# Patient Record
Sex: Female | Born: 1960
Health system: Southern US, Community
[De-identification: ages and names within clinical notes are randomized; demographics above are authoritative.]

## PROBLEM LIST (undated history)

## (undated) DIAGNOSIS — F419 Anxiety disorder, unspecified: Secondary | ICD-10-CM

## (undated) DIAGNOSIS — F502 Bulimia nervosa: Secondary | ICD-10-CM

## (undated) DIAGNOSIS — E785 Hyperlipidemia, unspecified: Secondary | ICD-10-CM

## (undated) DIAGNOSIS — G43909 Migraine, unspecified, not intractable, without status migrainosus: Secondary | ICD-10-CM

## (undated) DIAGNOSIS — K589 Irritable bowel syndrome without diarrhea: Secondary | ICD-10-CM

## (undated) DIAGNOSIS — K259 Gastric ulcer, unspecified as acute or chronic, without hemorrhage or perforation: Secondary | ICD-10-CM

## (undated) DIAGNOSIS — F329 Major depressive disorder, single episode, unspecified: Secondary | ICD-10-CM

## (undated) DIAGNOSIS — Z9889 Other specified postprocedural states: Secondary | ICD-10-CM

## (undated) DIAGNOSIS — B9681 Helicobacter pylori [H. pylori] as the cause of diseases classified elsewhere: Secondary | ICD-10-CM

## (undated) HISTORY — DX: Gastric ulcer, unspecified as acute or chronic, without hemorrhage or perforation: K25.9

## (undated) HISTORY — DX: Migraine, unspecified, not intractable, without status migrainosus: G43.909

## (undated) HISTORY — PX: ESOPHAGOGASTRODUODENOSCOPY: SHX1529

## (undated) HISTORY — PX: COLONOSCOPY: SHX174

## (undated) HISTORY — DX: Major depressive disorder, single episode, unspecified: F32.9

## (undated) HISTORY — DX: Other specified postprocedural states: Z98.890

## (undated) HISTORY — PX: ABDOMINAL HYSTERECTOMY: SHX81

## (undated) HISTORY — DX: Hyperlipidemia, unspecified: E78.5

## (undated) HISTORY — DX: Helicobacter pylori (H. pylori) as the cause of diseases classified elsewhere: B96.81

## (undated) HISTORY — DX: Irritable bowel syndrome without diarrhea: K58.9

## (undated) HISTORY — DX: Bulimia nervosa: F50.2

## (undated) HISTORY — DX: Anxiety disorder, unspecified: F41.9

## (undated) HISTORY — DX: Irritable bowel syndrome, unspecified: K58.9

---

## 1995-11-30 DIAGNOSIS — F419 Anxiety disorder, unspecified: Secondary | ICD-10-CM

## 1995-11-30 DIAGNOSIS — F32A Depression, unspecified: Secondary | ICD-10-CM

## 1995-11-30 DIAGNOSIS — F502 Bulimia nervosa, unspecified: Secondary | ICD-10-CM

## 1995-11-30 HISTORY — DX: Depression, unspecified: F32.A

## 1995-11-30 HISTORY — DX: Bulimia nervosa: F50.2

## 1995-11-30 HISTORY — DX: Anxiety disorder, unspecified: F41.9

## 1995-11-30 HISTORY — DX: Bulimia nervosa, unspecified: F50.20

## 2004-01-11 ENCOUNTER — Emergency Department (HOSPITAL_COMMUNITY): Admission: EM | Admit: 2004-01-11 | Discharge: 2004-01-11 | Payer: Self-pay | Admitting: Emergency Medicine

## 2006-01-13 ENCOUNTER — Ambulatory Visit: Payer: Self-pay | Admitting: Obstetrics and Gynecology

## 2006-02-03 ENCOUNTER — Ambulatory Visit: Payer: Self-pay | Admitting: Obstetrics and Gynecology

## 2007-05-29 ENCOUNTER — Other Ambulatory Visit: Payer: Self-pay

## 2007-05-29 ENCOUNTER — Emergency Department: Payer: Self-pay | Admitting: Emergency Medicine

## 2007-07-21 LAB — HM PAP SMEAR: HM Pap smear: NORMAL

## 2007-09-24 ENCOUNTER — Emergency Department: Payer: Self-pay | Admitting: Emergency Medicine

## 2008-01-06 ENCOUNTER — Emergency Department: Payer: Self-pay | Admitting: Emergency Medicine

## 2008-07-21 ENCOUNTER — Emergency Department: Payer: Self-pay | Admitting: Emergency Medicine

## 2008-07-21 ENCOUNTER — Other Ambulatory Visit: Payer: Self-pay

## 2009-07-04 LAB — HM COLONOSCOPY: HM Colonoscopy: NORMAL

## 2010-11-29 DIAGNOSIS — Z9889 Other specified postprocedural states: Secondary | ICD-10-CM

## 2010-11-29 DIAGNOSIS — B9681 Helicobacter pylori [H. pylori] as the cause of diseases classified elsewhere: Secondary | ICD-10-CM

## 2010-11-29 HISTORY — DX: Gastric ulcer, unspecified as acute or chronic, without hemorrhage or perforation: B96.81

## 2010-11-29 HISTORY — DX: Other specified postprocedural states: Z98.890

## 2011-02-03 ENCOUNTER — Inpatient Hospital Stay: Payer: Self-pay | Admitting: Internal Medicine

## 2011-07-21 LAB — HM MAMMOGRAPHY: HM Mammogram: NORMAL

## 2011-08-24 ENCOUNTER — Ambulatory Visit: Payer: Self-pay | Admitting: Internal Medicine

## 2011-09-29 ENCOUNTER — Encounter: Payer: Self-pay | Admitting: Internal Medicine

## 2011-09-29 ENCOUNTER — Ambulatory Visit (INDEPENDENT_AMBULATORY_CARE_PROVIDER_SITE_OTHER): Payer: PRIVATE HEALTH INSURANCE | Admitting: Internal Medicine

## 2011-09-29 DIAGNOSIS — K589 Irritable bowel syndrome without diarrhea: Secondary | ICD-10-CM

## 2011-09-29 DIAGNOSIS — Z9889 Other specified postprocedural states: Secondary | ICD-10-CM

## 2011-09-29 DIAGNOSIS — Z8711 Personal history of peptic ulcer disease: Secondary | ICD-10-CM | POA: Insufficient documentation

## 2011-09-29 DIAGNOSIS — E669 Obesity, unspecified: Secondary | ICD-10-CM

## 2011-09-29 DIAGNOSIS — R5381 Other malaise: Secondary | ICD-10-CM

## 2011-09-29 DIAGNOSIS — B9681 Helicobacter pylori [H. pylori] as the cause of diseases classified elsewhere: Secondary | ICD-10-CM | POA: Insufficient documentation

## 2011-09-29 DIAGNOSIS — G43909 Migraine, unspecified, not intractable, without status migrainosus: Secondary | ICD-10-CM | POA: Insufficient documentation

## 2011-09-29 DIAGNOSIS — R5383 Other fatigue: Secondary | ICD-10-CM

## 2011-09-29 DIAGNOSIS — Z13220 Encounter for screening for lipoid disorders: Secondary | ICD-10-CM

## 2011-09-29 DIAGNOSIS — R232 Flushing: Secondary | ICD-10-CM

## 2011-09-29 DIAGNOSIS — Z8719 Personal history of other diseases of the digestive system: Secondary | ICD-10-CM | POA: Insufficient documentation

## 2011-09-29 DIAGNOSIS — N951 Menopausal and female climacteric states: Secondary | ICD-10-CM

## 2011-09-29 MED ORDER — ESTRADIOL 0.5 MG PO TABS: 0.5000 mg | ORAL_TABLET | Freq: Every day | ORAL | Status: DC

## 2011-09-29 NOTE — Progress Notes (Signed)
  Subjective:    Patient ID: Nichole Riddle, female    DOB: 11-13-1961, 50 y.o.   MRN: [id number]  HPI  50 yo white female with a history of hot flashes, ep[isodic, first occurring in her early 23s presents with 6 month history of hot flashes and night sweatswhich are accompaned by nausea and sleep disruption ,  Hot flashes are preceded by intense feeling of cold down to the bone. History of menstrual irregularity, had a hysterectomy to manage the symptoms and the resulting anemia.  Mother had hot flashes in her 30s as well. She has had several episodes over the last 30 yrs lasting moths at a time and then resolving spontaneously.  She reports frequent swings andrecurrence of migraine headaches and  was previously treated with celexa but not currently.   Past Medical History  Diagnosis Date  . Gastric ulcer 2012    NSAID induced   . History of cardiac catheterization 2012    normal   . Gastric ulcer due to Helicobacter pylori 2012    and NSAID use    No current outpatient prescriptions on file prior to visit.     Review of Systems  Constitutional: Negative for fever, chills and unexpected weight change.  HENT: Negative for hearing loss, ear pain, nosebleeds, congestion, sore throat, facial swelling, rhinorrhea, sneezing, mouth sores, trouble swallowing, neck pain, neck stiffness, voice change, postnasal drip, sinus pressure, tinnitus and ear discharge.   Eyes: Negative for pain, discharge, redness and visual disturbance.  Respiratory: Negative for cough, chest tightness, shortness of breath, wheezing and stridor.   Cardiovascular: Negative for chest pain, palpitations and leg swelling.  Musculoskeletal: Negative for myalgias and arthralgias.  Skin: Negative for color change and rash.  Neurological: Negative for dizziness, weakness, light-headedness and headaches.  Hematological: Negative for adenopathy.  Psychiatric/Behavioral: Positive for sleep disturbance, dysphoric mood and decreased  concentration.       Objective:   Physical Exam  Constitutional: She is oriented to person, place, and time. She appears well-developed and well-nourished.  HENT:  Mouth/Throat: Oropharynx is clear and moist.  Eyes: EOM are normal. Pupils are equal, round, and reactive to light. No scleral icterus.  Neck: Normal range of motion. Neck supple. No JVD present. No thyromegaly present.  Cardiovascular: Normal rate, regular rhythm, normal heart sounds and intact distal pulses.   Pulmonary/Chest: Effort normal and breath sounds normal.  Abdominal: Soft. Bowel sounds are normal. She exhibits no mass. There is no tenderness.  Musculoskeletal: Normal range of motion. She exhibits no edema.  Lymphadenopathy:    She has no cervical adenopathy.  Neurological: She is alert and oriented to person, place, and time.  Skin: Skin is warm and dry.  Psychiatric: She has a normal mood and affect.          Assessment & Plan:

## 2011-09-30 ENCOUNTER — Other Ambulatory Visit (INDEPENDENT_AMBULATORY_CARE_PROVIDER_SITE_OTHER): Payer: PRIVATE HEALTH INSURANCE | Admitting: *Deleted

## 2011-09-30 DIAGNOSIS — N951 Menopausal and female climacteric states: Secondary | ICD-10-CM

## 2011-09-30 DIAGNOSIS — Z13220 Encounter for screening for lipoid disorders: Secondary | ICD-10-CM

## 2011-09-30 DIAGNOSIS — R5381 Other malaise: Secondary | ICD-10-CM

## 2011-09-30 DIAGNOSIS — R5383 Other fatigue: Secondary | ICD-10-CM

## 2011-09-30 LAB — LUTEINIZING HORMONE: LH: 54.63 m[IU]/mL

## 2011-09-30 LAB — LIPID PANEL
Total CHOL/HDL Ratio: 5
Triglycerides: 96 mg/dL (ref 0.0–149.0)

## 2011-09-30 LAB — COMPREHENSIVE METABOLIC PANEL
ALT: 20 U/L (ref 0–35)
AST: 27 U/L (ref 0–37)
Albumin: 4.4 g/dL (ref 3.5–5.2)
Alkaline Phosphatase: 72 U/L (ref 39–117)
BUN: 9 mg/dL (ref 6–23)
Potassium: 4.2 mEq/L (ref 3.5–5.1)
Sodium: 140 mEq/L (ref 135–145)
Total Protein: 7.9 g/dL (ref 6.0–8.3)

## 2011-09-30 LAB — TSH: TSH: 1.64 u[IU]/mL (ref 0.35–5.50)

## 2011-09-30 LAB — FOLLICLE STIMULATING HORMONE: FSH: 123.8 m[IU]/mL

## 2011-10-02 ENCOUNTER — Encounter: Payer: Self-pay | Admitting: Internal Medicine

## 2011-10-02 DIAGNOSIS — E669 Obesity, unspecified: Secondary | ICD-10-CM | POA: Insufficient documentation

## 2011-10-02 DIAGNOSIS — Z780 Asymptomatic menopausal state: Secondary | ICD-10-CM | POA: Insufficient documentation

## 2011-10-02 NOTE — Assessment & Plan Note (Signed)
Etiology is most likely ovarian failure, since she has not had weight loss, respiratory symptoms,  Post prandial flushing, and lympadenopathy not noted on exam.  Will check thyroid and hormone levels.  Trial of estradiol since she is absolutely miserable.  She is s/p hysterectomy.

## 2011-10-02 NOTE — Assessment & Plan Note (Signed)
Will screen for diabetes and hyperlipidemia.

## 2011-10-04 MED ORDER — ROSUVASTATIN CALCIUM 10 MG PO TABS: 10.0000 mg | ORAL_TABLET | Freq: Every day | ORAL | Status: DC

## 2011-10-04 NOTE — Progress Notes (Signed)
Addended by: Darletta Moll A on: 10/04/2011 09:21 AM   Modules accepted: Orders

## 2011-10-12 ENCOUNTER — Telehealth: Payer: Self-pay | Admitting: Internal Medicine

## 2011-10-12 DIAGNOSIS — G4700 Insomnia, unspecified: Secondary | ICD-10-CM

## 2011-10-12 DIAGNOSIS — E785 Hyperlipidemia, unspecified: Secondary | ICD-10-CM

## 2011-10-12 MED ORDER — ATORVASTATIN CALCIUM 40 MG PO TABS: 40.0000 mg | ORAL_TABLET | Freq: Every day | ORAL | Status: DC

## 2011-10-12 MED ORDER — ESZOPICLONE 3 MG PO TABS: 3.0000 mg | ORAL_TABLET | Freq: Every evening | ORAL | Status: DC | PRN

## 2011-10-12 NOTE — Telephone Encounter (Signed)
Patient called and stated the cholesterol medication you called in for her is too expensive.  She wanted to know if something else could be called in.  She also wanted to know if you could prescribe Lunesta to help her sleep.  She stated she has tried Ambien but it does not work for her.  Please advise.

## 2011-10-12 NOTE — Telephone Encounter (Signed)
We can try lipitor, which is going generic soon but with an LDL of 300 there are no available cheap generic statins that will lower her LDL to an acceptable range of 160 or less. .  The Nichole Riddle has been written,  It is expensive too.

## 2011-10-13 MED ORDER — SIMVASTATIN 40 MG PO TABS: 40.0000 mg | ORAL_TABLET | Freq: Every evening | ORAL | Status: DC

## 2011-10-13 NOTE — Telephone Encounter (Signed)
Simvastatin 40 mg one tablet daily  #90 2 refills.

## 2011-10-13 NOTE — Telephone Encounter (Signed)
Spoke w/patient - she does not want lunesta due to cost. She has taken lipitor in the past and had severe flu-like symptoms. Only other med she has tried was simvastatin which caused slight stomach upset but she is willing to try again. Please advise.

## 2011-10-13 NOTE — Telephone Encounter (Signed)
Left detailed VM on pt's cell, RX sent in, med list updated

## 2011-11-24 ENCOUNTER — Telehealth: Payer: Self-pay | Admitting: Internal Medicine

## 2011-11-24 NOTE — Telephone Encounter (Signed)
patient is having trouble sleeping wanted a prescription for zanax called in, she uses [facility] in [place]   By Lysbeth Galas     See above - Do you need to see pt for OV?

## 2011-11-24 NOTE — Telephone Encounter (Signed)
I do not prescribe controlled substances for the first time over the phone .  Seh will need to make an appt

## 2011-11-25 ENCOUNTER — Telehealth: Payer: Self-pay | Admitting: Internal Medicine

## 2011-11-25 NOTE — Telephone Encounter (Signed)
Message left notifying patient that she would need an appt for the testing and in order to get controlled substance. Advised to call and schedule.

## 2011-11-25 NOTE — Telephone Encounter (Signed)
Disregard the xanax. I just saw your previous note. However, will she need appt or just a lab visit for the other?

## 2011-11-25 NOTE — Telephone Encounter (Signed)
Left detailed VM on cell that she needs OV

## 2011-11-25 NOTE — Telephone Encounter (Signed)
She will need an appt.

## 2011-11-25 NOTE — Telephone Encounter (Signed)
OK to refill xanax? I did not see it on her med list. Ok to schedule lab appt only or do you want to see patient prior?

## 2011-12-08 ENCOUNTER — Ambulatory Visit: Payer: PRIVATE HEALTH INSURANCE | Admitting: Internal Medicine

## 2012-01-24 ENCOUNTER — Encounter: Payer: PRIVATE HEALTH INSURANCE | Admitting: Internal Medicine

## 2012-02-16 ENCOUNTER — Telehealth: Payer: Self-pay | Admitting: *Deleted

## 2012-02-16 NOTE — Telephone Encounter (Signed)
Triage Record Num: [id number] Operator: Tomasita Crumble Patient Name: Nichole Riddle Call Date & Time: 02/15/2012 5:01:17PM Patient Phone: [phone] PCP: Duncan Dull Patient Gender: Female PCP Fax : [phone] Patient DOB: 1961-03-29 Practice Name: [facility] Reason for Call: Caller: [name]/Patient; PCP: Duncan Dull; CB#: [phone]; Call regarding Anxiety; Chest tightness, anxious. Onset estimated 01/18/12. Relates feeling tired/ depressed related to her current situation. Emergent sx ruled out. Advised see provider in 24 hours per Depression protocol. Parameters for callback and home care for the interim. Unable to access appointments in Epic. Advised to follow up with office on 02/16/12.Jethro Bolus agreed. Protocol(s) Used: Anxiety Protocol(s) Used: Depression Recommended Outcome per Protocol: See Provider within 24 hours Reason for Outcome: Describing symptoms of depression Increasing feelings of despondency and hopelessness Care Advice: ~ Another adult should drive. ~ Should not be alone, arrange for support (family member, friend, etc.). ~ Remove dangerous objects and other suicidal means (i.e., drugs, etc.) from patient's access. Avoid the use of stimulants including caffeine (coffee, some soft drinks, some energy drinks, tea and chocolate), cocaine, and amphetamines. Also avoid drinking alcohol. ~ ~ List, or take, all current prescription(s), nonprescription or alternative medication(s) to provider for evaluation. ~ Call provider or mental healthcare provider if symptoms worsen or new symptoms develop. 03/

## 2012-02-17 ENCOUNTER — Encounter: Payer: Self-pay | Admitting: Internal Medicine

## 2012-02-17 ENCOUNTER — Ambulatory Visit (INDEPENDENT_AMBULATORY_CARE_PROVIDER_SITE_OTHER): Payer: PRIVATE HEALTH INSURANCE | Admitting: Internal Medicine

## 2012-02-17 VITALS — BP 110/60 | HR 90 | Temp 97.9°F | Resp 16 | Wt 168.0 lb

## 2012-02-17 DIAGNOSIS — N951 Menopausal and female climacteric states: Secondary | ICD-10-CM

## 2012-02-17 DIAGNOSIS — R232 Flushing: Secondary | ICD-10-CM

## 2012-02-17 DIAGNOSIS — E785 Hyperlipidemia, unspecified: Secondary | ICD-10-CM

## 2012-02-17 DIAGNOSIS — F410 Panic disorder [episodic paroxysmal anxiety] without agoraphobia: Secondary | ICD-10-CM | POA: Insufficient documentation

## 2012-02-17 MED ORDER — CLONAZEPAM 1 MG PO TABS: 1.0000 mg | ORAL_TABLET | Freq: Two times a day (BID) | ORAL | Status: DC | PRN
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
## 2012-02-17 NOTE — Assessment & Plan Note (Addendum)
Clonazepam  discussed as an alternative to requested ativan due to cost.  Xanax which was not succesful in symptoms in the past .  We discussed a trial of Celexa for daily use if her need to use the clonazepam increased to twice daily. I have asked her to return in one month.

## 2012-02-20 ENCOUNTER — Encounter: Payer: Self-pay | Admitting: Internal Medicine

## 2012-02-20 DIAGNOSIS — E785 Hyperlipidemia, unspecified: Secondary | ICD-10-CM | POA: Insufficient documentation

## 2012-02-20 NOTE — Assessment & Plan Note (Signed)
Secondary to menopause her last visits hormonal evaluation.  Estradiol was prescribed but not taken the patient

## 2012-02-20 NOTE — Assessment & Plan Note (Addendum)
Fasting lipids were abnormal in November , at which time her LDL was 302 .   She was prescribed a statin and asked to return in 6 weeks for repeat fasting lipid and CMET which she has not done.  She did not start the medication.  We discussed the risks and benefits of medication versus untreated hyperlipidemia. She will consider resuming therapy

## 2012-02-22 ENCOUNTER — Telehealth: Payer: Self-pay | Admitting: Internal Medicine

## 2012-02-22 NOTE — Telephone Encounter (Signed)
Caller: Cyndi/Patient; PCP: Duncan Dull; CB#: [phone]; ; ; Call regarding Cellulitis in Breast and Flu Sx.  Patient states she developed tender, "tight" "hot" area on left breast. Onset 02/21/12. States area is swollen. States "extremely hot to the touch and real, real tight." States area extends fro axilla to breast bone over entire portion of lower breast. Afebrile. Patient took Ibuprofen 400mg . @ 0630 02/22/11. Patient also states she developed cough, head and nasal congestion. Onset 02/19/12. States initally had generalized aching,  fever 101-102 X 24 hours. Afebrile 02/22/12. Triage per Breast Sx and Flu-Like Sx. Protocol. No emergent sx identified. Care advice given per guidelines. Patient advised warm compresses to breast, Ibuprofen q 6 ours, take with food. Patient advised to monitor temperature. Patient advised increased fluids, warm fluids with honey, saline nasal washes, netty pot. Patient advised inhaled steam, humidifier. Call back parameters reviewed. Patient verbalizes understanding. No appts. available in Epic. RN spoke with in office. Informed of above. Orders received: Patient to be seen in UC/ED.  Patient informed of above. Patient advised not to drive self. Patient verbalizes understanding and agreeable. States prefers to go to [facility] for evaluation.

## 2012-02-23 NOTE — Telephone Encounter (Signed)
[facility] Triage Call Report Triage Record Num: [id number] Operator: Patriciaann Clan Patient Name: Nichole Riddle Call Date & Time: 02/22/2012 10:20:34AM Patient Phone: [phone] PCP: Duncan Dull Patient Gender: Female PCP Fax : [phone] Patient DOB: May 09, 1961 Practice Name: [facility] Reason for Call: Caller: Kimia/Patient; PCP: Duncan Dull; CB#: [phone]; ; ; Call regarding Cellulitis in Breast and Flu Sx. Patient states she developed tender, "tight" "hot" area on left breast. Onset 02/21/12. States area is swollen. States "extremely hot to the touch and real, real tight." States area extends fro axilla to breast bone over entire portion of lower breast. Afebrile. Patient took Ibuprofen 400mg . @ 0630 02/22/11. Patient also states she developed cough, head and nasal congestion. Onset 02/19/12. States initally had generalized aching, fever 101-102 X 24 hours. Afebrile 02/22/12. Triage per Breast Sx and Flu-Like Sx. Protocol. No emergent sx identified. Care advice given per guidelines. Patient advised warm compresses to breast, Ibuprofen q 6 ours, take with food. Patient advised to monitor temperature. Patient advised increased fluids, warm fluids with honey, saline nasal washes, netty pot. Patient advised inhaled steam, humidifier. Call back parameters reviewed. Patient verbalizes understanding. No appts. available in Epic. RN spoke with in office. Informed of above. Orders received: Patient to be seen in UC/ED. Patient informed of above. Patient advised not to drive self. Patient verbalizes understanding and agreeable. States prefers to go to [facility] for evaluation. Protocol(s) Used: Breast Symptoms - Female Recommended Outcome per Protocol: See Provider within 24 hours Override Outcome if Used in Protocol: See ED Immediately RN Reason for Override Outcome: Physician Orders. Reason for Outcome: Hard, tender or red lump on breast Care  Advice: ~ Apply warm soaks to the affected breast(s) for 20 to 30 minutes 3 or 4 times a day. ~ Call provider if symptoms worsen or new symptoms develop. ~ SYMPTOM / CONDITION MANAGEMENT Analgesic/Antipyretic Advice - NSAIDs: Consider aspirin, ibuprofen, naproxen or ketoprofen for pain or fever as directed on label or by pharmacist/provider. PRECAUTIONS: - If over 2 years of age, should not take longer than 1 week without consulting provider. EXCEPTIONS: - Should not be used if taking blood thinners or have bleeding problems. - Do not use if have history of sensitivity/allergy to any of these medications; or history of cardiovascular, ulcer, kidney, liver disease or diabetes unless approved by provider. - Do not exceed recommended dose or frequency. ~ 02/22/2012 10:47:56AM Page 1 of 1 [email or web address] [facility] Triage Call Report Triage Record Num: [id number] Operator: Patriciaann Clan Patient Name: Nichole Riddle Call Date & Time: 02/22/2012 10:20:34AM Patient Phone: [phone] PCP: Duncan Dull Patient Gender: Female PCP Fax : [phone] Patient DOB: 08-13-1961 Practice Name: [facility] Reason for Call: Caller: Ericca/Patient; PCP: Duncan Dull; CB#: [phone]; ; ; Call regarding Cellulitis in Breast and Flu Sx. Patient states she developed tender, "tight" "hot" area on left breast. Onset 02/21/12. States area is swollen. States "extremely hot to the touch and real, real tight." States area extends fro axilla to breast bone over entire portion of lower breast. Afebrile. Patient took Ibuprofen 400mg . @ 0630 02/22/11. Patient also states she developed cough, head and nasal congestion. Onset 02/19/12. States initally had generalized aching, fever 101-102 X 24 hours. Afebrile 02/22/12. Triage per Breast Sx and Flu-Like Sx. Protocol. No emergent sx identified. Care advice given per guidelines. Patient advised warm compresses to breast, Ibuprofen q 6 ours, take  with food. Patient advised to monitor temperature. Patient advised increased fluids, warm fluids with  honey, saline nasal washes, netty pot. Patient advised inhaled steam, humidifier. Call back parameters reviewed. Patient verbalizes understanding. No appts. available in Epic. RN spoke with in office. Informed of above. Orders received: Patient to be seen in UC/ED. Patient informed of above. Patient advised not to drive self. Patient verbalizes understanding and agreeable. States prefers to go to [facility] for evaluation. Protocol(s) Used: Flu-Like Symptoms Recommended Outcome per Protocol: Provide Home/Self Care Reason for Outcome: Flu-like illness AND not in a high risk group Care Advice: ~ Use a cool mist humidifier to moisten air. Be sure to clean according to manufacturer's instructions. ~ If you can, stop smoking now and avoid all secondhand smoke. ~ INFECTION CONTROL Call provider immediately if develop a severe productive cough, fever over 101.5 F (38.6 C), and sharp pain when coughing. ~ Most adults need to drink 6-10 eight-ounce glasses (1.2-2.0 liters) of fluids per day unless previously told to limit fluid intake for other medical reasons. Limit fluids that contain caffeine, sugar or alcohol. Urine will be a very light yellow color when you drink enough fluids. ~ See a provider immediately or go to the Emergency Department if having chest pain with breathing, change in level of consciousness, new seizure, vomiting and unable to keep fluids down for 8 hours or more, or has not urinated for 8 or more hours. ~ ~ If alone, have someone check in with you several times a day while you are feeling ill. 02/22/2012 10:47:58AM Page 1 of 1 [email or web address]

## 2012-03-01 ENCOUNTER — Ambulatory Visit: Payer: Self-pay | Admitting: Family Medicine

## 2012-03-30 ENCOUNTER — Telehealth: Payer: Self-pay | Admitting: Internal Medicine

## 2012-03-30 MED ORDER — DOXYCYCLINE HYCLATE 100 MG PO TABS: 100.0000 mg | ORAL_TABLET | Freq: Two times a day (BID) | ORAL | Status: AC

## 2012-03-30 NOTE — Telephone Encounter (Signed)
I talked to Dr. Dan Humphreys and she wants to start patient on Doxycycline.  Rx has been called in and scheduled an appt with Dr. Darrick Huntsman tomorrow.  Patient notified.

## 2012-03-30 NOTE — Telephone Encounter (Signed)
Patient pulled a tick off seven days ago no she has developed a rash where the tic was running a low grade fever this morning.

## 2012-03-31 ENCOUNTER — Ambulatory Visit (INDEPENDENT_AMBULATORY_CARE_PROVIDER_SITE_OTHER): Payer: PRIVATE HEALTH INSURANCE | Admitting: Internal Medicine

## 2012-03-31 VITALS — BP 98/70 | HR 73 | Temp 98.0°F | Resp 16 | Ht 63.0 in | Wt 167.0 lb

## 2012-03-31 DIAGNOSIS — T148 Other injury of unspecified body region: Secondary | ICD-10-CM

## 2012-03-31 DIAGNOSIS — L03119 Cellulitis of unspecified part of limb: Secondary | ICD-10-CM

## 2012-03-31 DIAGNOSIS — W57XXXA Bitten or stung by nonvenomous insect and other nonvenomous arthropods, initial encounter: Secondary | ICD-10-CM

## 2012-03-31 DIAGNOSIS — T148XXA Other injury of unspecified body region, initial encounter: Secondary | ICD-10-CM

## 2012-03-31 DIAGNOSIS — L02419 Cutaneous abscess of limb, unspecified: Secondary | ICD-10-CM

## 2012-03-31 MED ORDER — PROMETHAZINE HCL 25 MG PO TABS: 25.0000 mg | ORAL_TABLET | Freq: Three times a day (TID) | ORAL | Status: DC | PRN

## 2012-03-31 MED ORDER — CEPHALEXIN 500 MG PO CAPS: 500.0000 mg | ORAL_CAPSULE | Freq: Three times a day (TID) | ORAL | Status: AC

## 2012-04-02 ENCOUNTER — Encounter: Payer: Self-pay | Admitting: Internal Medicine

## 2012-04-02 DIAGNOSIS — W57XXXA Bitten or stung by nonvenomous insect and other nonvenomous arthropods, initial encounter: Secondary | ICD-10-CM | POA: Insufficient documentation

## 2012-04-02 DIAGNOSIS — L03119 Cellulitis of unspecified part of limb: Secondary | ICD-10-CM | POA: Insufficient documentation

## 2012-04-02 DIAGNOSIS — L02419 Cutaneous abscess of limb, unspecified: Secondary | ICD-10-CM | POA: Insufficient documentation

## 2012-04-02 NOTE — Progress Notes (Signed)
Patient ID: Nichole Riddle, female   DOB: 04/24/1961, 51 y.o.   MRN: [id number]  Patient Active Problem List  Diagnoses  . Headache, migraine  . History of colonoscopy  . Irritable bowel syndrome (IBS)  . Gastric ulcer  . History of cardiac catheterization  . Gastric ulcer due to Helicobacter pylori  . Hot flashes  . Obesity  . Anxiety attack  . Hyperlipidemia LDL goal < 130  . Cellulitis and abscess of leg    Subjective:  CC:   No chief complaint on file.   HPI:   Nichole Grout Smithis a 50 y.o. female who presents or erythema surrounding a tick bite she sustained one week ago  ,  She started doxycyline 36 hours ago but threw up her last dose.  She was taking it on an empty stomach bc the bottle said to.  The tick bit has developed a margin of erythema about  1 cm in width.  Denies fevers, headaches, pruritus .   Past Medical History  Diagnosis Date  . Gastric ulcer 2012    NSAID induced   . History of cardiac catheterization 2012    normal   . Gastric ulcer due to Helicobacter pylori 2012    and NSAID use    History reviewed. No pertinent past surgical history.       The following portions of the patient's history were reviewed and updated as appropriate: Allergies, current medications, and problem list.    Review of Systems:   12 Pt  review of systems was negative except those addressed in the HPI,     History   Social History  . Marital Status: Single    Spouse Name: N/A    Number of Children: N/A  . Years of Education: N/A   Occupational History  . nurses aide     Hospice   Social History Main Topics  . Smoking status: Never Smoker   . Smokeless tobacco: Never Used  . Alcohol Use: Yes  . Drug Use: No  . Sexually Active: Not on file   Other Topics Concern  . Not on file   Social History Narrative  . No narrative on file    Objective:  BP 98/70  Pulse 73  Temp 98 F (36.7 C)  Resp 16  Ht 5\' 3"  (1.6 m)  Wt 167 lb (75.751 kg)  BMI  29.58 kg/m2  SpO2 98%  General appearance: alert, cooperative and appears stated age Ears: normal TM's and external ear canals both ears Throat: lips, mucosa, and tongue normal; teeth and gums normal Neck: no adenopathy, no carotid bruit, supple, symmetrical, trachea midline and thyroid not enlarged, symmetric, no tenderness/mass/nodules Back: symmetric, no curvature. ROM normal. No CVA tenderness. Lungs: clear to auscultation bilaterally Heart: regular rate and rhythm, S1, S2 normal, no murmur, click, rub or gallop Abdomen: soft, non-tender; bowel sounds normal; no masses,  no organomegaly Pulses: 2+ and symmetric Skin: erythematous rash with purple center left leg.  Lymph nodes: Cervical, supraclavicular, and axillary nodes normal.  Assessment and Plan:  Cellulitis and abscess of leg Adding keflex to doxycycline for coverage of staph and strep.  Tick bite Continue empiric doxycycline  For prevention of RMSF     Updated Medication List Outpatient Encounter Prescriptions as of 03/31/2012  Medication Sig Dispense Refill  . cephALEXin (KEFLEX) 500 MG capsule Take 1 capsule (500 mg total) by mouth 3 (three) times daily.  21 capsule  0  . doxycycline (VIBRA-TABS) 100 MG tablet  Take 1 tablet (100 mg total) by mouth 2 (two) times daily.  20 tablet  0  . promethazine (PHENERGAN) 25 MG tablet Take 1 tablet (25 mg total) by mouth every 8 (eight) hours as needed for nausea.  30 tablet  0

## 2012-04-02 NOTE — Assessment & Plan Note (Signed)
Continue empiric doxycycline  For prevention of RMSF

## 2012-04-02 NOTE — Assessment & Plan Note (Signed)
Adding keflex to doxycycline for coverage of staph and strep.

## 2012-04-03 ENCOUNTER — Telehealth: Payer: Self-pay | Admitting: Internal Medicine

## 2012-04-03 NOTE — Telephone Encounter (Signed)
She does not need to stay home for a tick bite.

## 2012-04-03 NOTE — Telephone Encounter (Signed)
Note printed to excuse her from work on Friday. Left message notifying patient that she can return to work today.

## 2012-04-03 NOTE — Telephone Encounter (Signed)
[phone] Pt would like to get a note for work for the Friday appointment pt is trying to go to work today or does she need to stay home Pt would like to pick this up today around 2:30 -3

## 2012-04-04 ENCOUNTER — Telehealth: Payer: Self-pay | Admitting: Internal Medicine

## 2012-04-04 NOTE — Telephone Encounter (Signed)
Patient is worn out and tired and her legs ache. Thinking she came back to work to soon. Not running a fever. Would like a call from the nurse. Patient was forwarded to [facility].

## 2012-04-28 ENCOUNTER — Ambulatory Visit: Payer: PRIVATE HEALTH INSURANCE | Admitting: Internal Medicine

## 2012-06-08 ENCOUNTER — Other Ambulatory Visit: Payer: Self-pay | Admitting: Internal Medicine

## 2012-06-08 DIAGNOSIS — R110 Nausea: Secondary | ICD-10-CM

## 2012-06-09 MED ORDER — PROMETHAZINE HCL 25 MG PO TABS: 25.0000 mg | ORAL_TABLET | Freq: Three times a day (TID) | ORAL | Status: DC | PRN

## 2012-06-09 NOTE — Addendum Note (Signed)
Addended by: Duncan Dull on: 06/09/2012 05:49 PM   Modules accepted: Orders

## 2012-06-30 ENCOUNTER — Ambulatory Visit: Payer: PRIVATE HEALTH INSURANCE | Admitting: Internal Medicine

## 2012-07-06 ENCOUNTER — Telehealth: Payer: Self-pay | Admitting: Internal Medicine

## 2012-07-06 NOTE — Telephone Encounter (Signed)
Caller: Nichole Riddle/Patient; Phone Number: [phone]; Message from caller: Patient is calling and stating she went to [facility] today for abdominal discomfort, vomiting, bloating.  She wanted to let Dr.  Darrick Huntsman know she was given nexium and she feels better She has an appt with Dr.  Darrick Huntsman next Friday 07/14/12.  They discussed with her being tested for H-pylori.  She does not needs assistance today.  She is updating staff on changes today.

## 2012-07-14 ENCOUNTER — Ambulatory Visit: Payer: PRIVATE HEALTH INSURANCE | Admitting: Internal Medicine

## 2012-07-20 ENCOUNTER — Encounter: Payer: Self-pay | Admitting: Internal Medicine

## 2012-07-20 ENCOUNTER — Ambulatory Visit (INDEPENDENT_AMBULATORY_CARE_PROVIDER_SITE_OTHER): Payer: PRIVATE HEALTH INSURANCE | Admitting: Internal Medicine

## 2012-07-20 VITALS — BP 118/70 | HR 78 | Temp 98.7°F | Resp 16 | Wt 168.3 lb

## 2012-07-20 DIAGNOSIS — R1310 Dysphagia, unspecified: Secondary | ICD-10-CM

## 2012-07-20 DIAGNOSIS — K5900 Constipation, unspecified: Secondary | ICD-10-CM

## 2012-07-20 DIAGNOSIS — R197 Diarrhea, unspecified: Secondary | ICD-10-CM

## 2012-07-20 DIAGNOSIS — K589 Irritable bowel syndrome without diarrhea: Secondary | ICD-10-CM

## 2012-07-20 LAB — COMPREHENSIVE METABOLIC PANEL
ALT: 20 U/L (ref 0–35)
AST: 22 U/L (ref 0–37)
Alkaline Phosphatase: 70 U/L (ref 39–117)
BUN: 6 mg/dL (ref 6–23)
Creatinine, Ser: 0.8 mg/dL (ref 0.4–1.2)
Total Bilirubin: 0.3 mg/dL (ref 0.3–1.2)

## 2012-07-20 LAB — TSH: TSH: 1.15 u[IU]/mL (ref 0.35–5.50)

## 2012-07-20 MED ORDER — LINACLOTIDE 145 MCG PO CAPS: 145.0000 ug | ORAL_CAPSULE | Freq: Every day | ORAL | Status: DC

## 2012-07-20 MED ORDER — OMEPRAZOLE 40 MG PO CPDR: 40.0000 mg | DELAYED_RELEASE_CAPSULE | Freq: Two times a day (BID) | ORAL | Status: DC

## 2012-07-20 NOTE — Progress Notes (Signed)
Patient ID: DAVEDA LAROCK, female   DOB: 1960-12-24, 51 y.o.   MRN: [id number]  Patient Active Problem List  Diagnosis  . Headache, migraine  . History of colonoscopy  . Gastric ulcer  . History of cardiac catheterization  . Hot flashes  . Obesity  . Hyperlipidemia LDL goal < 130  . Irritable bowel  . Dysphagia    Subjective:  CC:   Chief Complaint  Patient presents with  . Irritable Bowel Syndrome  . Bloated  . Choking    HPI:   Nichole S Smithis a 51 y.o. female who presents with dysphagia.  Symptoms started one year ago  ,  Started with eating chik fil a sandwiches .  Prior EGD in [place] 2 yrs ago  6 ulcers found, attributed to daily use of goody's powders.   One week ago had epigastric pain bloating and vomiting,  given rx for nexium but could not fill due to $$$$.  Taking Mylanta. Has had episodes of reflux which have increased in frequency.  She recently had an episode of dysphagia drinking tea  And soda .  2) bloating not controlled lately.  History of IBS.  Stools have been very dark/black,  Frequent  constipation despite using miralax,  Now using ex lax.,  Drinking plenty of water and fiber.     Past Medical History  Diagnosis Date  . Gastric ulcer 2012    NSAID induced   . History of cardiac catheterization 2012    normal   . Gastric ulcer due to Helicobacter pylori 2012    and NSAID use  . Irritable bowel     with alternating constipation/bloating and diarrhea    No past surgical history on file.       The following portions of the patient's history were reviewed and updated as appropriate: Allergies, current medications, and problem list.    Review of Systems:   12 Pt  review of systems was negative except those addressed in the HPI,     History   Social History  . Marital Status: Single    Spouse Name: N/A    Number of Children: N/A  . Years of Education: N/A   Occupational History  . nurses aide     Hospice   Social History Main  Topics  . Smoking status: Never Smoker   . Smokeless tobacco: Never Used  . Alcohol Use: Yes  . Drug Use: No  . Sexually Active: Not on file   Other Topics Concern  . Not on file   Social History Narrative  . No narrative on file    Objective:  BP 118/70  Pulse 78  Temp 98.7 F (37.1 C) (Oral)  Resp 16  Wt 168 lb 5 oz (76.346 kg)  SpO2 97%  General appearance: alert, cooperative and appears stated age Ears: normal TM's and external ear canals both ears Throat: lips, mucosa, and tongue normal; teeth and gums normal Neck: no adenopathy, no carotid bruit, supple, symmetrical, trachea midline and thyroid not enlarged, symmetric, no tenderness/mass/nodules Back: symmetric, no curvature. ROM normal. No CVA tenderness. Lungs: clear to auscultation bilaterally Heart: regular rate and rhythm, S1, S2 normal, no murmur, click, rub or gallop Abdomen: soft, non-tender; bowel sounds normal; no masses,  no organomegaly Pulses: 2+ and symmetric Skin: Skin color, texture, turgor normal. No rashes or lesions Lymph nodes: Cervical, supraclavicular, and axillary nodes normal.  Assessment and Plan:  Dysphagia History suggestive of stricture.  PPI,  Barium swallow and  GI evaluation recommended.   Irritable bowel Currently with constipation despite using Miralax   Trial of Linzess    Updated Medication List Outpatient Encounter Prescriptions as of 07/20/2012  Medication Sig Dispense Refill  . b complex vitamins tablet Take 1 tablet by mouth daily.      . clonazePAM (KLONOPIN) 1 MG tablet Take 1 tablet (1 mg total) by mouth 2 (two) times daily as needed for anxiety.  60 tablet  2  . promethazine (PHENERGAN) 25 MG tablet Take 1 tablet (25 mg total) by mouth every 8 (eight) hours as needed for nausea.  30 tablet  1  . Linaclotide (LINZESS) 145 MCG CAPS Take 1 capsule (145 mcg total) by mouth daily.  16 capsule  0  . omeprazole (PRILOSEC) 40 MG capsule Take 1 capsule (40 mg total) by mouth 2  (two) times daily before a meal.  60 capsule  2     Orders Placed This Encounter  Procedures  . HM MAMMOGRAPHY  . DG UGI W/High Density W/Kub  . HM PAP SMEAR  . Celiac Disease Ab Screen w/Rfx  . TSH  . Comprehensive metabolic panel  . Magnesium  . Ambulatory referral to Gastroenterology  . HM COLONOSCOPY    No Follow-up on file.

## 2012-07-20 NOTE — Patient Instructions (Addendum)
Try the Linzess 1 tablet daily for constipation.  You may want to use magnesium citrate first (do not leave home after drinking it)  U= barium study and GI eval to follow

## 2012-07-22 DIAGNOSIS — R1310 Dysphagia, unspecified: Secondary | ICD-10-CM | POA: Insufficient documentation

## 2012-07-22 NOTE — Assessment & Plan Note (Signed)
History suggestive of stricture.  PPI,  Barium swallow and GI evaluation recommended.

## 2012-07-22 NOTE — Assessment & Plan Note (Signed)
Currently with constipation despite using Miralax   Trial of Linzess

## 2012-07-24 LAB — CELIAC DISEASE AB SCREEN W/RFX
Antigliadin Abs, IgA: 4 units (ref 0–19)
IgA/Immunoglobulin A, Serum: 225 mg/dL (ref 91–414)
Transglutaminase IgA: <2 U/mL (ref 0–3)

## 2012-08-09 ENCOUNTER — Telehealth: Payer: Self-pay | Admitting: Internal Medicine

## 2012-08-09 NOTE — Telephone Encounter (Signed)
Caller: Curtistine/Patient; Phone: [phone]; Reason for Call: Patient request to speak with Dr.  Darrick Huntsman, regarding the medication Clonazepam for aniexty & depression.  States still having stomach issues, aniexty & depression has gotten worse.  Please call her back.  Thanks

## 2012-08-10 ENCOUNTER — Telehealth: Payer: Self-pay | Admitting: Internal Medicine

## 2012-08-10 MED ORDER — CITALOPRAM HYDROBROMIDE 10 MG PO TABS: 10.0000 mg | ORAL_TABLET | Freq: Every day | ORAL | Status: DC

## 2012-08-10 NOTE — Telephone Encounter (Signed)
i will send rx for citaloppram 10 mg daily to her pharmacy but she needs to make follow up to see me  Prior to any refills.

## 2012-08-10 NOTE — Telephone Encounter (Signed)
Pt dropped off flma paperwork.  In box

## 2012-08-10 NOTE — Telephone Encounter (Signed)
Left message asking patient to return my call.

## 2012-08-10 NOTE — Telephone Encounter (Signed)
Spoke with patient via telephone and she stated that Clonazepam is not working for her anymore, she stated that is calms her down but it doesn't seem to keep her calm.  She thinks that she needs a daily medication.  She says that she has been having crying spells over the simplest things and the medication seems to wipe her out.  It also causes stomach cramps.  She missed four hours of work on Monday and didn't go to work on yesterday.  She stated that she has taken Celexa in the past and it seemed to work well for her.  Please advise, uses [facility] [place].

## 2012-08-10 NOTE — Telephone Encounter (Signed)
Patient notified.  She made a follow up.

## 2012-08-14 NOTE — Telephone Encounter (Signed)
The FMLA paperwork was in a "done" pile of stuff this morning when I got here from [place].  Carollee Herter spoke with patient and she is going to come by the office to pick up a copy of it.

## 2012-08-14 NOTE — Telephone Encounter (Signed)
Pt called checking on status of flma paperwork [phone]

## 2012-08-16 ENCOUNTER — Other Ambulatory Visit: Payer: Self-pay | Admitting: Internal Medicine

## 2012-08-16 DIAGNOSIS — K219 Gastro-esophageal reflux disease without esophagitis: Secondary | ICD-10-CM | POA: Insufficient documentation

## 2012-08-16 DIAGNOSIS — R110 Nausea: Secondary | ICD-10-CM

## 2012-08-16 MED ORDER — PROMETHAZINE HCL 25 MG PO TABS: 25.0000 mg | ORAL_TABLET | Freq: Three times a day (TID) | ORAL | Status: DC | PRN

## 2012-09-06 ENCOUNTER — Ambulatory Visit: Payer: PRIVATE HEALTH INSURANCE | Admitting: Internal Medicine

## 2012-09-26 ENCOUNTER — Ambulatory Visit: Payer: PRIVATE HEALTH INSURANCE | Admitting: Internal Medicine

## 2012-10-08 ENCOUNTER — Other Ambulatory Visit: Payer: Self-pay | Admitting: Internal Medicine

## 2012-10-25 ENCOUNTER — Ambulatory Visit (INDEPENDENT_AMBULATORY_CARE_PROVIDER_SITE_OTHER): Payer: PRIVATE HEALTH INSURANCE | Admitting: Internal Medicine

## 2012-10-25 ENCOUNTER — Encounter: Payer: Self-pay | Admitting: Internal Medicine

## 2012-10-25 VITALS — BP 120/72 | HR 78 | Temp 98.0°F | Resp 12 | Ht 64.5 in | Wt 172.5 lb

## 2012-10-25 DIAGNOSIS — F32A Depression, unspecified: Secondary | ICD-10-CM

## 2012-10-25 DIAGNOSIS — F329 Major depressive disorder, single episode, unspecified: Secondary | ICD-10-CM

## 2012-10-25 DIAGNOSIS — E162 Hypoglycemia, unspecified: Secondary | ICD-10-CM

## 2012-10-25 DIAGNOSIS — F341 Dysthymic disorder: Secondary | ICD-10-CM

## 2012-10-25 DIAGNOSIS — R141 Gas pain: Secondary | ICD-10-CM

## 2012-10-25 DIAGNOSIS — R1310 Dysphagia, unspecified: Secondary | ICD-10-CM

## 2012-10-25 DIAGNOSIS — R140 Abdominal distension (gaseous): Secondary | ICD-10-CM

## 2012-10-25 DIAGNOSIS — R143 Flatulence: Secondary | ICD-10-CM

## 2012-10-25 DIAGNOSIS — G4700 Insomnia, unspecified: Secondary | ICD-10-CM

## 2012-10-25 LAB — BASIC METABOLIC PANEL
BUN: 8 mg/dL (ref 6–23)
CO2: 28 mEq/L (ref 19–32)
Calcium: 9 mg/dL (ref 8.4–10.5)
Creatinine, Ser: 0.7 mg/dL (ref 0.4–1.2)
Glucose, Bld: 84 mg/dL (ref 70–99)

## 2012-10-25 MED ORDER — ALPRAZOLAM 0.5 MG PO TABS: 0.5000 mg | ORAL_TABLET | Freq: Every evening | ORAL | Status: DC | PRN

## 2012-10-25 MED ORDER — CITALOPRAM HYDROBROMIDE 20 MG PO TABS: 20.0000 mg | ORAL_TABLET | Freq: Every day | ORAL | Status: DC

## 2012-10-25 NOTE — Progress Notes (Signed)
Patient ID: Nichole Riddle, female   DOB: 07-Mar-1961, 51 y.o.   MRN: [id number]  Patient Active Problem List  Diagnosis  . Headache, migraine  . History of colonoscopy  . History of gastric ulcer  . History of cardiac catheterization  . Hot flashes  . Obesity  . Hyperlipidemia LDL goal < 130  . Irritable bowel  . Dysphagia  . Abdominal bloating  . Anxiety and depression    Subjective:  CC:   Chief Complaint  Patient presents with  . Follow-up    HPI:   Nichole Rathgeber Smithis a 51 y.o. female who presents Follow up on multiple ongoing issues including anxiety and dysphagia.  She was referred to [facility] in September for evaluation of dysphagia with diagnostic EGD and screening interval (5 yr) colonoscopy but procedures were postponed due to coverage issues (patient had not met deductible so insurance would not cover the cost ).  It was rescheduled, but was then preempted by patient getting "the flu,"  She has a history of duodenal ulcer. She is requesting to reschedule with  Dr. Juanda Chance in [place] .  Her dysphagia is not occurring as frequently as before but has occurred twice  while swallowing meat.  She has eliminated  wheat but her abdominal bloating has not improved and she continues to have daily hot flashes.  Her night sweats  are disrupting her sleep.  She is perimenopausal. She has discontinued the omeprazole for unclear reasons.   She continues to have periodic constipation alternating with  diarrhea despite using Miralax daily.  Most of her anxiety has improved with alleviation of stressful situation at work. She is working at [employer] , loves it,  more calm environment.  2) celexa helping the anxiety but having trouble staying asleep.  Wants to try lunesta.     Past Medical History  Diagnosis Date  . Gastric ulcer 2012    NSAID induced   . History of cardiac catheterization 2012    normal   . Gastric ulcer due to Helicobacter pylori 2012    and NSAID use  . Irritable bowel       with alternating constipation/bloating and diarrhea    History reviewed. No pertinent past surgical history.       The following portions of the patient's history were reviewed and updated as appropriate: Allergies, current medications, and problem list.    Review of Systems:  Patient denies headache, fevers, malaise, unintentional weight loss, skin rash, eye pain, sinus congestion and sinus pain, sore throat, hemoptysis , cough, dyspnea, wheezing, chest pain, palpitations, orthopnea, edema, abdominal pain,  melena,  flank pain, dysuria, hematuria, urinary  Frequency, nocturia, numbness, tingling, seizures,  Focal weakness, Loss of consciousness,  Tremor, depression,  and suicidal ideation.       History   Social History  . Marital Status: Single    Spouse Name: N/A    Number of Children: N/A  . Years of Education: N/A   Occupational History  . nurses aide     Hospice   Social History Main Topics  . Smoking status: Never Smoker   . Smokeless tobacco: Never Used  . Alcohol Use: Yes  . Drug Use: No  . Sexually Active: Not on file   Other Topics Concern  . Not on file   Social History Narrative  . No narrative on file    Objective:  BP 120/72  Pulse 78  Temp 98 F (36.7 C) (Oral)  Resp 12  Ht  5' 4.5" (1.638 m)  Wt 172 lb 8 oz (78.245 kg)  BMI 29.15 kg/m2  SpO2 98%  General appearance: alert, cooperative and appears stated age Ears: normal TM's and external ear canals both ears Throat: lips, mucosa, and tongue normal; teeth and gums normal Neck: no adenopathy, no carotid bruit, supple, symmetrical, trachea midline and thyroid not enlarged, symmetric, no tenderness/mass/nodules Back: symmetric, no curvature. ROM normal. No CVA tenderness. Lungs: clear to auscultation bilaterally Heart: regular rate and rhythm, S1, S2 normal, no murmur, click, rub or gallop Abdomen: soft, non-tender; bowel sounds normal; no masses,  no organomegaly Pulses: 2+ and  symmetric Skin: Skin color, texture, turgor normal. No rashes or lesions Lymph nodes: Cervical, supraclavicular, and axillary nodes normal.  Assessment and Plan:  Abdominal bloating Be simply due to IBS but given her very mild menopausal state The possibility of ovarian cancer needs to be ruled out.  She has been referred for abdomen/prlvic CT  Dysphagia Likely due to stricture in occurrence with solid foods. She has been instructed to resume daily PPI and we will arrange referral to Dr. Juanda Chance for EGD and interval colonoscopy.  Anxiety and depression Symptoms have improved on Celexa and with change in arm at work however she is requesting increase in medication due to continued insomnia. We will increase the Celexa to 20 mg daily. Samples of lasted given.   Updated Medication List Outpatient Encounter Prescriptions as of 10/25/2012  Medication Sig Dispense Refill  . b complex vitamins tablet Take 1 tablet by mouth daily.      . citalopram (CELEXA) 20 MG tablet Take 1 tablet (20 mg total) by mouth daily.  90 tablet  1  . [DISCONTINUED] citalopram (CELEXA) 10 MG tablet TAKE 1 TABLET BY MOUTH EVERY DAY  30 tablet  1  . ALPRAZolam (XANAX) 0.5 MG tablet Take 1 tablet (0.5 mg total) by mouth at bedtime as needed for sleep.  30 tablet  3  . clonazePAM (KLONOPIN) 1 MG tablet Take 1 tablet (1 mg total) by mouth 2 (two) times daily as needed for anxiety.  60 tablet  2  . Linaclotide (LINZESS) 145 MCG CAPS Take 1 capsule (145 mcg total) by mouth daily.  16 capsule  0  . omeprazole (PRILOSEC) 40 MG capsule Take 1 capsule (40 mg total) by mouth 2 (two) times daily before a meal.  60 capsule  2  . promethazine (PHENERGAN) 25 MG tablet Take 1 tablet (25 mg total) by mouth every 8 (eight) hours as needed for nausea.  30 tablet  1     Orders Placed This Encounter  Procedures  . CT Abdomen Pelvis W Contrast  . CA 125  . Hemoglobin A1c  . Basic metabolic panel    No Follow-up on file.

## 2012-10-25 NOTE — Patient Instructions (Addendum)
Take the nexium or omeprazole 40 mg  In the morning 30 minutes prior to food or drink.  Referral for Nichole Riddle to do your EGD/colonoscopy  CT of abdomen and pelvis to investigate  The bloating (to look at ovaries)   We have increased your citalopram to 20 mg daily.   Try the lunesta first for the insomnia,  Followed by the alprazolam.   This is  my version of a  "Low GI"  Diet:  All of the foods can be found at grocery stores and in bulk at [employer].  The Atkins protein bars and shakes are available in more varieties at [employer], [employer] and [employer].     7 AM Breakfast:  Low carbohydrate Protein  Shakes (I recommend the [employer]AS AdvantEdge "Carb Control" shakes  Or the low carb shakes by Atkins.   Both are available everywhere:  In  cases at [employer]Js  Or in 4 packs at grocery stores and pharmacies  2.5 carbs  (Alternative is  a toasted Arnold's Sandwhich Thin w/ peanut butter, a "Bagel Thin" with cream cheese and salmon) or  a scrambled egg burrito made with a low carb tortilla .  Avoid cereal and bananas, oatmeal too unless you are cooking the old fashioned kind that takes 30-40 minutes to prepare.  the rest is overly processed, has minimal fiber, and is loaded with carbohydrates!   10 AM: Protein bar by Atkins (the snack size, under 200 cal).  There are many varieties , available widely again or in bulk in limited varieties at BJs)  Other so called "protein bars" tend to be loaded with carbohydrates.  Remember, in food advertising, the word "energy" is synonymous for " carbohydrate."  Lunch: sandwich of [place], (or any lunchmeat, grilled meat or canned tuna), fresh avocado, mayonnaise  and cheese on a lower carbohydrate pita bread, flatbread, or tortilla . Ok to use regular mayonnaise. The bread is the only source or carbohydrate that can be decreased (Joseph's makes a pita bread and a flat bread that are 50 cal and 4 net carbs ; Toufayan makes a low carb flatbread that's 100 cal and 9 net carbs   and  Mission makes a low carb whole wheat tortilla  That is 210 cal and 6 net carbs)  3 PM:  Mid day :  Another protein bar,  Or a  cheese stick (100 cal, 0 carbs),  Or 1 ounce of  almonds, walnuts, pistachios, pecans, peanuts,  Macadamia nuts. Or a Dannon light n Fit greek yogurt, 80 cal 8 net carbs . Avoid "granola"; the dried cranberries and raisins are loaded with carbohydrates. Mixed nuts ok if no raisins or cranberries or dried fruit.      6 PM  Dinner:  "mean and green:"  Meat/chicken/fish or a high protein legume; , with a green salad, and a low GI  Veggie (broccoli, cauliflower, green beans, spinach, brussel sprouts. Lima beans) : Avoid "Low fat dressings, as well as Reyne Dumas and [address]! They are loaded with sugar! Instead use ranch, vinagrette,  Blue cheese, etc.  There is a low carb pasta by Dreamfield's available at [employer] [facility] that is acceptable and tastes great. Try Michel Angel's chicken piccata over low carb pasta. The chicken dish is 0 carbs, and can be found in frozen section at BJs and [employer]'s. Also try [employer] "Carnitas" (pulled pork, no sauce,  0 carbs) and his pot roast.   both are in the refrigerated section at  BJs   9 PM snack : Breyer's "low carb" fudgsicle or  ice cream bar (Carb Smart line), or  Weight Watcher's ice cream bar , or another "no sugar added" ice cream;a serving of fresh berries/cherries with whipped cream (Avoid bananas, pineapple, grapes  and watermelon on a regular basis because they are high in sugar)   Remember that snack Substitutions should be less than 15 to 20 carbs  Per serving. Remember to subtract fiber grams and sugar alcohols to get the "net carbs."

## 2012-10-27 ENCOUNTER — Encounter: Payer: Self-pay | Admitting: Internal Medicine

## 2012-10-27 DIAGNOSIS — R140 Abdominal distension (gaseous): Secondary | ICD-10-CM | POA: Insufficient documentation

## 2012-10-27 DIAGNOSIS — F329 Major depressive disorder, single episode, unspecified: Secondary | ICD-10-CM | POA: Insufficient documentation

## 2012-10-27 DIAGNOSIS — F341 Dysthymic disorder: Secondary | ICD-10-CM | POA: Insufficient documentation

## 2012-10-27 DIAGNOSIS — F32A Depression, unspecified: Secondary | ICD-10-CM | POA: Insufficient documentation

## 2012-10-27 NOTE — Assessment & Plan Note (Signed)
Likely due to stricture in occurrence with solid foods. She has been instructed to resume daily PPI and we will arrange referral to Dr. Juanda Chance for EGD and interval colonoscopy.

## 2012-10-27 NOTE — Assessment & Plan Note (Signed)
Be simply due to IBS but given her very mild menopausal state The possibility of ovarian cancer needs to be ruled out.  She has been referred for abdomen/prlvic CT

## 2012-10-27 NOTE — Assessment & Plan Note (Signed)
Symptoms have improved on Celexa and with change in arm at work however she is requesting increase in medication due to continued insomnia. We will increase the Celexa to 20 mg daily. Samples of lasted given.

## 2012-11-02 ENCOUNTER — Other Ambulatory Visit: Payer: PRIVATE HEALTH INSURANCE

## 2012-11-03 ENCOUNTER — Ambulatory Visit (INDEPENDENT_AMBULATORY_CARE_PROVIDER_SITE_OTHER)
Admission: RE | Admit: 2012-11-03 | Discharge: 2012-11-03 | Disposition: A | Discharge status: Home / Self Care | Payer: PRIVATE HEALTH INSURANCE | Source: Ambulatory Visit | Attending: Internal Medicine | Admitting: Internal Medicine

## 2012-11-03 DIAGNOSIS — R141 Gas pain: Secondary | ICD-10-CM

## 2012-11-03 DIAGNOSIS — R140 Abdominal distension (gaseous): Secondary | ICD-10-CM

## 2012-11-03 MED ORDER — IOHEXOL 300 MG/ML  SOLN
100.0000 mL | Freq: Once | INTRAMUSCULAR | Status: AC | PRN
  Administered 2012-11-03: 100 mL via INTRAVENOUS

## 2012-12-06 ENCOUNTER — Emergency Department: Payer: Self-pay | Admitting: Emergency Medicine

## 2012-12-06 LAB — URINALYSIS, COMPLETE
Bilirubin,UR: NEGATIVE
Blood: NEGATIVE
Glucose,UR: NEGATIVE mg/dL (ref 0–75)
Ketone: NEGATIVE
Leukocyte Esterase: NEGATIVE
Nitrite: NEGATIVE
Ph: 7 (ref 4.5–8.0)
RBC,UR: NONE SEEN /HPF (ref 0–5)
Specific Gravity: 1.001 (ref 1.003–1.030)
WBC UR: <1 /HPF (ref 0–5)

## 2012-12-06 LAB — COMPREHENSIVE METABOLIC PANEL
Alkaline Phosphatase: 97 U/L (ref 50–136)
Anion Gap: 14 (ref 7–16)
BUN: 10 mg/dL (ref 7–18)
Calcium, Total: 9.3 mg/dL (ref 8.5–10.1)
Chloride: 105 mmol/L (ref 98–107)
Co2: 20 mmol/L — ABNORMAL LOW (ref 21–32)
Creatinine: 0.96 mg/dL (ref 0.60–1.30)
EGFR (African American): >60
Osmolality: 278 (ref 275–301)
Potassium: 3.7 mmol/L (ref 3.5–5.1)
Total Protein: 8.1 g/dL (ref 6.4–8.2)

## 2012-12-06 LAB — CBC
HCT: 40.1 % (ref 35.0–47.0)
MCH: 28.2 pg (ref 26.0–34.0)
MCHC: 32.6 g/dL (ref 32.0–36.0)
WBC: 9.9 10*3/uL (ref 3.6–11.0)

## 2012-12-06 LAB — TROPONIN I: Troponin-I: <0.02 ng/mL

## 2012-12-07 ENCOUNTER — Ambulatory Visit (INDEPENDENT_AMBULATORY_CARE_PROVIDER_SITE_OTHER): Payer: PRIVATE HEALTH INSURANCE | Admitting: Internal Medicine

## 2012-12-07 ENCOUNTER — Other Ambulatory Visit: Payer: Self-pay | Admitting: Internal Medicine

## 2012-12-07 ENCOUNTER — Encounter: Payer: Self-pay | Admitting: Internal Medicine

## 2012-12-07 VITALS — BP 110/80 | HR 82 | Temp 98.0°F | Resp 16 | Wt 170.0 lb

## 2012-12-07 DIAGNOSIS — R1110 Vomiting, unspecified: Secondary | ICD-10-CM

## 2012-12-07 DIAGNOSIS — Z8711 Personal history of peptic ulcer disease: Secondary | ICD-10-CM

## 2012-12-07 DIAGNOSIS — R197 Diarrhea, unspecified: Secondary | ICD-10-CM

## 2012-12-07 DIAGNOSIS — R110 Nausea: Secondary | ICD-10-CM

## 2012-12-07 DIAGNOSIS — Z8719 Personal history of other diseases of the digestive system: Secondary | ICD-10-CM

## 2012-12-07 DIAGNOSIS — R55 Syncope and collapse: Secondary | ICD-10-CM

## 2012-12-07 DIAGNOSIS — K589 Irritable bowel syndrome without diarrhea: Secondary | ICD-10-CM

## 2012-12-07 MED ORDER — DIPHENOXYLATE-ATROPINE 2.5-0.025 MG/5ML PO LIQD: 5.0000 mL | Freq: Four times a day (QID) | ORAL | Status: DC | PRN

## 2012-12-07 MED ORDER — ZOLPIDEM TARTRATE ER 12.5 MG PO TBCR: 12.5000 mg | EXTENDED_RELEASE_TABLET | Freq: Every evening | ORAL | Status: DC | PRN

## 2012-12-07 MED ORDER — PROMETHAZINE HCL 25 MG PO TABS: 25.0000 mg | ORAL_TABLET | Freq: Three times a day (TID) | ORAL | Status: DC | PRN

## 2012-12-07 NOTE — Telephone Encounter (Signed)
Patient is needing a prior authorization for her zolpidem (AMBIEN CR) 12.5 MG CR tablet.  [employer] # [id number]  Form in Dr. Ceasar Lund' Box

## 2012-12-07 NOTE — Patient Instructions (Addendum)
Resume omeprazole or prevacid once daily  30 minutes prior to any food or coffee.   Use lomotil as needed for diarrhea (try immodium)   Trial of ambien Cr

## 2012-12-07 NOTE — Assessment & Plan Note (Addendum)
Has stopped linzess and was having regular BMs .  Continue Lomotil and Imodium as needed.

## 2012-12-07 NOTE — Progress Notes (Signed)
Patient ID: Nichole Riddle, female   DOB: 11/02/1961, 52 y.o.   MRN: [id number]   Patient Active Problem List  Diagnosis  . Headache, migraine  . History of colonoscopy  . History of gastric ulcer  . History of cardiac catheterization  . Hot flashes  . Obesity  . Hyperlipidemia LDL goal < 130  . Irritable bowel  . Dysphagia  . Abdominal bloating  . Anxiety and depression  . Syncope, near    Subjective:  CC:   Chief Complaint  Patient presents with  . Blood Pressure    HPI:   Nichole S Smithis a 52 y.o. female who presents with a hstory of diarrhea for the past week without cramping or nausea but didn't think it was anything more than her usual  IBS. Woke up yesterday tired,  But went to work out with low weight reps/cardio using a Systems analyst.  After the workout she felt woozy and sluggish.  She was not tremulous but suddenly felt nauseated and vomited in the car. Felt presyncopal so [place]  [place] . Got out of car and had to lie down  But did not faint.  Systolic bp was 135/ taken to ER and was Ms given zofran and iv fluids  1 liter.  Ate some food,  bp was low 95/40 .  Blood work and UA was all normal. 2) Insomnia  Her insomnia is not responding to lunesta.  She is falling alseep but having frequent awakenings and wakes up tired.    Past Medical History  Diagnosis Date  . Gastric ulcer 2012    NSAID induced   . History of cardiac catheterization 2012    normal   . Gastric ulcer due to Helicobacter pylori 2012    and NSAID use  . Irritable bowel     with alternating constipation/bloating and diarrhea    History reviewed. No pertinent past surgical history.       The following portions of the patient's history were reviewed and updated as appropriate: Allergies, current medications, and problem list.    Review of Systems:   Patient denies headache, fevers, malaise, unintentional weight loss, skin rash, eye pain, sinus congestion and sinus pain, sore  throat, dysphagia,  hemoptysis , cough, dyspnea, wheezing, chest pain, palpitations, orthopnea, edema, abdominal pain, nausea, melena, constipation, flank pain, dysuria, hematuria, urinary  Frequency, nocturia, numbness, tingling, seizures,  Focal weakness,  and suicidal ideation.       History   Social History  . Marital Status: Single    Spouse Name: N/A    Number of Children: N/A  . Years of Education: N/A   Occupational History  . nurses aide     Hospice   Social History Main Topics  . Smoking status: Never Smoker   . Smokeless tobacco: Never Used  . Alcohol Use: Yes  . Drug Use: No  . Sexually Active: Not on file   Other Topics Concern  . Not on file   Social History Narrative  . No narrative on file    Objective:  BP 110/80  Pulse 82  Temp 98 F (36.7 C) (Oral)  Resp 16  Wt 170 lb (77.111 kg)  SpO2 98%  General appearance: alert, cooperative and appears stated age Ears: normal TM's and external ear canals both ears Throat: lips, mucosa, and tongue normal; teeth and gums normal Neck: no adenopathy, no carotid bruit, supple, symmetrical, trachea midline and thyroid not enlarged, symmetric, no tenderness/mass/nodules Back:  symmetric, no curvature. ROM normal. No CVA tenderness. Lungs: clear to auscultation bilaterally Heart: regular rate and rhythm, S1, S2 normal, no murmur, click, rub or gallop Abdomen: soft, non-tender; bowel sounds normal; no masses,  no organomegaly Pulses: 2+ and symmetric Skin: Skin color, texture, turgor normal. No rashes or lesions Lymph nodes: Cervical, supraclavicular, and axillary nodes normal.  Assessment and Plan:  Irritable bowel Has stopped linzess and was having regular BMs .  Continue Lomotil and Imodium as needed.  Syncope, near Secondary to dehydration and orthostasis. Influenza test was done today. ER records were reviewed. She is no signs of systemic illness currently. Recommended increasing fluid intake before and  after workouts   History of gastric ulcer Advised to resume daily PPI due to recurrent symptoms of abdominal pain.   Updated Medication List Outpatient Encounter Prescriptions as of 12/07/2012  Medication Sig Dispense Refill  . ALPRAZolam (XANAX) 0.5 MG tablet Take 1 tablet (0.5 mg total) by mouth at bedtime as needed for sleep.  30 tablet  3  . b complex vitamins tablet Take 1 tablet by mouth daily.      . citalopram (CELEXA) 20 MG tablet Take 1 tablet (20 mg total) by mouth daily.  90 tablet  1  . clonazePAM (KLONOPIN) 1 MG tablet Take 1 tablet (1 mg total) by mouth 2 (two) times daily as needed for anxiety.  60 tablet  2  . diphenoxylate-atropine (LOMOTIL) 2.5-0.025 MG/5ML liquid Take 5 mLs by mouth 4 (four) times daily as needed.  120 mL  0  . Linaclotide (LINZESS) 145 MCG CAPS Take 1 capsule (145 mcg total) by mouth daily.  16 capsule  0  . promethazine (PHENERGAN) 25 MG tablet Take 1 tablet (25 mg total) by mouth every 8 (eight) hours as needed for nausea.  30 tablet  1  . zolpidem (AMBIEN CR) 12.5 MG CR tablet Take 1 tablet (12.5 mg total) by mouth at bedtime as needed for sleep.  30 tablet  0  . [DISCONTINUED] omeprazole (PRILOSEC) 40 MG capsule Take 1 capsule (40 mg total) by mouth 2 (two) times daily before a meal.  60 capsule  2  . [DISCONTINUED] promethazine (PHENERGAN) 25 MG tablet Take 1 tablet (25 mg total) by mouth every 8 (eight) hours as needed for nausea.  30 tablet  1     Orders Placed This Encounter  Procedures  . POCT Influenza A/B    No Follow-up on file.

## 2012-12-08 ENCOUNTER — Other Ambulatory Visit: Payer: Self-pay | Admitting: Internal Medicine

## 2012-12-08 NOTE — Telephone Encounter (Signed)
Pharmacy needing prior authorization for  zolpidem (AMBIEN CR) 12.5 MG CR tablet   [phone]

## 2012-12-08 NOTE — Telephone Encounter (Signed)
Insurance company called waiting on Fax.

## 2012-12-09 ENCOUNTER — Encounter: Payer: Self-pay | Admitting: Internal Medicine

## 2012-12-09 DIAGNOSIS — R55 Syncope and collapse: Secondary | ICD-10-CM | POA: Insufficient documentation

## 2012-12-09 NOTE — Assessment & Plan Note (Signed)
Secondary dehydration and orthostasis. Recommended increasing fluid intake before and after workouts

## 2012-12-09 NOTE — Assessment & Plan Note (Signed)
Advised to resume daily PPI due to recurrent symptoms of abdominal pain.

## 2012-12-11 ENCOUNTER — Encounter: Payer: Self-pay | Admitting: Internal Medicine

## 2012-12-11 NOTE — Telephone Encounter (Signed)
Pt received generic PA no longer needed.

## 2012-12-11 NOTE — Telephone Encounter (Signed)
PT had generic called in no longer needs PA.

## 2012-12-18 ENCOUNTER — Telehealth: Payer: Self-pay | Admitting: *Deleted

## 2012-12-18 NOTE — Telephone Encounter (Signed)
Called [phone] for prior authorization

## 2012-12-27 ENCOUNTER — Telehealth: Payer: Self-pay | Admitting: *Deleted

## 2012-12-27 NOTE — Telephone Encounter (Signed)
Called [phone] for prior authorization got transferred to [phone] the Zolpidem 12.5 mg was approved, they are faxing over the approval form now

## 2013-01-01 ENCOUNTER — Telehealth: Payer: Self-pay | Admitting: General Practice

## 2013-01-01 NOTE — Telephone Encounter (Signed)
She will need to make an appt .  We have not discussed her anxiety/depression in quite a while.

## 2013-01-01 NOTE — Telephone Encounter (Signed)
Pt states that anxiety and irritability levels have increased,she feels jittery, having nightmares, cannot stop crying. Pt states that she feels like she did before she started the medication.

## 2013-01-01 NOTE — Telephone Encounter (Signed)
Pt states that she thinks thatt 20mg  of celexa is not working for her. Please advise.

## 2013-01-01 NOTE — Telephone Encounter (Signed)
I cannt make changes to medications with a message as vague as that. Please ask her to describe her symptoms or make an appt

## 2013-01-02 NOTE — Telephone Encounter (Signed)
Pt notified and appt made on 01/08/13 by front desk.

## 2013-01-08 ENCOUNTER — Ambulatory Visit: Payer: PRIVATE HEALTH INSURANCE | Admitting: Internal Medicine

## 2013-01-29 ENCOUNTER — Ambulatory Visit: Payer: PRIVATE HEALTH INSURANCE | Admitting: Internal Medicine

## 2013-02-06 ENCOUNTER — Ambulatory Visit: Payer: PRIVATE HEALTH INSURANCE | Admitting: Internal Medicine

## 2013-02-14 ENCOUNTER — Ambulatory Visit: Payer: PRIVATE HEALTH INSURANCE | Admitting: Internal Medicine

## 2013-03-09 ENCOUNTER — Ambulatory Visit (INDEPENDENT_AMBULATORY_CARE_PROVIDER_SITE_OTHER): Payer: PRIVATE HEALTH INSURANCE | Admitting: Internal Medicine

## 2013-03-09 ENCOUNTER — Encounter: Payer: Self-pay | Admitting: Internal Medicine

## 2013-03-09 VITALS — BP 118/78 | HR 83 | Temp 98.3°F | Resp 16 | Wt 173.0 lb

## 2013-03-09 DIAGNOSIS — F341 Dysthymic disorder: Secondary | ICD-10-CM

## 2013-03-09 DIAGNOSIS — F32A Depression, unspecified: Secondary | ICD-10-CM

## 2013-03-09 DIAGNOSIS — F419 Anxiety disorder, unspecified: Secondary | ICD-10-CM

## 2013-03-09 DIAGNOSIS — Z780 Asymptomatic menopausal state: Secondary | ICD-10-CM

## 2013-03-09 DIAGNOSIS — M2550 Pain in unspecified joint: Secondary | ICD-10-CM

## 2013-03-09 DIAGNOSIS — R609 Edema, unspecified: Secondary | ICD-10-CM

## 2013-03-09 DIAGNOSIS — G4700 Insomnia, unspecified: Secondary | ICD-10-CM

## 2013-03-09 DIAGNOSIS — F329 Major depressive disorder, single episode, unspecified: Secondary | ICD-10-CM

## 2013-03-09 DIAGNOSIS — Z13220 Encounter for screening for lipoid disorders: Secondary | ICD-10-CM

## 2013-03-09 DIAGNOSIS — M130 Polyarthritis, unspecified: Secondary | ICD-10-CM

## 2013-03-09 LAB — POCT URINALYSIS DIPSTICK
Bilirubin, UA: NEGATIVE
Glucose, UA: NEGATIVE
Nitrite, UA: NEGATIVE
Spec Grav, UA: 1.015
Urobilinogen, UA: 0.2

## 2013-03-09 LAB — CBC WITH DIFFERENTIAL/PLATELET
Basophils Relative: 1 % (ref 0–1)
Eosinophils Absolute: 0.1 10*3/uL (ref 0.0–0.7)
Eosinophils Relative: 1 % (ref 0–5)
Hemoglobin: 12.8 g/dL (ref 12.0–15.0)
Lymphs Abs: 1.8 10*3/uL (ref 0.7–4.0)
MCH: 28.1 pg (ref 26.0–34.0)
MCHC: 34 g/dL (ref 30.0–36.0)
MCV: 82.6 fL (ref 78.0–100.0)
Monocytes Relative: 6 % (ref 3–12)
Neutrophils Relative %: 62 % (ref 43–77)
RBC: 4.55 MIL/uL (ref 3.87–5.11)

## 2013-03-09 LAB — SEDIMENTATION RATE: Sed Rate: 6 mm/hr (ref 0–22)

## 2013-03-09 LAB — COMPREHENSIVE METABOLIC PANEL
Alkaline Phosphatase: 80 U/L (ref 39–117)
BUN: 9 mg/dL (ref 6–23)
CO2: 27 mEq/L (ref 19–32)
Creat: 0.87 mg/dL (ref 0.50–1.10)
Glucose, Bld: 89 mg/dL (ref 70–99)
Total Bilirubin: 0.3 mg/dL (ref 0.3–1.2)

## 2013-03-09 MED ORDER — NABUMETONE 750 MG PO TABS: 750.0000 mg | ORAL_TABLET | Freq: Two times a day (BID) | ORAL | Status: DC | PRN

## 2013-03-09 MED ORDER — SPIRONOLACTONE 50 MG PO TABS: 50.0000 mg | ORAL_TABLET | Freq: Every day | ORAL | Status: DC

## 2013-03-09 MED ORDER — ZOLPIDEM TARTRATE ER 12.5 MG PO TBCR: 12.5000 mg | EXTENDED_RELEASE_TABLET | Freq: Every evening | ORAL | Status: DC | PRN

## 2013-03-09 MED ORDER — ALPRAZOLAM 0.5 MG PO TABS: 0.5000 mg | ORAL_TABLET | Freq: Every evening | ORAL | Status: DC | PRN

## 2013-03-09 NOTE — Patient Instructions (Addendum)
Ask trainer about glucosamine supplements for your joints  NSAID plus tylenol for joint pain (nabumetone)  Spironolactone (diuretic) to take daily for water retention.    Labs to check for inflammation   Cut back on water ,  Use gatorade or G2   Consider resuming your citalopram given your current stress level!

## 2013-03-09 NOTE — Progress Notes (Signed)
Patient ID: Nichole Riddle, female   DOB: [birth year], 52 y.o.   MRN: [id number]   Patient Active Problem List  Diagnosis  . Headache, migraine  . History of colonoscopy  . History of gastric ulcer  . History of cardiac catheterization  . Menopause  . Obesity  . Hyperlipidemia LDL goal < 130  . Irritable bowel  . Anxiety and depression  . Syncope, near  . Polyarthralgia    Subjective:  CC:   Chief Complaint  Patient presents with  . Joint Swelling    fatigue X 3 weeks    HPI:   Nichole S Smithis a 52 y.o. female who presents for evaluation of stiff joints.  Patient was last seen in January after a presyncopal event that was attributed to dehydration in the setting of recent diarrheal illness. For the subsequent 2 months she states that she was feeling much better,  No syncopal events.  She had started working out again, eating well and maintaining good sleep habits, and maintaining proper hydration.  Then about 4 weeks ago she developed stiffening of the joints in both hands, following by ankles and knees feeling stiff and swollen.  Even the bottoms of her feet started hurting, Left side of body hurting more than the right ,  And she has gained 3 lbs despite her clothes feeling more loose. She has stopped going to the gym as of 2 weeks ago, when her right knee started swelling.   She continues to have monthly cycling of menopausal symptoms including hot flushes, and mood swings.  No fevers.  After a lengthy discussion of symptoms, she admits to increased stressors at home due to the discovery that her partner of 6 years has a serious cocaine addiction and is in the process of separating from him.  They are cohabiting so she has asked him to move out.  there is no domestic violence or emotional abuse.    Past Medical History  Diagnosis Date  . Gastric ulcer 2012    NSAID induced   . History of cardiac catheterization 2012    normal   . Gastric ulcer due to Helicobacter pylori  2012    and NSAID use  . Irritable bowel     with alternating constipation/bloating and diarrhea    History reviewed. No pertinent past surgical history.     The following portions of the patient's history were reviewed and updated as appropriate: Allergies, current medications, and problem list.    Review of Systems:  Patient denies headache, fevers, malaise, unintentional weight loss, skin rash, eye pain, sinus congestion and sinus pain, sore throat, dysphagia,  hemoptysis , cough, dyspnea, wheezing, chest pain, palpitations, orthopnea, edema, abdominal pain, nausea, melena, diarrhea, constipation, flank pain, dysuria, hematuria, urinary  Frequency, nocturia, numbness, tingling, seizures,  Focal weakness, Loss of consciousness,  Tremor,  depression,and suicidal ideation.         History   Social History  . Marital Status: Single    Spouse Name: N/A    Number of Children: N/A  . Years of Education: N/A   Occupational History  . nurses aide     Hospice   Social History Main Topics  . Smoking status: Never Smoker   . Smokeless tobacco: Never Used  . Alcohol Use: Yes  . Drug Use: No  . Sexually Active: Not on file   Other Topics Concern  . Not on file   Social History Narrative  . No narrative on file  Objective:  BP 118/78  Pulse 83  Temp(Src) 98.3 F (36.8 C) (Oral)  Resp 16  Wt 173 lb (78.472 kg)  BMI 29.25 kg/m2  SpO2 98%  General appearance: alert, cooperative and appears stated age Ears: normal TM's and external ear canals both ears Throat: lips, mucosa, and tongue normal; teeth and gums normal Neck: no adenopathy, no carotid bruit, supple, symmetrical, trachea midline and thyroid not enlarged, symmetric, no tenderness/mass/nodules Back: symmetric, no curvature. ROM normal. No CVA tenderness. Lungs: clear to auscultation bilaterally Heart: regular rate and rhythm, S1, S2 normal, no murmur, click, rub or gallop Abdomen: soft, non-tender; bowel  sounds normal; no masses,  no organomegaly Pulses: 2+ and symmetric Skin: Skin color, texture, turgor normal. No rashes or lesions Lymph nodes: Cervical, supraclavicular, and axillary nodes normal. MSK: hands, wrists, elbows knees and ankles examined. No warmth, redness or synovitis. Psych:  Tearful, appropriate demeanor, maintains good eye contact.   Assessment and Plan:  Menopause Symptoms are not severe, HRT not necessary, but resuming SSRI was suggested.   Anxiety and depression She discontinued celexa several months ago due to weight gain .  Given recent stressful events and current anxiety state,  I recommended resuming it for a trial of 3 months.  Prn alprazolam given.   Polyarthralgia There is no synovitis of any joints that are currently cited as painful by patient.  Serologies for inflammatory and autoimmune forms of arthritis are either pending or negative.  Trial of NSAID and glucosamine recommended.     Updated Medication List Outpatient Encounter Prescriptions as of 03/09/2013  Medication Sig Dispense Refill  . b complex vitamins tablet Take 1 tablet by mouth daily.      . diphenoxylate-atropine (LOMOTIL) 2.5-0.025 MG/5ML liquid Take 5 mLs by mouth 4 (four) times daily as needed.  120 mL  0  . promethazine (PHENERGAN) 25 MG tablet Take 1 tablet (25 mg total) by mouth every 8 (eight) hours as needed for nausea.  30 tablet  1  . zolpidem (AMBIEN CR) 12.5 MG CR tablet Take 1 tablet (12.5 mg total) by mouth at bedtime as needed for sleep.  30 tablet  0  . [DISCONTINUED] zolpidem (AMBIEN CR) 12.5 MG CR tablet Take 1 tablet (12.5 mg total) by mouth at bedtime as needed for sleep.  30 tablet  0  . ALPRAZolam (XANAX) 0.5 MG tablet Take 1 tablet (0.5 mg total) by mouth at bedtime as needed for sleep.  30 tablet  3  . citalopram (CELEXA) 20 MG tablet Take 1 tablet (20 mg total) by mouth daily.  90 tablet  1  . clonazePAM (KLONOPIN) 1 MG tablet Take 1 tablet (1 mg total) by mouth 2  (two) times daily as needed for anxiety.  60 tablet  2  . nabumetone (RELAFEN) 750 MG tablet Take 1 tablet (750 mg total) by mouth 2 (two) times daily as needed for pain.  60 tablet  3  . spironolactone (ALDACTONE) 50 MG tablet Take 1 tablet (50 mg total) by mouth daily.  30 tablet  1  . [DISCONTINUED] ALPRAZolam (XANAX) 0.5 MG tablet Take 1 tablet (0.5 mg total) by mouth at bedtime as needed for sleep.  30 tablet  3  . [DISCONTINUED] Linaclotide (LINZESS) 145 MCG CAPS Take 1 capsule (145 mcg total) by mouth daily.  16 capsule  0   No facility-administered encounter medications on file as of 03/09/2013.     Orders Placed This Encounter  Procedures  . ANA  . Rheumatoid  factor  . Urine Microalbumin w/creat. ratio  . Sed Rate (ESR)  . C-reactive protein  . Comprehensive metabolic panel  . CBC with Differential  . POCT urinalysis dipstick    Return in about 1 week (around 03/16/2013).

## 2013-03-10 LAB — MICROALBUMIN / CREATININE URINE RATIO
Microalb Creat Ratio: 5.4 mg/g (ref 0.0–30.0)
Microalb, Ur: 0.5 mg/dL (ref 0.00–1.89)

## 2013-03-10 LAB — RHEUMATOID FACTOR: Rhuematoid fact SerPl-aCnc: <10 IU/mL (ref <=14)

## 2013-03-11 ENCOUNTER — Encounter: Payer: Self-pay | Admitting: Internal Medicine

## 2013-03-11 DIAGNOSIS — M2550 Pain in unspecified joint: Secondary | ICD-10-CM | POA: Insufficient documentation

## 2013-03-11 NOTE — Assessment & Plan Note (Signed)
Symptoms are not severe, HRT not necessary, but resuming SSRI was suggested.

## 2013-03-11 NOTE — Assessment & Plan Note (Signed)
She discontinued celexa several months ago due to weight gain .  Given recent stressful events and current anxiety state,  I recommended resuming it for a trial of 3 months.  Prn alprazolam given.

## 2013-03-11 NOTE — Assessment & Plan Note (Signed)
There is no synovitis of any joints that are currently cited as painful by patient.  Serologies for inflammatory and autoimmune forms of arthritis are either pending or negative.  Trial of NSAID and glucosamine recommended.  

## 2013-03-12 LAB — ANA: Anti Nuclear Antibody(ANA): NEGATIVE

## 2013-03-20 ENCOUNTER — Telehealth: Payer: Self-pay | Admitting: Internal Medicine

## 2013-03-20 DIAGNOSIS — M2550 Pain in unspecified joint: Secondary | ICD-10-CM

## 2013-03-20 NOTE — Telephone Encounter (Signed)
Referral is in process as requested 

## 2013-03-20 NOTE — Telephone Encounter (Signed)
Patient called back and stated that her joints are still swollen she said that at her last visit you offered to send her to a rheumatologist she would like for this referral. She said that Friday's are best to make her appointment on.

## 2013-03-20 NOTE — Telephone Encounter (Signed)
Patient states joints still swollen and would like referral Rheumatology.

## 2013-03-20 NOTE — Telephone Encounter (Signed)
Left message, advising pt of referral in process.

## 2013-03-26 ENCOUNTER — Telehealth: Payer: Self-pay | Admitting: Internal Medicine

## 2013-03-26 MED ORDER — CITALOPRAM HYDROBROMIDE 20 MG PO TABS: 20.0000 mg | ORAL_TABLET | Freq: Every day | ORAL | Status: DC

## 2013-03-26 NOTE — Telephone Encounter (Signed)
Done, celexa rx 20 mg sent to pharmacy .  Have her start with 1/2 tablet daily for a few days before going to full tablet.

## 2013-03-26 NOTE — Telephone Encounter (Signed)
Pt states she was seen a few weeks ago and discussed being put back on Celexa.  Pt states at that time she did not want to, but has now changed her mind and would like the script for Celexa if possible.  Please advise if this can be called into [facility].

## 2013-03-27 NOTE — Telephone Encounter (Signed)
Left message for patient to call.

## 2013-03-27 NOTE — Telephone Encounter (Signed)
Left message for patient to call office.  

## 2013-03-28 NOTE — Telephone Encounter (Signed)
Left message on voicemail for patient to call office. 

## 2013-03-28 NOTE — Telephone Encounter (Signed)
Patient notified

## 2013-05-15 ENCOUNTER — Telehealth: Payer: Self-pay | Admitting: Internal Medicine

## 2013-05-15 DIAGNOSIS — M25562 Pain in left knee: Secondary | ICD-10-CM

## 2013-05-15 DIAGNOSIS — M25561 Pain in right knee: Secondary | ICD-10-CM

## 2013-05-15 NOTE — Telephone Encounter (Signed)
There is no [facility] medicine doctor except Dr Zachery Dauer at [facility]  Referral to dr . Dallas Schimke,  [place] sports medicine doctor  at [facility]. In process

## 2013-05-15 NOTE — Telephone Encounter (Signed)
Patient called stating she cancelled her apt with [facility], in regards to her knee's. She is stating that she would actually not like to see [facility]. She wants to see a sports med doctor who is aware of her issue. Please advise.

## 2013-05-16 NOTE — Telephone Encounter (Signed)
Patient notified referral in process. 

## 2013-05-24 ENCOUNTER — Ambulatory Visit: Payer: PRIVATE HEALTH INSURANCE | Admitting: Family Medicine

## 2013-05-25 ENCOUNTER — Ambulatory Visit: Payer: PRIVATE HEALTH INSURANCE | Admitting: Adult Health

## 2013-05-30 ENCOUNTER — Ambulatory Visit: Payer: PRIVATE HEALTH INSURANCE | Admitting: Internal Medicine

## 2013-06-05 ENCOUNTER — Encounter: Payer: PRIVATE HEALTH INSURANCE | Admitting: Family Medicine

## 2013-06-05 DIAGNOSIS — Z0289 Encounter for other administrative examinations: Secondary | ICD-10-CM

## 2013-06-05 NOTE — Progress Notes (Signed)
This encounter was created in error - please disregard.

## 2013-06-15 ENCOUNTER — Encounter: Payer: PRIVATE HEALTH INSURANCE | Admitting: Internal Medicine

## 2013-07-17 ENCOUNTER — Encounter: Payer: Self-pay | Admitting: *Deleted

## 2013-07-18 ENCOUNTER — Encounter: Payer: PRIVATE HEALTH INSURANCE | Admitting: Internal Medicine

## 2013-07-23 ENCOUNTER — Telehealth: Payer: Self-pay | Admitting: Internal Medicine

## 2013-07-23 DIAGNOSIS — G4700 Insomnia, unspecified: Secondary | ICD-10-CM

## 2013-07-23 NOTE — Telephone Encounter (Signed)
ALPRAZolam (XANAX) 0.5 MG tablet  °

## 2013-07-24 MED ORDER — ALPRAZOLAM 0.5 MG PO TABS: 0.5000 mg | ORAL_TABLET | Freq: Every evening | ORAL | Status: DC | PRN

## 2013-07-24 NOTE — Telephone Encounter (Signed)
Last OV 03/09/13 please advise ok to refill.

## 2013-07-24 NOTE — Telephone Encounter (Signed)
Message left on voice mail as requested

## 2013-07-24 NOTE — Telephone Encounter (Signed)
Ok to refill alprazolam ,  Authorized in epic

## 2013-07-24 NOTE — Telephone Encounter (Signed)
Please call patient when this is called in she has called today to check the status.

## 2013-08-02 ENCOUNTER — Encounter: Payer: Self-pay | Admitting: *Deleted

## 2013-08-03 ENCOUNTER — Other Ambulatory Visit (HOSPITAL_COMMUNITY)
Admission: RE | Admit: 2013-08-03 | Discharge: 2013-08-03 | Disposition: A | Discharge status: Home / Self Care | Payer: PRIVATE HEALTH INSURANCE | Source: Ambulatory Visit | Attending: Internal Medicine | Admitting: Internal Medicine

## 2013-08-03 ENCOUNTER — Encounter: Payer: Self-pay | Admitting: Internal Medicine

## 2013-08-03 ENCOUNTER — Ambulatory Visit (INDEPENDENT_AMBULATORY_CARE_PROVIDER_SITE_OTHER): Payer: PRIVATE HEALTH INSURANCE | Admitting: Internal Medicine

## 2013-08-03 VITALS — BP 122/76 | HR 81 | Temp 98.2°F | Resp 14 | Ht 64.0 in | Wt 176.5 lb

## 2013-08-03 DIAGNOSIS — F410 Panic disorder [episodic paroxysmal anxiety] without agoraphobia: Secondary | ICD-10-CM

## 2013-08-03 DIAGNOSIS — F32A Depression, unspecified: Secondary | ICD-10-CM

## 2013-08-03 DIAGNOSIS — M2550 Pain in unspecified joint: Secondary | ICD-10-CM

## 2013-08-03 DIAGNOSIS — F329 Major depressive disorder, single episode, unspecified: Secondary | ICD-10-CM

## 2013-08-03 DIAGNOSIS — Z1151 Encounter for screening for human papillomavirus (HPV): Secondary | ICD-10-CM | POA: Insufficient documentation

## 2013-08-03 DIAGNOSIS — F341 Dysthymic disorder: Secondary | ICD-10-CM

## 2013-08-03 DIAGNOSIS — M25579 Pain in unspecified ankle and joints of unspecified foot: Secondary | ICD-10-CM

## 2013-08-03 DIAGNOSIS — Z1239 Encounter for other screening for malignant neoplasm of breast: Secondary | ICD-10-CM

## 2013-08-03 DIAGNOSIS — Z124 Encounter for screening for malignant neoplasm of cervix: Secondary | ICD-10-CM

## 2013-08-03 DIAGNOSIS — Z01419 Encounter for gynecological examination (general) (routine) without abnormal findings: Secondary | ICD-10-CM | POA: Insufficient documentation

## 2013-08-03 DIAGNOSIS — E669 Obesity, unspecified: Secondary | ICD-10-CM

## 2013-08-03 DIAGNOSIS — Z23 Encounter for immunization: Secondary | ICD-10-CM

## 2013-08-03 DIAGNOSIS — G43909 Migraine, unspecified, not intractable, without status migrainosus: Secondary | ICD-10-CM

## 2013-08-03 DIAGNOSIS — Z0000 Encounter for general adult medical examination without abnormal findings: Secondary | ICD-10-CM

## 2013-08-03 MED ORDER — AZELASTINE-FLUTICASONE 137-50 MCG/ACT NA SUSP: 2.0000 | Freq: Two times a day (BID) | NASAL | Status: DC

## 2013-08-03 MED ORDER — CLONAZEPAM 1 MG PO TABS: 1.0000 mg | ORAL_TABLET | Freq: Two times a day (BID) | ORAL | Status: DC | PRN

## 2013-08-03 MED ORDER — DEXLANSOPRAZOLE 60 MG PO CPDR: 60.0000 mg | DELAYED_RELEASE_CAPSULE | Freq: Every day | ORAL | Status: DC

## 2013-08-03 MED ORDER — RIZATRIPTAN BENZOATE 10 MG PO TBDP: 10.0000 mg | ORAL_TABLET | ORAL | Status: DC | PRN

## 2013-08-03 NOTE — Progress Notes (Signed)
Patient ID: Nichole Riddle, female   DOB: Jun 09, 1961, 52 y.o.   MRN: [id number]  Subjective:     Nichole Riddle is a 52 y.o. female and is here for a comprehensive physical exam. The patient reports problems - with chronic weekday insomnia.  History   Social History  . Marital Status: Single    Spouse Name: N/A    Number of Children: N/A  . Years of Education: N/A   Occupational History  . nurses aide     Hospice   Social History Main Topics  . Smoking status: Never Smoker   . Smokeless tobacco: Never Used  . Alcohol Use: Yes  . Drug Use: No  . Sexual Activity: Not on file   Other Topics Concern  . Not on file   Social History Narrative  . No narrative on file   Health Maintenance  Topic Date Due  . Influenza Vaccine  06/29/2013  . Mammogram  07/20/2013  . Pap Smear  08/03/2016  . Colonoscopy  07/21/2019  . Tetanus/tdap  08/04/2023    The following portions of the patient's history were reviewed and updated as appropriate: allergies, current medications, past family history, past medical history, past social history, past surgical history and problem list.  Review of Systems A comprehensive review of systems was negative.   Objective:   BP 122/76  Pulse 81  Temp(Src) 98.2 F (36.8 C) (Oral)  Resp 14  Ht 5\' 4"  (1.626 m)  Wt 176 lb 8 oz (80.06 kg)  BMI 30.28 kg/m2  SpO2 98%   General Appearance:    Alert, cooperative, no distress, appears stated age  Head:    Normocephalic, without obvious abnormality, atraumatic  Eyes:    PERRL, conjunctiva/corneas clear, EOM's intact, fundi    benign, both eyes  Ears:    Normal TM's and external ear canals, both ears  Nose:   Nares normal, septum midline, mucosa normal, no drainage    or sinus tenderness  Throat:   Lips, mucosa, and tongue normal; teeth and gums normal  Neck:   Supple, symmetrical, trachea midline, no adenopathy;    thyroid:  no enlargement/tenderness/nodules; no carotid   bruit or JVD  Back:      Symmetric, no curvature, ROM normal, no CVA tenderness  Lungs:     Clear to auscultation bilaterally, respirations unlabored  Chest Wall:    No tenderness or deformity   Heart:    Regular rate and rhythm, S1 and S2 normal, no murmur, rub   or gallop  Breast Exam:    No tenderness, masses, or nipple abnormality  Abdomen:     Soft, non-tender, bowel sounds active all four quadrants,    no masses, no organomegaly  Genitalia:    Pelvic: cervix normal in appearance, external genitalia normal, no adnexal masses or tenderness, no cervical motion tenderness, rectovaginal septum normal, uterus normal size, shape, and consistency and vagina normal without discharge  Extremities:   Extremities normal, atraumatic, no cyanosis or edema  Pulses:   2+ and symmetric all extremities  Skin:   Skin color, texture, turgor normal, no rashes or lesions  Lymph nodes:   Cervical, supraclavicular, and axillary nodes normal  Neurologic:   CNII-XII intact, normal strength, sensation and reflexes    throughout    Assessment:   Headache, migraine Previously managed by Dr. Sandria Manly who is now retired. Refill for Maxalt given.  Polyarthralgia Serologic workup for rheumatoid and other types of inflammatory arthritis was negative. Patient has  been counseled on need for regular exercise and use of nonsteroidal anti-inflammatories when needed.  Anxiety and depression Continue citalopram and use clonazepam as needed for insomnia.  Encounter for preventive health examination Annual comprehensive exam was done including breast, pelvic and PAP smear. All screenings have been addressed .   Obesity I have addressed  BMI and recommended wt loss of 10% of body weigh over the next 6 months using a low glycemic index diet and regular exercise a minimum of 5 days per week.     Updated Medication List Outpatient Encounter Prescriptions as of 08/03/2013  Medication Sig Dispense Refill  . ALPRAZolam (XANAX) 0.5 MG tablet Take 1  tablet (0.5 mg total) by mouth at bedtime as needed for sleep.  30 tablet  3  . b complex vitamins tablet Take 1 tablet by mouth daily.      . citalopram (CELEXA) 20 MG tablet Take 1 tablet (20 mg total) by mouth daily.  90 tablet  1  . glucosamine-chondroitin 500-400 MG tablet Take 1 tablet by mouth 2 (two) times daily.      . promethazine (PHENERGAN) 25 MG tablet Take 1 tablet (25 mg total) by mouth every 8 (eight) hours as needed for nausea.  30 tablet  1  . Azelastine-Fluticasone (DYMISTA) 137-50 MCG/ACT SUSP Place 2 Squirts into the nose 2 (two) times daily. In each nostril  18 g  0  . clonazePAM (KLONOPIN) 1 MG tablet Take 1 tablet (1 mg total) by mouth 2 (two) times daily as needed for anxiety.  60 tablet  2  . dexlansoprazole (DEXILANT) 60 MG capsule Take 1 capsule (60 mg total) by mouth daily.  15 capsule  0  . diphenoxylate-atropine (LOMOTIL) 2.5-0.025 MG/5ML liquid Take 5 mLs by mouth 4 (four) times daily as needed.  120 mL  0  . nabumetone (RELAFEN) 750 MG tablet Take 1 tablet (750 mg total) by mouth 2 (two) times daily as needed for pain.  60 tablet  3  . rizatriptan (MAXALT-MLT) 10 MG disintegrating tablet Take 1 tablet (10 mg total) by mouth as needed for migraine. May repeat in 2 hours if needed  10 tablet  0  . spironolactone (ALDACTONE) 50 MG tablet Take 1 tablet (50 mg total) by mouth daily.  30 tablet  1  . [DISCONTINUED] clonazePAM (KLONOPIN) 1 MG tablet Take 1 tablet (1 mg total) by mouth 2 (two) times daily as needed for anxiety.  60 tablet  2  . [DISCONTINUED] zolpidem (AMBIEN CR) 12.5 MG CR tablet Take 1 tablet (12.5 mg total) by mouth at bedtime as needed for sleep.  30 tablet  0   No facility-administered encounter medications on file as of 08/03/2013.

## 2013-08-03 NOTE — Patient Instructions (Addendum)
Try using the klonopin 30 minutes prior to bedtime or the alprazolam 15 minutes before bedtime   We are treating your cough for 3 weeks with Dexilant (for reflux esophagitis, one tablet daily) AND Dymista (nasal spray for allergies,  1 squirt in each nostril two times daily = 4 squirts total per day )  E mail me before you run out of symptoms to let me know if it is helping

## 2013-08-04 LAB — WET PREP BY MOLECULAR PROBE
Candida species: NEGATIVE
Gardnerella vaginalis: NEGATIVE
Trichomonas vaginosis: NEGATIVE

## 2013-08-05 DIAGNOSIS — Z0000 Encounter for general adult medical examination without abnormal findings: Secondary | ICD-10-CM | POA: Insufficient documentation

## 2013-08-05 NOTE — Assessment & Plan Note (Signed)
Serologic workup for rheumatoid and other types of inflammatory arthritis was negative. Patient has been counseled on need for regular exercise and use of nonsteroidal anti-inflammatories when needed.

## 2013-08-05 NOTE — Assessment & Plan Note (Signed)
Continue citalopram and use clonazepam as needed for insomnia.

## 2013-08-05 NOTE — Assessment & Plan Note (Signed)
I have addressed  BMI and recommended wt loss of 10% of body weigh over the next 6 months using a low glycemic index diet and regular exercise a minimum of 5 days per week.   

## 2013-08-05 NOTE — Assessment & Plan Note (Signed)
Previously managed by Dr. Sandria Manly who is now retired. Refill for Maxalt given.

## 2013-08-05 NOTE — Assessment & Plan Note (Signed)
Annual comprehensive exam was done including breast, pelvic and PAP smear. All screenings have been addressed .  

## 2013-08-08 ENCOUNTER — Encounter: Payer: Self-pay | Admitting: Internal Medicine

## 2013-08-17 ENCOUNTER — Telehealth: Payer: Self-pay | Admitting: *Deleted

## 2013-08-17 DIAGNOSIS — E785 Hyperlipidemia, unspecified: Secondary | ICD-10-CM

## 2013-08-17 DIAGNOSIS — Z79899 Other long term (current) drug therapy: Secondary | ICD-10-CM

## 2013-08-17 NOTE — Telephone Encounter (Signed)
Pt is coming in on 09.22.2014 what labs and dx?  

## 2013-08-20 ENCOUNTER — Other Ambulatory Visit (INDEPENDENT_AMBULATORY_CARE_PROVIDER_SITE_OTHER): Payer: PRIVATE HEALTH INSURANCE

## 2013-08-20 DIAGNOSIS — Z79899 Other long term (current) drug therapy: Secondary | ICD-10-CM

## 2013-08-20 DIAGNOSIS — E785 Hyperlipidemia, unspecified: Secondary | ICD-10-CM

## 2013-08-20 LAB — COMPREHENSIVE METABOLIC PANEL
ALT: 16 U/L (ref 0–35)
AST: 20 U/L (ref 0–37)
Albumin: 3.9 g/dL (ref 3.5–5.2)
Alkaline Phosphatase: 74 U/L (ref 39–117)
BUN: 11 mg/dL (ref 6–23)
Chloride: 103 mEq/L (ref 96–112)
Potassium: 4.1 mEq/L (ref 3.5–5.1)
Sodium: 139 mEq/L (ref 135–145)

## 2013-08-20 LAB — LIPID PANEL
Total CHOL/HDL Ratio: 7
Triglycerides: 103 mg/dL (ref 0.0–149.0)

## 2013-08-20 LAB — LDL CHOLESTEROL, DIRECT: Direct LDL: 304.7 mg/dL

## 2013-08-21 ENCOUNTER — Other Ambulatory Visit: Payer: Self-pay | Admitting: Internal Medicine

## 2013-08-21 ENCOUNTER — Encounter: Payer: Self-pay | Admitting: Internal Medicine

## 2013-08-21 DIAGNOSIS — E785 Hyperlipidemia, unspecified: Secondary | ICD-10-CM

## 2013-08-21 MED ORDER — ATORVASTATIN CALCIUM 20 MG PO TABS: 20.0000 mg | ORAL_TABLET | Freq: Every day | ORAL | Status: DC

## 2013-08-23 NOTE — Telephone Encounter (Signed)
Mailed unread message to pt  

## 2013-08-30 ENCOUNTER — Telehealth: Payer: Self-pay | Admitting: Internal Medicine

## 2013-08-30 NOTE — Telephone Encounter (Signed)
Pt states she has not heard back about cholesterol results from September.  Advised pt mychart msg was sent.  Pt states she was unable to get into mychart.  Please contact pt with cholesterol results.

## 2013-08-31 NOTE — Telephone Encounter (Signed)
Patient notified of results of labs and will return call to schedule lab appointment.

## 2013-09-14 ENCOUNTER — Ambulatory Visit: Payer: PRIVATE HEALTH INSURANCE

## 2013-09-20 ENCOUNTER — Telehealth: Payer: Self-pay | Admitting: Internal Medicine

## 2013-09-20 MED ORDER — CITALOPRAM HYDROBROMIDE 40 MG PO TABS: 40.0000 mg | ORAL_TABLET | Freq: Every day | ORAL | Status: DC

## 2013-09-20 NOTE — Telephone Encounter (Signed)
Please advise to refill 

## 2013-09-20 NOTE — Telephone Encounter (Signed)
New rx for 40 mg celexa sent to [facility]

## 2013-09-20 NOTE — Telephone Encounter (Signed)
The patient's counselor Berneice Heinrich sent a fax over in reference to her Celexa going up to 40 mg . The patient has 20 mg tablets she has been doubling this medication per Berneice Heinrich her counselor . If this recommendation is ok with Dr. Darrick Huntsman the patient will need another prescription called into the pharmacy ,because she will run out of her medication.

## 2013-09-20 NOTE — Telephone Encounter (Signed)
Left detailed message for patient that script has been called to pharmacy.

## 2013-10-04 ENCOUNTER — Other Ambulatory Visit: Payer: Self-pay

## 2013-10-12 ENCOUNTER — Ambulatory Visit: Payer: PRIVATE HEALTH INSURANCE

## 2013-10-15 ENCOUNTER — Other Ambulatory Visit (INDEPENDENT_AMBULATORY_CARE_PROVIDER_SITE_OTHER): Payer: PRIVATE HEALTH INSURANCE

## 2013-10-15 DIAGNOSIS — E785 Hyperlipidemia, unspecified: Secondary | ICD-10-CM

## 2013-10-16 LAB — COMPREHENSIVE METABOLIC PANEL
ALT: 20 U/L (ref 0–35)
AST: 27 U/L (ref 0–37)
Calcium: 9.3 mg/dL (ref 8.4–10.5)
Chloride: 103 mEq/L (ref 96–112)
Creatinine, Ser: 0.8 mg/dL (ref 0.4–1.2)
Sodium: 140 mEq/L (ref 135–145)
Total Bilirubin: 0.4 mg/dL (ref 0.3–1.2)
Total Protein: 7.4 g/dL (ref 6.0–8.3)

## 2013-10-16 LAB — LIPID PANEL
HDL: 49.2 mg/dL (ref >39.00)
VLDL: 40.4 mg/dL — ABNORMAL HIGH (ref 0.0–40.0)

## 2013-10-16 LAB — LDL CHOLESTEROL, DIRECT: Direct LDL: 185.6 mg/dL

## 2013-10-17 ENCOUNTER — Encounter: Payer: Self-pay | Admitting: Internal Medicine

## 2013-10-17 ENCOUNTER — Other Ambulatory Visit: Payer: Self-pay | Admitting: Internal Medicine

## 2013-10-17 ENCOUNTER — Telehealth: Payer: Self-pay | Admitting: Internal Medicine

## 2013-10-17 MED ORDER — ATORVASTATIN CALCIUM 40 MG PO TABS: 40.0000 mg | ORAL_TABLET | Freq: Every day | ORAL | Status: DC

## 2013-10-17 NOTE — Telephone Encounter (Signed)
Please advise 

## 2013-10-17 NOTE — Telephone Encounter (Signed)
I never saw them.  They went straight to her chart.  What is going on with [facility] ?    Her LDL has dropped from > 300 to 184,  Which is good but I would like to get it down a little more, if she is tolerating the atorvastatin,  i will increase it to 40 mg daily with next refills. She has 90 days of the 20 mg tablet so she can double it for now. Liver tests are fine

## 2013-10-17 NOTE — Telephone Encounter (Signed)
Pt called checking on labs results done Monday

## 2013-10-17 NOTE — Telephone Encounter (Signed)
Patient notified of results and instructions for medication voiced understanding.

## 2013-10-19 NOTE — Telephone Encounter (Signed)
Mailed unread message to pt  

## 2013-11-09 ENCOUNTER — Ambulatory Visit: Payer: PRIVATE HEALTH INSURANCE | Admitting: Internal Medicine

## 2013-11-20 ENCOUNTER — Other Ambulatory Visit: Payer: Self-pay | Admitting: Internal Medicine

## 2013-11-30 ENCOUNTER — Ambulatory Visit (INDEPENDENT_AMBULATORY_CARE_PROVIDER_SITE_OTHER): Payer: PRIVATE HEALTH INSURANCE | Admitting: Internal Medicine

## 2013-11-30 ENCOUNTER — Encounter: Payer: Self-pay | Admitting: Internal Medicine

## 2013-11-30 VITALS — BP 128/70 | HR 78 | Temp 98.3°F | Wt 171.0 lb

## 2013-11-30 DIAGNOSIS — F32A Depression, unspecified: Secondary | ICD-10-CM

## 2013-11-30 DIAGNOSIS — Z888 Allergy status to other drugs, medicaments and biological substances status: Secondary | ICD-10-CM

## 2013-11-30 DIAGNOSIS — F341 Dysthymic disorder: Secondary | ICD-10-CM

## 2013-11-30 DIAGNOSIS — F419 Anxiety disorder, unspecified: Principal | ICD-10-CM

## 2013-11-30 DIAGNOSIS — Z789 Other specified health status: Secondary | ICD-10-CM

## 2013-11-30 DIAGNOSIS — F329 Major depressive disorder, single episode, unspecified: Secondary | ICD-10-CM

## 2013-11-30 DIAGNOSIS — M2550 Pain in unspecified joint: Secondary | ICD-10-CM

## 2013-11-30 MED ORDER — BUSPIRONE HCL 5 MG PO TABS: 5.0000 mg | ORAL_TABLET | Freq: Three times a day (TID) | ORAL | Status: DC

## 2013-11-30 NOTE — Progress Notes (Signed)
Patient ID: Nichole Riddle, female   DOB: 01-12-1961, 53 y.o.   MRN: [id number]   Patient Active Problem List   Diagnosis Date Noted  . Statin intolerance 12/02/2013  . Encounter for preventive health examination 08/05/2013  . Polyarthralgia 03/11/2013  . Anxiety and depression 10/27/2012  . Irritable bowel   . Hyperlipidemia LDL goal < 130 02/20/2012  . Menopause 10/02/2011  . Obesity 10/02/2011  . Headache, migraine 09/29/2011  . History of colonoscopy 09/29/2011  . History of gastric ulcer   . History of cardiac catheterization     Subjective:  CC:   Chief Complaint  Patient presents with  . discuss meds    HPI:   Nichole Riddle a 53 y.o. female who presents Follow up on chronic conditions including anxiety and hyperlipidemia.  She had not tolerated continued use of generic lipitor due to persistent myalgias and flu like symptoms which resolved when she suspended the medication several eeks ago  Her anxiety uncontrolled.  Recent stressors include the protracted final separation from her boyfriend who has developed a cocaine addiction and has been lying to her for over a year, . She has moved out of their rented apartment for the intermim and  is living with her mother. She is able to work but finds herself flying off the handle frequently and feeling angry all the time despite the increase in celexa to 40 mg several months ago. She is using alprazolam daily because the clonazepam does not seem to provide any benefit.  She is not sleeping well.  She continues to see her therapist Armandina Stammer regularly but is requesting additional medication to handle her anxiety .    Past Medical History  Diagnosis Date  . Gastric ulcer 2012    NSAID induced   . History of cardiac catheterization 2012    normal   . Gastric ulcer due to Helicobacter pylori 7169    and NSAID use  . Irritable bowel     with alternating constipation/bloating and diarrhea    No past surgical history on  file.     The following portions of the patient's history were reviewed and updated as appropriate: Allergies, current medications, and problem list.    Review of Systems:   Patient denies headache, fevers, malaise, unintentional weight loss, skin rash, eye pain, sinus congestion and sinus pain, sore throat, dysphagia,  hemoptysis , cough, dyspnea, wheezing, chest pain, palpitations, orthopnea, edema, abdominal pain, nausea, melena, diarrhea, constipation, flank pain, dysuria, hematuria, urinary  Frequency, nocturia, numbness, tingling, seizures,  Focal weakness, Loss of consciousness,  Tremor,  and suicidal ideation.     History   Social History  . Marital Status: Single    Spouse Name: N/A    Number of Children: N/A  . Years of Education: N/A   Occupational History  . nurses aide     Hospice   Social History Main Topics  . Smoking status: Never Smoker   . Smokeless tobacco: Never Used  . Alcohol Use: Yes  . Drug Use: No  . Sexual Activity: Not on file   Other Topics Concern  . Not on file   Social History Narrative  . No narrative on file    Objective:  Filed Vitals:   11/30/13 0856  BP: 128/70  Pulse: 78  Temp: 98.3 F (36.8 C)     General appearance: alert, cooperative and appears stated age Ears: normal TM's and external ear canals both ears Throat: lips, mucosa, and tongue  normal; teeth and gums normal Neck: no adenopathy, no carotid bruit, supple, symmetrical, trachea midline and thyroid not enlarged, symmetric, no tenderness/mass/nodules Back: symmetric, no curvature. ROM normal. No CVA tenderness. Lungs: clear to auscultation bilaterally Heart: regular rate and rhythm, S1, S2 normal, no murmur, click, rub or gallop Abdomen: soft, non-tender; bowel sounds normal; no masses,  no organomegaly Pulses: 2+ and symmetric Skin: Skin color, texture, turgor normal. No rashes or lesions Lymph nodes: Cervical, supraclavicular, and axillary nodes  normal. Psych: anxious, tearful, speech not pressured. Makes good eye contact   Assessment and Plan:  Anxiety and depression Aggravated by dissolution of relationship with long term boyfriend due to discovery of his cocaine addiction ;.  Continue citalopram 40 mg and regular follow up with therapist,  Adding buspirone 5 mg tid for anxiety due to increased need for alprazolam  Polyarthralgia improved with use of lipitor but medication has not  Been tolerated due to persistent diffuse myalgias and fly like symptoms.  Medication stopped.  Will rspeat fasting lipids in 6 months and consider repeat trial of QOD dosing and daily CoQ 10   Statin intolerance Myalgias and flu like symptoms with trial of atorvastatin   A total of 40 minutes was spent with patient more than half of which was spent in counseling and coordination of care.  Updated Medication List Outpatient Encounter Prescriptions as of 11/30/2013  Medication Sig  . ALPRAZolam (XANAX) 0.5 MG tablet Take 1 tablet (0.5 mg total) by mouth at bedtime as needed for sleep.  . Azelastine-Fluticasone (DYMISTA) 137-50 MCG/ACT SUSP Place 2 Squirts into the nose 2 (two) times daily. In each nostril  . b complex vitamins tablet Take 1 tablet by mouth daily.  . citalopram (CELEXA) 40 MG tablet Take 1 tablet (40 mg total) by mouth daily.  . clonazePAM (KLONOPIN) 1 MG tablet Take 1 tablet (1 mg total) by mouth 2 (two) times daily as needed for anxiety.  [name] dexlansoprazole (DEXILANT) 60 MG capsule Take 1 capsule (60 mg total) by mouth daily.  . diphenoxylate-atropine (LOMOTIL) 2.5-0.025 MG/5ML liquid Take 5 mLs by mouth 4 (four) times daily as needed.  [name] glucosamine-chondroitin 500-400 MG tablet Take 1 tablet by mouth 2 (two) times daily.  . nabumetone (RELAFEN) 750 MG tablet Take 1 tablet (750 mg total) by mouth 2 (two) times daily as needed for pain.  . promethazine (PHENERGAN) 25 MG tablet Take 1 tablet (25 mg total) by mouth every 8 (eight) hours as  needed for nausea.  . rizatriptan (MAXALT-MLT) 10 MG disintegrating tablet DISSOLVE 1 TABLET ON TONGUE AS NEEDED MIGRAINE-MAY REPEAT IN 2 HOURS IF NEEDED  . spironolactone (ALDACTONE) 50 MG tablet Take 1 tablet (50 mg total) by mouth daily.  . busPIRone (BUSPAR) 5 MG tablet Take 1 tablet (5 mg total) by mouth 3 (three) times daily.  . [DISCONTINUED] atorvastatin (LIPITOR) 40 MG tablet Take 1 tablet (40 mg total) by mouth daily.     No orders of the defined types were placed in this encounter.    No Follow-up on file.

## 2013-11-30 NOTE — Progress Notes (Signed)
Pre-visit discussion using our clinic review tool. No additional management support is needed unless otherwise documented below in the visit note.  

## 2013-11-30 NOTE — Patient Instructions (Signed)
I agree with stopping the lipitor for now.  We will repeat your cholesterol in  3 or 4 months without meds   I am  Starting you on buspirone for your anxiety,   You can start immediately and take it revery 8 hours  You may not need as much alprazolam or clonazepam.  Use them as needed

## 2013-12-02 ENCOUNTER — Encounter: Payer: Self-pay | Admitting: Internal Medicine

## 2013-12-02 DIAGNOSIS — Z789 Other specified health status: Secondary | ICD-10-CM | POA: Insufficient documentation

## 2013-12-02 DIAGNOSIS — IMO0002 Reserved for concepts with insufficient information to code with codable children: Secondary | ICD-10-CM | POA: Insufficient documentation

## 2013-12-02 NOTE — Assessment & Plan Note (Signed)
Myalgias and flu like symptoms with trial of atorvastatin

## 2013-12-02 NOTE — Assessment & Plan Note (Signed)
Aggravated by dissolution of relationship with long term boyfriend due to discovery of his cocaine addiction ;.  Continue citalopram 40 mg and regular follow up with therapist,  Adding buspirone 5 mg tid for anxiety due to increased need for alprazolam

## 2013-12-02 NOTE — Assessment & Plan Note (Signed)
improved with use of lipitor but medication has not  Been tolerated due to persistent diffuse myalgias and fly like symptoms.  Medication stopped.  Will rspeat fasting lipids in 6 months and consider repeat trial of QOD dosing and daily CoQ 10

## 2013-12-28 ENCOUNTER — Telehealth: Payer: Self-pay | Admitting: Internal Medicine

## 2013-12-28 DIAGNOSIS — F410 Panic disorder [episodic paroxysmal anxiety] without agoraphobia: Secondary | ICD-10-CM

## 2013-12-28 MED ORDER — CLONAZEPAM 1 MG PO TABS: 1.0000 mg | ORAL_TABLET | Freq: Two times a day (BID) | ORAL | Status: DC | PRN

## 2013-12-28 NOTE — Telephone Encounter (Signed)
Left detailed message Rx sent to pharmacy. Rx faxed to pharmacy.

## 2013-12-28 NOTE — Telephone Encounter (Signed)
Pt states she needs clonazepam refill.  States we have to send authorization to pharmacy.

## 2013-12-28 NOTE — Telephone Encounter (Signed)
Please advise if okay to refill Clonazepam? Pt was seen last 11/30/2013.

## 2013-12-28 NOTE — Telephone Encounter (Signed)
Ok to refill,  printed rx  

## 2014-01-03 ENCOUNTER — Telehealth: Payer: Self-pay | Admitting: Internal Medicine

## 2014-01-03 DIAGNOSIS — G4700 Insomnia, unspecified: Secondary | ICD-10-CM

## 2014-01-03 MED ORDER — ALPRAZOLAM 0.5 MG PO TABS: 0.5000 mg | ORAL_TABLET | Freq: Every evening | ORAL | Status: DC | PRN

## 2014-01-03 NOTE — Telephone Encounter (Signed)
Ok to refill,  Authorized in epic 

## 2014-01-03 NOTE — Telephone Encounter (Signed)
Patient does not need prior authorization just refill is okay to fill?

## 2014-01-03 NOTE — Telephone Encounter (Signed)
Pt called stating she needed a prior autho for her xanax sent to [facility]   [phone] Pt is competely out of meds

## 2014-01-04 NOTE — Telephone Encounter (Signed)
Medication called to pharmacy. 

## 2014-01-18 ENCOUNTER — Ambulatory Visit: Payer: PRIVATE HEALTH INSURANCE | Admitting: Internal Medicine

## 2014-02-06 ENCOUNTER — Ambulatory Visit: Payer: PRIVATE HEALTH INSURANCE | Admitting: Internal Medicine

## 2014-03-05 ENCOUNTER — Telehealth: Payer: Self-pay | Admitting: Internal Medicine

## 2014-03-05 MED ORDER — RIZATRIPTAN BENZOATE 10 MG PO TBDP: ORAL_TABLET | ORAL | Status: DC

## 2014-03-05 NOTE — Telephone Encounter (Signed)
The patient has ran out of her migraine medication. The pharmacy will not refill it until the 4.10.15 . She is wanting a new prescription sent to the pharmacy because she has a migraine today. Please advise.

## 2014-03-05 NOTE — Telephone Encounter (Signed)
New rx sent

## 2014-03-05 NOTE — Telephone Encounter (Signed)
Please advise OK to fill? 

## 2014-03-11 ENCOUNTER — Encounter: Payer: Self-pay | Admitting: Internal Medicine

## 2014-03-11 ENCOUNTER — Ambulatory Visit (INDEPENDENT_AMBULATORY_CARE_PROVIDER_SITE_OTHER): Payer: PRIVATE HEALTH INSURANCE | Admitting: Internal Medicine

## 2014-03-11 VITALS — BP 116/68 | HR 73 | Temp 97.8°F | Resp 16 | Wt 174.0 lb

## 2014-03-11 DIAGNOSIS — R599 Enlarged lymph nodes, unspecified: Secondary | ICD-10-CM

## 2014-03-11 DIAGNOSIS — R590 Localized enlarged lymph nodes: Secondary | ICD-10-CM

## 2014-03-11 DIAGNOSIS — R221 Localized swelling, mass and lump, neck: Secondary | ICD-10-CM

## 2014-03-11 DIAGNOSIS — R220 Localized swelling, mass and lump, head: Secondary | ICD-10-CM

## 2014-03-11 NOTE — Progress Notes (Signed)
Pre-visit discussion using our clinic review tool. No additional management support is needed unless otherwise documented below in the visit note.  

## 2014-03-11 NOTE — Progress Notes (Signed)
Patient ID: Nichole Riddle, female   DOB: 09/28/1961, 53 y.o.   MRN: [id number]  Patient Active Problem List   Diagnosis Date Noted  . Enlarged submental lymph node 03/12/2014  . Statin intolerance 12/02/2013  . Encounter for preventive health examination 08/05/2013  . Polyarthralgia 03/11/2013  . Anxiety and depression 10/27/2012  . Irritable bowel   . Hyperlipidemia LDL goal < 130 02/20/2012  . Menopause 10/02/2011  . Obesity 10/02/2011  . Headache, migraine 09/29/2011  . History of colonoscopy 09/29/2011  . History of gastric ulcer   . History of cardiac catheterization     Subjective:  CC:   Chief Complaint  Patient presents with  . Acute Visit    swollen lymph node maybe.    HPI:   Nichole Riddle is a 53 y.o. female who presents for 4 week history of painful area under chin on right side, accompanied by nodular feeling about  pea sized in right submental area .  No recent URI,  Last time in January,  Does not smoke, no history of goiter or CA   Past Medical History  Diagnosis Date  . Gastric ulcer 2012    NSAID induced   . History of cardiac catheterization 2012    normal   . Gastric ulcer due to Helicobacter pylori 4098    and NSAID use  . Irritable bowel     with alternating constipation/bloating and diarrhea    No past surgical history on file.     The following portions of the patient's history were reviewed and updated as appropriate: Allergies, current medications, and problem list.    Review of Systems:   Patient denies headache, fevers, malaise, unintentional weight loss, skin rash, eye pain, sinus congestion and sinus pain, sore throat, dysphagia,  hemoptysis , cough, dyspnea, wheezing, chest pain, palpitations, orthopnea, edema, abdominal pain, nausea, melena, diarrhea, constipation, flank pain, dysuria, hematuria, urinary  Frequency, nocturia, numbness, tingling, seizures,  Focal weakness, Loss of consciousness,  Tremor, insomnia, depression,  anxiety, and suicidal ideation.     History   Social History  . Marital Status: Single    Spouse Name: N/A    Number of Children: N/A  . Years of Education: N/A   Occupational History  . nurses aide     Hospice   Social History Main Topics  . Smoking status: Never Smoker   . Smokeless tobacco: Never Used  . Alcohol Use: Yes  . Drug Use: No  . Sexual Activity: Not on file   Other Topics Concern  . Not on file   Social History Narrative  . No narrative on file    Objective:  Filed Vitals:   03/11/14 1809  BP: 116/68  Pulse: 73  Temp: 97.8 F (36.6 C)  Resp: 16     General appearance: alert, cooperative and appears stated age Ears: normal TM's and external ear canals both ears Throat: lips, mucosa, and tongue normal; teeth and gums normal Neck: tender pea sized area under right side of chin.   nodular.     not e Back: symmetric, no curvature. ROM normal. No CVA tenderness. Lungs: clear to auscultation bilaterally Heart: regular rate and rhythm, S1, S2 normal, no murmur, click, rub or gallop Abdomen: soft, non-tender; bowel sounds normal; no masses,  no organomegaly Pulses: 2+ and symmetric Skin: Skin color, texture, turgor normal. No rashes or lesions Lymph nodes: Cervical, supraclavicular, and axillary nodes normal.  Assessment and Plan:  Enlarged submental lymph node Vs sialoadenitis.  Advised to suck on lemon wedges as a trial   Ultrasound of neck ordered.    Updated Medication List Outpatient Encounter Prescriptions as of 03/11/2014  Medication Sig  . ALPRAZolam (XANAX) 0.5 MG tablet Take 1 tablet (0.5 mg total) by mouth at bedtime as needed for sleep.  [name] b complex vitamins tablet Take 1 tablet by mouth daily.  . citalopram (CELEXA) 40 MG tablet Take 1 tablet (40 mg total) by mouth daily.  . clonazePAM (KLONOPIN) 1 MG tablet Take 1 tablet (1 mg total) by mouth 2 (two) times daily as needed for anxiety.  [name] glucosamine-chondroitin 500-400 MG tablet Take  1 tablet by mouth 2 (two) times daily.  . promethazine (PHENERGAN) 25 MG tablet Take 1 tablet (25 mg total) by mouth every 8 (eight) hours as needed for nausea.  . rizatriptan (MAXALT-MLT) 10 MG disintegrating tablet DISSOLVE 1 TABLET ON TONGUE AS NEEDED MIGRAINE-MAY REPEAT IN 2 HOURS IF NEEDED  . spironolactone (ALDACTONE) 50 MG tablet Take 1 tablet (50 mg total) by mouth daily.  . Azelastine-Fluticasone (DYMISTA) 137-50 MCG/ACT SUSP Place 2 Squirts into the nose 2 (two) times daily. In each nostril  . busPIRone (BUSPAR) 5 MG tablet Take 1 tablet (5 mg total) by mouth 3 (three) times daily.  [name] dexlansoprazole (DEXILANT) 60 MG capsule Take 1 capsule (60 mg total) by mouth daily.  . diphenoxylate-atropine (LOMOTIL) 2.5-0.025 MG/5ML liquid Take 5 mLs by mouth 4 (four) times daily as needed.  . nabumetone (RELAFEN) 750 MG tablet Take 1 tablet (750 mg total) by mouth 2 (two) times daily as needed for pain.     Orders Placed This Encounter  Procedures  . US Soft Tissue Head/Neck    No Follow-up on file.

## 2014-03-12 DIAGNOSIS — R590 Localized enlarged lymph nodes: Secondary | ICD-10-CM | POA: Insufficient documentation

## 2014-03-12 NOTE — Assessment & Plan Note (Signed)
Vs sialoadenitis. Advised to suck on lemon wedges as a trial   Ultrasound of neck ordered.

## 2014-03-15 ENCOUNTER — Ambulatory Visit: Payer: Self-pay | Admitting: Internal Medicine

## 2014-03-18 ENCOUNTER — Telehealth: Payer: Self-pay | Admitting: Internal Medicine

## 2014-03-18 DIAGNOSIS — R590 Localized enlarged lymph nodes: Secondary | ICD-10-CM

## 2014-03-18 NOTE — Telephone Encounter (Signed)
Patient ready to proceed with CT, patient notified that blood tinged tissue probably from irritation and voiced understanding,

## 2014-03-18 NOTE — Assessment & Plan Note (Signed)
2 cm nodule found, etiology (LN vs other) unclear .  CT recommended, vs ENT eval

## 2014-03-18 NOTE — Telephone Encounter (Signed)
Left message for patient to return call to office. 

## 2014-03-18 NOTE — Telephone Encounter (Signed)
Notified patient of [place] and patient is ready to proceed with ultra sound, patient wanted to make you aware over the weekend patient had some nausea and emesis, which after wards patient cleared throat and tissue paper was blood tinged. Patient thought this was from emesis irritating throat and then next day patient cleared throat in morning and blood tinged tissue again. Please advise.

## 2014-03-18 NOTE — Telephone Encounter (Signed)
He neck ultrasound showed a 2 cm nodule that could be a reactive lymph node but could they could nor be sure Of course they are recommending a contrasted  CT of the neck for more detail  .  Is she willing to proceed? Other choice would be have ENT see her first.

## 2014-03-18 NOTE — Telephone Encounter (Signed)
It is too soon after her emesis to say whether the blood was from sinuses or throat.  If it hsa resolved,  it was probably from esophageal irritation    Is she ready to proceed with CT?

## 2014-03-28 ENCOUNTER — Telehealth: Payer: Self-pay | Admitting: Internal Medicine

## 2014-03-28 NOTE — Telephone Encounter (Signed)
Approval from [place] [id number] Valid from 4.28.15 - 5.28.15.

## 2014-04-02 ENCOUNTER — Ambulatory Visit (HOSPITAL_COMMUNITY): Admission: RE | Admit: 2014-04-02 | Payer: PRIVATE HEALTH INSURANCE | Source: Ambulatory Visit

## 2014-04-05 ENCOUNTER — Ambulatory Visit (HOSPITAL_COMMUNITY)
Admission: RE | Admit: 2014-04-05 | Discharge: 2014-04-05 | Disposition: A | Discharge status: Home / Self Care | Payer: PRIVATE HEALTH INSURANCE | Source: Ambulatory Visit | Attending: Internal Medicine | Admitting: Internal Medicine

## 2014-04-05 DIAGNOSIS — R590 Localized enlarged lymph nodes: Secondary | ICD-10-CM

## 2014-04-05 DIAGNOSIS — R599 Enlarged lymph nodes, unspecified: Secondary | ICD-10-CM | POA: Insufficient documentation

## 2014-04-05 MED ORDER — IOHEXOL 300 MG/ML  SOLN
75.0000 mL | Freq: Once | INTRAMUSCULAR | Status: AC | PRN
  Administered 2014-04-05: 75 mL via INTRAVENOUS

## 2014-04-07 ENCOUNTER — Encounter: Payer: Self-pay | Admitting: Internal Medicine

## 2014-04-09 ENCOUNTER — Telehealth: Payer: Self-pay | Admitting: Internal Medicine

## 2014-04-09 ENCOUNTER — Encounter: Payer: Self-pay | Admitting: Internal Medicine

## 2014-04-09 NOTE — Telephone Encounter (Signed)
Informed patient of the results from the unread Mychart message. She verbalized understanding and has already been seen by ENT. Also let patient know, we do not physically have the CT results at this moment. They have been sent to scanning but once we received them we could fax a copy. Per patient she will call the radiology department and have them fax the results to ENT.

## 2014-04-09 NOTE — Telephone Encounter (Signed)
Read her e mail .  i sent it 5/10

## 2014-04-09 NOTE — Telephone Encounter (Signed)
Please advise 

## 2014-04-09 NOTE — Telephone Encounter (Signed)
The patient is wanting the results from her neck CT . She also wants those results sent to Dr. Richardson Landry @ [facility]

## 2014-04-12 ENCOUNTER — Encounter: Payer: Self-pay | Admitting: Adult Health

## 2014-04-12 ENCOUNTER — Ambulatory Visit (INDEPENDENT_AMBULATORY_CARE_PROVIDER_SITE_OTHER): Payer: PRIVATE HEALTH INSURANCE | Admitting: Adult Health

## 2014-04-12 VITALS — BP 111/80 | HR 84 | Temp 98.0°F | Resp 16 | Wt 175.8 lb

## 2014-04-12 DIAGNOSIS — R110 Nausea: Secondary | ICD-10-CM

## 2014-04-12 DIAGNOSIS — J329 Chronic sinusitis, unspecified: Secondary | ICD-10-CM

## 2014-04-12 DIAGNOSIS — J020 Streptococcal pharyngitis: Secondary | ICD-10-CM

## 2014-04-12 LAB — POCT RAPID STREP A (OFFICE): RAPID STREP A SCREEN: NEGATIVE

## 2014-04-12 LAB — POCT INFLUENZA A/B
INFLUENZA A, POC: NEGATIVE
INFLUENZA B, POC: NEGATIVE

## 2014-04-12 MED ORDER — GUAIFENESIN-CODEINE 100-10 MG/5ML PO SOLN
5.0000 mL | Freq: Three times a day (TID) | ORAL | Status: DC | PRN
Start: 2014-04-12 — End: 2014-04-16

## 2014-04-12 MED ORDER — PROMETHAZINE HCL 25 MG PO TABS: 25.0000 mg | ORAL_TABLET | Freq: Three times a day (TID) | ORAL | Status: DC | PRN

## 2014-04-12 MED ORDER — AMOXICILLIN-POT CLAVULANATE 875-125 MG PO TABS: 1.0000 | ORAL_TABLET | Freq: Two times a day (BID) | ORAL | Status: DC

## 2014-04-12 NOTE — Progress Notes (Signed)
Patient ID: Nichole Riddle, female   DOB: 06-Jul-1961, 53 y.o.   MRN: [id number]   Subjective:    Patient ID: Nichole Riddle, female    DOB: Jul 05, 1961, 53 y.o.   MRN: [id number]  HPI  Pt is a 53 y/o female who presents to clinic with significant cough, sore throat, body aches, low grade temp, sinus pressure, post nasal drip since Wednesday. She reports that she always gets sick around this time of year. Has been taking tylenol for her body aches.   Past Medical History  Diagnosis Date  . Gastric ulcer 2012    NSAID induced   . History of cardiac catheterization 2012    normal   . Gastric ulcer due to Helicobacter pylori 4970    and NSAID use  . Irritable bowel     with alternating constipation/bloating and diarrhea    Current Outpatient Prescriptions on File Prior to Visit  Medication Sig Dispense Refill  . ALPRAZolam (XANAX) 0.5 MG tablet Take 1 tablet (0.5 mg total) by mouth at bedtime as needed for sleep.  30 tablet  3  . b complex vitamins tablet Take 1 tablet by mouth daily.      . citalopram (CELEXA) 40 MG tablet Take 1 tablet (40 mg total) by mouth daily.  90 tablet  1  . glucosamine-chondroitin 500-400 MG tablet Take 1 tablet by mouth 2 (two) times daily.      . promethazine (PHENERGAN) 25 MG tablet Take 1 tablet (25 mg total) by mouth every 8 (eight) hours as needed for nausea.  30 tablet  1  . [DISCONTINUED] estradiol (ESTRACE) 0.5 MG tablet Take 1 tablet (0.5 mg total) by mouth daily.  30 tablet  11  . [DISCONTINUED] simvastatin (ZOCOR) 40 MG tablet Take 1 tablet (40 mg total) by mouth every evening.  90 tablet  2   No current facility-administered medications on file prior to visit.     Review of Systems  Constitutional: Positive for fever and chills.  HENT: Positive for congestion, postnasal drip, rhinorrhea, sinus pressure, sneezing, sore throat and voice change.   Respiratory: Positive for cough. Negative for chest tightness and wheezing.   Musculoskeletal:   Swollen and painful right hand s/p injury  Neurological: Negative for headaches.  All other systems reviewed and are negative.      Objective:  BP 111/80  Pulse 84  Temp(Src) 98 F (36.7 C) (Oral)  Resp 16  Wt 175 lb 12 oz (79.72 kg)  SpO2 97%   Physical Exam  Constitutional: She is oriented to person, place, and time. She appears well-developed and well-nourished. No distress.  Acutely ill  HENT:  Head: Normocephalic and atraumatic.  Right Ear: External ear normal.  Left Ear: External ear normal.  Mouth/Throat: No oropharyngeal exudate.  Pharyngeal erythema  Cardiovascular: Normal rate, regular rhythm and normal heart sounds.  Exam reveals no gallop.   No murmur heard. Pulmonary/Chest: Effort normal and breath sounds normal. No respiratory distress. She has no wheezes. She has no rales.  Lymphadenopathy:    She has cervical adenopathy.  Neurological: She is alert and oriented to person, place, and time.  Psychiatric: She has a normal mood and affect. Her behavior is normal. Judgment and thought content normal.      Assessment & Plan:   1. Sinusitis Augmentin x10 days; Robitussin-AC for severe cough. Please see patient instruction for full plan of care Strep negative, Influenza negative

## 2014-04-12 NOTE — Patient Instructions (Signed)
  Start Augmentin 1 tablet twice a day for 10 days.  Robitussin-AC for cough. This contains codeine and may cause sedation. Do not drive while taking this medication.  Continue Tylenol for general body discomfort and fever.  If you experience severe nasal congestion, you may use Afrin nasal spray for 3 days only  Recommend that you begin taking any antihistamine such as Claritin, Allegra or Zyrtec before allergy season begins - before spring and also before the fall.  Call if no improvement within 4-5 days or sooner if necessary

## 2014-04-16 ENCOUNTER — Ambulatory Visit (INDEPENDENT_AMBULATORY_CARE_PROVIDER_SITE_OTHER): Payer: PRIVATE HEALTH INSURANCE | Admitting: Family Medicine

## 2014-04-16 ENCOUNTER — Encounter: Payer: Self-pay | Admitting: Family Medicine

## 2014-04-16 ENCOUNTER — Telehealth: Payer: Self-pay | Admitting: Internal Medicine

## 2014-04-16 VITALS — BP 120/74 | HR 82 | Temp 97.4°F | Ht 64.0 in | Wt 174.5 lb

## 2014-04-16 DIAGNOSIS — J209 Acute bronchitis, unspecified: Secondary | ICD-10-CM

## 2014-04-16 DIAGNOSIS — J9801 Acute bronchospasm: Secondary | ICD-10-CM

## 2014-04-16 MED ORDER — GUAIFENESIN-CODEINE 100-10 MG/5ML PO SOLN: 5.0000 mL | Freq: Every evening | ORAL | Status: DC | PRN

## 2014-04-16 MED ORDER — ALBUTEROL SULFATE HFA 108 (90 BASE) MCG/ACT IN AERS: 2.0000 | INHALATION_SPRAY | Freq: Four times a day (QID) | RESPIRATORY_TRACT | Status: DC | PRN

## 2014-04-16 NOTE — Progress Notes (Signed)
   Subjective:    Patient ID: Nichole Riddle, female    DOB: [birth year], 53 y.o.   MRN: [id number]  Cough This is a new problem. The current episode started 1 to 4 weeks ago. The problem has been gradually worsening. The cough is productive of sputum. Associated symptoms include shortness of breath and wheezing. Associated symptoms comments: Chest tightness  lighthead, headache, SOB.  initially low grade temp, resolved now.. Risk factors: nonsmoker. She has tried prescription cough suppressant (augmentin, usingf codeine cough suppresant 3 times a day) for the symptoms. The treatment provided mild relief. Her past medical history is significant for bronchitis. There is no history of asthma, COPD or environmental allergies.  Wheezing  This is a new problem. The current episode started yesterday. Associated symptoms include coughing and shortness of breath. The symptoms are aggravated by any activity and exercise. There is no history of asthma or COPD.    Saw Dr. Marcie Bal on 5/14 dx with sinus infection. Neg strep test. Started on Augmentin x 10 days.  Review of Systems  Respiratory: Positive for cough, shortness of breath and wheezing.   Allergic/Immunologic: Negative for environmental allergies.       Objective:   Physical Exam  Constitutional: Vital signs are normal. She appears well-developed and well-nourished. She is cooperative.  Non-toxic appearance. She does not appear ill. No distress.  HENT:  Head: Normocephalic.  Right Ear: Hearing, tympanic membrane, external ear and ear canal normal. Tympanic membrane is not erythematous, not retracted and not bulging.  Left Ear: Hearing, tympanic membrane, external ear and ear canal normal. Tympanic membrane is not erythematous, not retracted and not bulging.  Nose: Mucosal edema and rhinorrhea present. Right sinus exhibits no maxillary sinus tenderness and no frontal sinus tenderness. Left sinus exhibits no maxillary sinus tenderness and no frontal  sinus tenderness.  Mouth/Throat: Uvula is midline, oropharynx is clear and moist and mucous membranes are normal.  Eyes: Conjunctivae, EOM and lids are normal. Pupils are equal, round, and reactive to light. Lids are everted and swept, no foreign bodies found.  Neck: Trachea normal and normal range of motion. Neck supple. Carotid bruit is not present. No mass and no thyromegaly present.  Cardiovascular: Normal rate, regular rhythm, S1 normal, S2 normal, normal heart sounds, intact distal pulses and normal pulses.  Exam reveals no gallop and no friction rub.   No murmur heard. Pulmonary/Chest: Effort normal and breath sounds normal. Not tachypneic. No respiratory distress. She has no decreased breath sounds. She has no wheezes. She has no rhonchi. She has no rales.  Constant cough, hard to take deep breath.  Neurological: She is alert.  Skin: Skin is warm, dry and intact. No rash noted.  Psychiatric: Her speech is normal and behavior is normal. Judgment normal. Her mood appears not anxious. Cognition and memory are normal. She does not exhibit a depressed mood.          Assessment & Plan:

## 2014-04-16 NOTE — Progress Notes (Signed)
Pre visit review using our clinic review tool, if applicable. No additional management support is needed unless otherwise documented below in the visit note. 

## 2014-04-16 NOTE — Telephone Encounter (Signed)
Patient Information:  Caller Name: Darlisha  Phone: [phone]  Patient: Nichole, Riddle  Gender: Female  DOB: 01-Jan-1961  Age: 53 Years  PCP: Deborra Medina (Adults only)  Pregnant: No  Office Follow Up:  Does the office need to follow up with this patient?: No  Instructions For The Office: N/A  RN Note:  Started Augmentin 04/12/14 for bronchitis, but states cough has worsened and now is constant and patient c/o shortness of breath at times.  Temp/tactile.  Per cough protocol, advised being seen within 4 hours due to presence of wheezing;  no appts available in office 04/16/14.  Appt scheduled per office protocol at [facility] 1300 04/16/14 with Dr. Diona Browner.  krs/can  Symptoms  Reason For Call & Symptoms: constant cough with wheeze  Reviewed Health History In EMR: Yes  Reviewed Medications In EMR: Yes  Reviewed Allergies In EMR: Yes  Reviewed Surgeries / Procedures: Yes  Date of Onset of Symptoms: 04/11/2014 OB / GYN:  LMP: Unknown  Guideline(s) Used:  Cough  Disposition Per Guideline:   Go to Office Now  Reason For Disposition Reached:   Wheezing is present  Advice Given:  N/A  Patient Will Follow Care Advice:  YES  Appointment Scheduled:  04/16/2014 13:00:00 Appointment Scheduled Provider:  Other

## 2014-04-16 NOTE — Telephone Encounter (Signed)
Pt states she is still feeling very bad.  Still coughing severely and is now getting short of breath especially when she climbs steps or moves around a lot.  States she has been taking antibiotic and cough medication prescribed at previous visit but is worsening.  States she does not think she is running a fever unless it is very low-grade.  Chest is tight and breathing hard.  Would like to see Dr. Derrel Nip if possible, but is fine with seeing Raquel.  No appt available.  Transferred to triage.

## 2014-04-16 NOTE — Telephone Encounter (Signed)
FYI

## 2014-04-16 NOTE — Patient Instructions (Addendum)
Start albuterol inhaler as needed for wheeze and shortness of breath. Complete antibiotics. Cough suppressant as needed at night.  Call if new fever or shortness of breath worsening.

## 2014-04-16 NOTE — Assessment & Plan Note (Signed)
On appropriate antibiotic.[name] Complete.  Start albuterol inhaler for bronchospasm/cough. Clear lung exam except constant hacky cough resulting in sensation of SOB.

## 2014-05-14 ENCOUNTER — Other Ambulatory Visit: Payer: Self-pay | Admitting: Internal Medicine

## 2014-05-14 ENCOUNTER — Ambulatory Visit (INDEPENDENT_AMBULATORY_CARE_PROVIDER_SITE_OTHER): Payer: PRIVATE HEALTH INSURANCE | Admitting: Internal Medicine

## 2014-05-14 ENCOUNTER — Encounter: Payer: Self-pay | Admitting: Internal Medicine

## 2014-05-14 ENCOUNTER — Telehealth: Payer: Self-pay | Admitting: Internal Medicine

## 2014-05-14 VITALS — BP 148/78 | HR 98 | Temp 97.6°F | Resp 20 | Ht 64.0 in | Wt 176.0 lb

## 2014-05-14 DIAGNOSIS — R590 Localized enlarged lymph nodes: Secondary | ICD-10-CM

## 2014-05-14 DIAGNOSIS — R7401 Elevation of levels of liver transaminase levels: Secondary | ICD-10-CM

## 2014-05-14 DIAGNOSIS — J209 Acute bronchitis, unspecified: Secondary | ICD-10-CM

## 2014-05-14 DIAGNOSIS — R002 Palpitations: Secondary | ICD-10-CM

## 2014-05-14 DIAGNOSIS — R740 Nonspecific elevation of levels of transaminase and lactic acid dehydrogenase [LDH]: Secondary | ICD-10-CM

## 2014-05-14 DIAGNOSIS — R599 Enlarged lymph nodes, unspecified: Secondary | ICD-10-CM

## 2014-05-14 DIAGNOSIS — R7402 Elevation of levels of lactic acid dehydrogenase (LDH): Secondary | ICD-10-CM

## 2014-05-14 MED ORDER — PANTOPRAZOLE SODIUM 40 MG PO TBEC: 40.0000 mg | DELAYED_RELEASE_TABLET | Freq: Every day | ORAL | Status: DC

## 2014-05-14 MED ORDER — GUAIFENESIN-CODEINE 100-10 MG/5ML PO SOLN: 5.0000 mL | Freq: Every evening | ORAL | Status: DC | PRN

## 2014-05-14 NOTE — Progress Notes (Signed)
Pre-visit discussion using our clinic review tool. No additional management support is needed unless otherwise documented below in the visit note.  

## 2014-05-14 NOTE — Telephone Encounter (Signed)
Patient Information:  Caller Name: Ronnika  Phone: [phone]  Patient: Nichole, Riddle  Gender: Female  DOB: 1961-09-13  Age: 53 Years  PCP: Deborra Medina (Adults only)  Pregnant: No  Office Follow Up:  Does the office need to follow up with this patient?: Yes  Instructions For The Office: Patient needs further instructions.  RN Note:  Patient reports she was taking Augmentin in late April/early May for bronchitis. She also reports she still has a cough at this time. Please contact her to give further instructions.  Symptoms  Reason For Call & Symptoms: Reports heart racing/fluttering yesterday, not today. Heart rate yesterday was up and down over 100 at times. Has not checked heart rate today. Patient says she feels more tired than normal also. Diarrhea is also present - approximately 6 bowel movements in the last 24 hours with 2 being diarrhea.  Reviewed Health History In EMR: Yes  Reviewed Medications In EMR: Yes  Reviewed Allergies In EMR: Yes  Reviewed Surgeries / Procedures: Yes  Date of Onset of Symptoms: 05/13/2014 OB / GYN:  LMP: Unknown  Guideline(s) Used:  Diarrhea  Disposition Per Guideline:   Callback by PCP Today  Reason For Disposition Reached:   Recent antibiotic therapy (i.e., within last 2 months)  Advice Given:  N/A  Patient Will Follow Care Advice:  YES

## 2014-05-14 NOTE — Progress Notes (Signed)
Patient ID: Nichole Riddle, female   DOB: [birth year], 53 y.o.   MRN: [id number]   Patient Active Problem List   Diagnosis Date Noted  . Transaminitis 05/15/2014  . Enlarged submental lymph node 03/12/2014  . Statin intolerance 12/02/2013  . Encounter for preventive health examination 08/05/2013  . Polyarthralgia 03/11/2013  . Anxiety and depression 10/27/2012  . Irritable bowel   . Hyperlipidemia LDL goal < 130 02/20/2012  . Menopause 10/02/2011  . Obesity 10/02/2011  . Headache, migraine 09/29/2011  . History of colonoscopy 09/29/2011  . History of gastric ulcer   . History of cardiac catheterization     Subjective:  CC:   Chief Complaint  Patient presents with  . Acute Visit    bronchitis, diarrhea,   . Cough  . Palpitations    HPI:   Nichole Riddle is a 53 y.o. female who presents for Persistent cough and malaise.   She was treated  On May 14 by RR with augmentin and zyrtec  for sinusitis with cough, then treated on May 19 by Eliezer Lofts for persistent productive cough with codeine and albuterol and prednisone taper which resolved  her  wheezing and the cough within 48 hours so she never used the albuterol.  She developed abdominal cramping and watery foul smelling diarrhea, which lasted several days before resolving, which she attributed to the prednisone.  She feels generally malaised and is having palpitations with exertion that started a few weeks ago. She has not been working outside in the heat for several weeks. She has a tickle in the throat and a nonproductive cough and some mild sinus drainage.   She has weaned herself off of citalopram.   Her submental lymph node has resolved spontaneously    Past Medical History  Diagnosis Date  . Gastric ulcer 2012    NSAID induced   . History of cardiac catheterization 2012    normal   . Gastric ulcer due to Helicobacter pylori 3009    and NSAID use  . Irritable bowel     with alternating constipation/bloating and  diarrhea    History reviewed. No pertinent past surgical history.     The following portions of the patient's history were reviewed and updated as appropriate: Allergies, current medications, and problem list.    Review of Systems:   Patient denies headache, fevers, malaise, unintentional weight loss, skin rash, eye pain, sinus congestion and sinus pain, sore throat, dysphagia,  hemoptysis , cough, dyspnea, wheezing, chest pain, palpitations, orthopnea, edema, abdominal pain, nausea, melena, diarrhea, constipation, flank pain, dysuria, hematuria, urinary  Frequency, nocturia, numbness, tingling, seizures,  Focal weakness, Loss of consciousness,  Tremor, insomnia, depression, anxiety, and suicidal ideation.     History   Social History  . Marital Status: Single    Spouse Name: N/A    Number of Children: N/A  . Years of Education: N/A   Occupational History  . nurses aide     Hospice   Social History Main Topics  . Smoking status: Never Smoker   . Smokeless tobacco: Never Used  . Alcohol Use: Yes  . Drug Use: No  . Sexual Activity: Not on file   Other Topics Concern  . Not on file   Social History Narrative  . No narrative on file    Objective:  Filed Vitals:   05/14/14 1600  BP: 148/78  Pulse: 98  Temp: 97.6 F (36.4 C)  Resp: 20     General appearance: alert,  cooperative and appears stated age Ears: normal TM's and external ear canals both ears Throat: lips, mucosa, and tongue normal; teeth and gums normal Neck: no adenopathy, no carotid bruit, supple, symmetrical, trachea midline and thyroid not enlarged, symmetric, no tenderness/mass/nodules Back: symmetric, no curvature. ROM normal. No CVA tenderness. Lungs: clear to auscultation bilaterally Heart: regular rate and rhythm, S1, S2 normal, no murmur, click, rub or gallop Abdomen: soft, non-tender; bowel sounds normal; no masses,  no organomegaly Pulses: 2+ and symmetric Skin: Skin color, texture,  turgor normal. No rashes or lesions Lymph nodes: Cervical, supraclavicular, and axillary nodes normal.  Assessment and Plan:  Enlarged submental lymph node Reactive, by repeat exam with resolution.  Transaminitis Her current symptoms of malaise with recent history of gastroenteritis suggest a viral hepatitis.  Will screen for Hep A, B and C , tick borne illnesses and autoimmune hepatitis.   Updated Medication List Outpatient Encounter Prescriptions as of 05/14/2014  Medication Sig  . ALPRAZolam (XANAX) 0.5 MG tablet TAKE 1 TABLET BY MOUHT ONCE A DAY AT BEDTIME AS NEEDED FOR SLEEP  . b complex vitamins tablet Take 1 tablet by mouth daily.  [name] guaiFENesin-codeine 100-10 MG/5ML syrup Take 5-10 mLs by mouth at bedtime as needed for cough.  . rizatriptan (MAXALT-MLT) 10 MG disintegrating tablet Take 10 mg by mouth as needed for migraine. May repeat in 2 hours if needed  . [DISCONTINUED] ALPRAZolam (XANAX) 0.5 MG tablet Take 1 tablet (0.5 mg total) by mouth at bedtime as needed for sleep.  [name] albuterol (PROVENTIL HFA;VENTOLIN HFA) 108 (90 BASE) MCG/ACT inhaler Inhale 2 puffs into the lungs every 6 (six) hours as needed for wheezing or shortness of breath.  [name] amoxicillin-clavulanate (AUGMENTIN) 875-125 MG per tablet Take 1 tablet by mouth 2 (two) times daily.  . citalopram (CELEXA) 40 MG tablet Take 1 tablet (40 mg total) by mouth daily.  [name] glucosamine-chondroitin 500-400 MG tablet Take 1 tablet by mouth 2 (two) times daily.  . pantoprazole (PROTONIX) 40 MG tablet Take 1 tablet (40 mg total) by mouth daily.  . promethazine (PHENERGAN) 25 MG tablet Take 1 tablet (25 mg total) by mouth every 8 (eight) hours as needed for nausea.  . [DISCONTINUED] guaiFENesin-codeine 100-10 MG/5ML syrup Take 5-10 mLs by mouth at bedtime as needed for cough.     Orders Placed This Encounter  Procedures  . Comp Met (CMET)  . Magnesium  . T4 AND TSH  . CBC with Differential  . Hepatic function panel  . ANA  .  Anti-Feeley antibody  . Hepatitis B core antibody, total  . Hepatitis B surface antigen  . Hepatitis C antibody  . Hepatitis B surface antibody  . Hepatitis A antibody, IgM  . Rocky mtn spotted fvr abs pnl(IgG+IgM)  . Lyme Juliette Alcide. Blt. IgG & IgM w/bands  . Ehrlichia Antibody Panel  . EKG 12-Lead    No Follow-up on file.

## 2014-05-14 NOTE — Telephone Encounter (Signed)
Please advise 

## 2014-05-14 NOTE — Telephone Encounter (Signed)
Scheduled patient for 3.45

## 2014-05-14 NOTE — Patient Instructions (Addendum)
Your persistent cough may be from PND (post nasal drip ) or from  Reflux.  I am treating you for both  I recommend a trial of generic benadryl 25 mg in the eening near bedtime for the PND  continue daytime zyrtec for allergic rhinitis   And protonix in the AM 20 minutes prior to eating   I have refilled the cough medication   You may want to resume citalopram at a lower dose if you don't feel better on black cohosh

## 2014-05-15 ENCOUNTER — Encounter: Payer: Self-pay | Admitting: Internal Medicine

## 2014-05-15 DIAGNOSIS — R7401 Elevation of levels of liver transaminase levels: Secondary | ICD-10-CM | POA: Insufficient documentation

## 2014-05-15 DIAGNOSIS — IMO0002 Reserved for concepts with insufficient information to code with codable children: Secondary | ICD-10-CM | POA: Insufficient documentation

## 2014-05-15 DIAGNOSIS — R740 Nonspecific elevation of levels of transaminase and lactic acid dehydrogenase [LDH]: Secondary | ICD-10-CM

## 2014-05-15 LAB — CBC WITH DIFFERENTIAL/PLATELET
BASOS PCT: 0.3 % (ref 0.0–3.0)
Basophils Absolute: 0 10*3/uL (ref 0.0–0.1)
EOS PCT: 1.8 % (ref 0.0–5.0)
Eosinophils Absolute: 0.1 10*3/uL (ref 0.0–0.7)
HEMATOCRIT: 39.4 % (ref 36.0–46.0)
Hemoglobin: 13.2 g/dL (ref 12.0–15.0)
Lymphocytes Relative: 32.2 % (ref 12.0–46.0)
Lymphs Abs: 1.7 10*3/uL (ref 0.7–4.0)
MCHC: 33.4 g/dL (ref 30.0–36.0)
MCV: 84.6 fl (ref 78.0–100.0)
MONO ABS: 0.3 10*3/uL (ref 0.1–1.0)
Monocytes Relative: 5.6 % (ref 3.0–12.0)
Neutro Abs: 3.2 10*3/uL (ref 1.4–7.7)
Neutrophils Relative %: 60.1 % (ref 43.0–77.0)
Platelets: 319 10*3/uL (ref 150.0–400.0)
RBC: 4.66 Mil/uL (ref 3.87–5.11)
RDW: 13.9 % (ref 11.5–15.5)
WBC: 5.4 10*3/uL (ref 4.0–10.5)

## 2014-05-15 LAB — COMPREHENSIVE METABOLIC PANEL
ALBUMIN: 4.3 g/dL (ref 3.5–5.2)
ALK PHOS: 84 U/L (ref 39–117)
ALT: 86 U/L — AB (ref 0–35)
AST: 57 U/L — ABNORMAL HIGH (ref 0–37)
BUN: 11 mg/dL (ref 6–23)
CO2: 26 mEq/L (ref 19–32)
Calcium: 9.4 mg/dL (ref 8.4–10.5)
Chloride: 107 mEq/L (ref 96–112)
Creatinine, Ser: 0.8 mg/dL (ref 0.4–1.2)
GFR: 74.76 mL/min (ref >60.00)
Glucose, Bld: 119 mg/dL — ABNORMAL HIGH (ref 70–99)
POTASSIUM: 4 meq/L (ref 3.5–5.1)
SODIUM: 141 meq/L (ref 135–145)
Total Bilirubin: 0.1 mg/dL — ABNORMAL LOW (ref 0.2–1.2)
Total Protein: 7.5 g/dL (ref 6.0–8.3)

## 2014-05-15 LAB — MAGNESIUM: Magnesium: 2.2 mg/dL (ref 1.5–2.5)

## 2014-05-15 LAB — T4 AND TSH
T4, Total: 7.3 ug/dL (ref 4.5–12.0)
TSH: 2.58 u[IU]/mL (ref 0.450–4.500)

## 2014-05-15 NOTE — Telephone Encounter (Signed)
Last OV 5.15.15, last refill 2.15.15.  Please advise refill.

## 2014-05-15 NOTE — Telephone Encounter (Signed)
Ok to refill,  printed rx  

## 2014-05-15 NOTE — Assessment & Plan Note (Signed)
Reactive, by repeat exam with resolution.

## 2014-05-15 NOTE — Telephone Encounter (Signed)
Rx faxed to [phone].

## 2014-05-15 NOTE — Assessment & Plan Note (Addendum)
Her current symptoms of malaise with recent history of gastroenteritis suggest a viral hepatitis.  Will screen for Hep A, B and C , tick borne illnesses and autoimmune hepatitis.

## 2014-05-22 NOTE — Telephone Encounter (Signed)
Pt notified of unread message. Lab appt scheduled 05/24/14

## 2014-05-24 ENCOUNTER — Other Ambulatory Visit (INDEPENDENT_AMBULATORY_CARE_PROVIDER_SITE_OTHER): Payer: PRIVATE HEALTH INSURANCE

## 2014-05-24 DIAGNOSIS — R7402 Elevation of levels of lactic acid dehydrogenase (LDH): Secondary | ICD-10-CM

## 2014-05-24 DIAGNOSIS — R7401 Elevation of levels of liver transaminase levels: Secondary | ICD-10-CM

## 2014-05-24 DIAGNOSIS — R740 Nonspecific elevation of levels of transaminase and lactic acid dehydrogenase [LDH]: Principal | ICD-10-CM

## 2014-05-24 LAB — HEPATIC FUNCTION PANEL
ALBUMIN: 4.2 g/dL (ref 3.5–5.2)
ALK PHOS: 85 U/L (ref 39–117)
ALT: 34 U/L (ref 0–35)
AST: 28 U/L (ref 0–37)
Bilirubin, Direct: 0 mg/dL (ref 0.0–0.3)
TOTAL PROTEIN: 8 g/dL (ref 6.0–8.3)
Total Bilirubin: 0.3 mg/dL (ref 0.2–1.2)

## 2014-05-25 LAB — HEPATITIS C ANTIBODY: HCV Ab: NEGATIVE

## 2014-05-25 LAB — HEPATITIS A ANTIBODY, IGM: HEP A IGM: NONREACTIVE

## 2014-05-25 LAB — HEPATITIS B SURFACE ANTIBODY,QUALITATIVE: Hep B S Ab: POSITIVE — AB

## 2014-05-25 LAB — HEPATITIS B SURFACE ANTIGEN: Hepatitis B Surface Ag: NEGATIVE

## 2014-05-25 LAB — HEPATITIS B CORE ANTIBODY, TOTAL: Hep B Core Total Ab: NONREACTIVE

## 2014-05-27 LAB — LYME ABY, WSTRN BLT IGG & IGM W/BANDS
B BURGDORFERI IGG ABS (IB): NEGATIVE
B BURGDORFERI IGM ABS (IB): NEGATIVE
LYME DISEASE 18 KD IGG: NONREACTIVE
LYME DISEASE 23 KD IGM: NONREACTIVE
LYME DISEASE 58 KD IGG: NONREACTIVE
LYME DISEASE 66 KD IGG: NONREACTIVE
LYME DISEASE 93 KD IGG: NONREACTIVE
Lyme Disease 23 kD IgG: NONREACTIVE
Lyme Disease 28 kD IgG: NONREACTIVE
Lyme Disease 30 kD IgG: NONREACTIVE
Lyme Disease 39 kD IgG: NONREACTIVE
Lyme Disease 39 kD IgM: NONREACTIVE
Lyme Disease 41 kD IgG: NONREACTIVE
Lyme Disease 41 kD IgM: NONREACTIVE
Lyme Disease 45 kD IgG: NONREACTIVE

## 2014-05-27 LAB — ROCKY MTN SPOTTED FVR ABS PNL(IGG+IGM)
RMSF IGG: 0.22 IV
RMSF IgM: 0.26 IV

## 2014-05-27 LAB — ANTI-SMITH ANTIBODY: ENA SM Ab Ser-aCnc: <1

## 2014-05-27 LAB — ANA: Anti Nuclear Antibody(ANA): NEGATIVE

## 2014-05-28 ENCOUNTER — Encounter: Payer: Self-pay | Admitting: Internal Medicine

## 2014-05-28 LAB — EHRLICHIA ANTIBODY PANEL: E chaffeensis (HGE) Ab, IgM: <1:20 {titer}

## 2014-05-30 NOTE — Telephone Encounter (Signed)
Mailed unread message to pt  

## 2014-06-18 ENCOUNTER — Ambulatory Visit: Payer: PRIVATE HEALTH INSURANCE | Admitting: Adult Health

## 2014-07-04 ENCOUNTER — Other Ambulatory Visit: Payer: Self-pay | Admitting: Internal Medicine

## 2014-07-15 ENCOUNTER — Other Ambulatory Visit: Payer: Self-pay | Admitting: Internal Medicine

## 2014-07-15 NOTE — Telephone Encounter (Signed)
Last visit 05/14/14

## 2014-07-17 NOTE — Telephone Encounter (Signed)
Ok to refill,  printed rx  

## 2014-07-17 NOTE — Telephone Encounter (Signed)
Refill faxed

## 2014-08-06 ENCOUNTER — Ambulatory Visit: Payer: PRIVATE HEALTH INSURANCE | Admitting: Internal Medicine

## 2014-08-07 ENCOUNTER — Ambulatory Visit: Payer: PRIVATE HEALTH INSURANCE | Admitting: Internal Medicine

## 2014-09-05 ENCOUNTER — Other Ambulatory Visit: Payer: Self-pay | Admitting: Internal Medicine

## 2014-09-17 ENCOUNTER — Telehealth: Payer: Self-pay | Admitting: Internal Medicine

## 2014-09-17 MED ORDER — CLONAZEPAM 1 MG PO TABS: 1.0000 mg | ORAL_TABLET | Freq: Every day | ORAL | Status: DC

## 2014-09-17 NOTE — Telephone Encounter (Signed)
Last filled 08/03/13 and was removed patient medication list 5/15 please advise

## 2014-09-17 NOTE — Telephone Encounter (Signed)
Placed in quick sign. 

## 2014-09-17 NOTE — Telephone Encounter (Signed)
Script faxed as requested.

## 2014-09-17 NOTE — Telephone Encounter (Signed)
Ok to refill,  printed rx  

## 2014-09-17 NOTE — Telephone Encounter (Signed)
Patient would like to have her Conazepam prescription refilled to help her sleep.

## 2014-10-02 ENCOUNTER — Other Ambulatory Visit: Payer: Self-pay | Admitting: Internal Medicine

## 2014-10-02 NOTE — Telephone Encounter (Signed)
Ok to refill 

## 2014-10-02 NOTE — Telephone Encounter (Signed)
Pt last seen by Raquel 6.16.15, 3 cancellations since then.  Last refill 9.5.14.  Please advise refill.

## 2014-10-08 ENCOUNTER — Encounter: Payer: Self-pay | Admitting: Internal Medicine

## 2014-10-08 ENCOUNTER — Encounter: Payer: Self-pay | Admitting: *Deleted

## 2014-10-08 ENCOUNTER — Ambulatory Visit (INDEPENDENT_AMBULATORY_CARE_PROVIDER_SITE_OTHER): Payer: PRIVATE HEALTH INSURANCE | Admitting: Internal Medicine

## 2014-10-08 VITALS — BP 118/68 | HR 79 | Temp 97.8°F | Resp 16 | Ht 64.0 in | Wt 180.5 lb

## 2014-10-08 DIAGNOSIS — R519 Headache, unspecified: Secondary | ICD-10-CM

## 2014-10-08 DIAGNOSIS — F32A Depression, unspecified: Secondary | ICD-10-CM

## 2014-10-08 DIAGNOSIS — G44019 Episodic cluster headache, not intractable: Secondary | ICD-10-CM

## 2014-10-08 DIAGNOSIS — F329 Major depressive disorder, single episode, unspecified: Secondary | ICD-10-CM

## 2014-10-08 DIAGNOSIS — F419 Anxiety disorder, unspecified: Secondary | ICD-10-CM

## 2014-10-08 DIAGNOSIS — R51 Headache: Secondary | ICD-10-CM

## 2014-10-08 DIAGNOSIS — F418 Other specified anxiety disorders: Secondary | ICD-10-CM

## 2014-10-08 LAB — CBC WITH DIFFERENTIAL/PLATELET
Basophils Absolute: 0 10*3/uL (ref 0.0–0.1)
Basophils Relative: 0.6 % (ref 0.0–3.0)
Eosinophils Absolute: 0.1 10*3/uL (ref 0.0–0.7)
Eosinophils Relative: 1.7 % (ref 0.0–5.0)
HEMATOCRIT: 39.7 % (ref 36.0–46.0)
HEMOGLOBIN: 12.8 g/dL (ref 12.0–15.0)
LYMPHS ABS: 1.8 10*3/uL (ref 0.7–4.0)
Lymphocytes Relative: 27.7 % (ref 12.0–46.0)
MCHC: 32.3 g/dL (ref 30.0–36.0)
MCV: 84.6 fl (ref 78.0–100.0)
Monocytes Absolute: 0.4 10*3/uL (ref 0.1–1.0)
Monocytes Relative: 6.5 % (ref 3.0–12.0)
Neutro Abs: 4 10*3/uL (ref 1.4–7.7)
Neutrophils Relative %: 63.5 % (ref 43.0–77.0)
PLATELETS: 310 10*3/uL (ref 150.0–400.0)
RBC: 4.7 Mil/uL (ref 3.87–5.11)
RDW: 13.9 % (ref 11.5–15.5)
WBC: 6.3 10*3/uL (ref 4.0–10.5)

## 2014-10-08 LAB — C-REACTIVE PROTEIN: CRP: 0.9 mg/dL (ref 0.5–20.0)

## 2014-10-08 LAB — SEDIMENTATION RATE: Sed Rate: 20 mm/hr (ref 0–22)

## 2014-10-08 MED ORDER — TOPIRAMATE 25 MG PO CPSP: 25.0000 mg | ORAL_CAPSULE | Freq: Two times a day (BID) | ORAL | Status: DC

## 2014-10-08 NOTE — Progress Notes (Signed)
Patient ID: Nichole Riddle, female   DOB: 09/30/1961, 53 y.o.   MRN: [id number]   Patient Active Problem List   Diagnosis Date Noted  . Cluster headache, episodic 10/09/2014  . Transaminitis 05/15/2014  . Enlarged submental lymph node 03/12/2014  . Statin intolerance 12/02/2013  . Encounter for preventive health examination 08/05/2013  . Polyarthralgia 03/11/2013  . Anxiety and depression 10/27/2012  . Irritable bowel   . Hyperlipidemia LDL goal < 130 02/20/2012  . Menopause 10/02/2011  . Obesity 10/02/2011  . Headache, migraine 09/29/2011  . History of colonoscopy 09/29/2011  . History of gastric ulcer   . History of cardiac catheterization     Subjective:  CC:   Chief Complaint  Patient presents with  . Headache    Sharpe pain in left side of head almost fainted while out shopping. Has been DX with cluster headaches.Patient friend noticed a vein at temple area was large and throbbing at the time.    HPI:   Nichole Riddle is a 53 y.o. female who presents for Recent episode of severe headache of Sudden onset of severe left temple  pain , so severe it felt like a cigarette was burning her temple.  Accompanied by nausea and bulging tender vein at left temple.  The headache is recurrent  And is accompanied by drooping of the left eyelid and drawing up of the left side of her dace.   Improved with maxalt and clonazepam   History of cluster headaches diagnosed by Dr Erling Cruz several years ago with normal MRI, after misdiagnosis of pseudotumor cerebri by [facility] neurologist.  Dr. Erling Cruz treated her with topirimate and pr maxalt     Past Medical History  Diagnosis Date  . Gastric ulcer 2012    NSAID induced   . History of cardiac catheterization 2012    normal   . Gastric ulcer due to Helicobacter pylori 6789    and NSAID use  . Irritable bowel     with alternating constipation/bloating and diarrhea    No past surgical history on file.     The following portions of the  patient's history were reviewed and updated as appropriate: Allergies, current medications, and problem list.    Review of Systems:   Patient denies headache, fevers, malaise, unintentional weight loss, skin rash, eye pain, sinus congestion and sinus pain, sore throat, dysphagia,  hemoptysis , cough, dyspnea, wheezing, chest pain, palpitations, orthopnea, edema, abdominal pain, nausea, melena, diarrhea, constipation, flank pain, dysuria, hematuria, urinary  Frequency, nocturia, numbness, tingling, seizures,  Focal weakness, Loss of consciousness,  Tremor, insomnia, depression, anxiety, and suicidal ideation.     History   Social History  . Marital Status: Single    Spouse Name: N/A    Number of Children: N/A  . Years of Education: N/A   Occupational History  . nurses aide     Hospice   Social History Main Topics  . Smoking status: Never Smoker   . Smokeless tobacco: Never Used  . Alcohol Use: Yes  . Drug Use: No  . Sexual Activity: Not on file   Other Topics Concern  . Not on file   Social History Narrative    Objective:  Filed Vitals:   10/08/14 1321  BP: 118/68  Pulse: 79  Temp: 97.8 F (36.6 C)  Resp: 16     General appearance: alert, cooperative and appears stated age Ears: normal TM's and external ear canals both ears Throat: lips, mucosa, and tongue normal; teeth  and gums normal Neck: no adenopathy, no carotid bruit, supple, symmetrical, trachea midline and thyroid not enlarged, symmetric, no tenderness/mass/nodules Back: symmetric, no curvature. ROM normal. No CVA tenderness. Lungs: clear to auscultation bilaterally Heart: regular rate and rhythm, S1, S2 normal, no murmur, click, rub or gallop Abdomen: soft, non-tender; bowel sounds normal; no masses,  no organomegaly Pulses: 2+ and symmetric Skin: Skin color, texture, turgor normal. No rashes or lesions Lymph nodes: Cervical, supraclavicular, and axillary nodes normal. Neuro: CNs 2-12 intact. DTRs 2+/4  in biceps, brachioradialis, patellars and achilles. Muscle strength 5/5 in upper and lower exremities. Fine resting tremor bilaterally both hands cerebellar function normal. Romberg negative.  No pronator drift.   Gait normal.   Assessment and Plan:  Cluster headache, episodic Recurrent, with normal CBC. ESR, and CRP (done to rule out temporal arteritis).  Resuming topiramate and maxalt.   Anxiety and depression Currently managed with citalopram and prn clonazepam.  Reducing dose of citalopram while starting topiramate.   Updated Medication List Outpatient Encounter Prescriptions as of 10/08/2014  Medication Sig  . ALPRAZolam (XANAX) 0.5 MG tablet TAKE 1 TABLET BY MOUTH AT BEDTIME AS NEEDED FOR SLEEP  . clonazePAM (KLONOPIN) 1 MG tablet Take 1 tablet (1 mg total) by mouth at bedtime.  . pantoprazole (PROTONIX) 40 MG tablet Take 1 tablet (40 mg total) by mouth daily.  . promethazine (PHENERGAN) 25 MG tablet Take 1 tablet (25 mg total) by mouth every 8 (eight) hours as needed for nausea.  . rizatriptan (MAXALT-MLT) 10 MG disintegrating tablet Take 10 mg by mouth as needed for migraine. May repeat in 2 hours if needed  . rizatriptan (MAXALT-MLT) 10 MG disintegrating tablet DISSOLVE 1 TABLET ON THE TONGUE AS NEEDED FOR MIGRAINE, MAY REPEAT IN 2 HOURS IF NEEDED  . albuterol (PROVENTIL HFA;VENTOLIN HFA) 108 (90 BASE) MCG/ACT inhaler Inhale 2 puffs into the lungs every 6 (six) hours as needed for wheezing or shortness of breath.  [name] amoxicillin-clavulanate (AUGMENTIN) 875-125 MG per tablet Take 1 tablet by mouth 2 (two) times daily.  [name] b complex vitamins tablet Take 1 tablet by mouth daily.  . citalopram (CELEXA) 40 MG tablet Take 1 tablet (40 mg total) by mouth daily.  [name] glucosamine-chondroitin 500-400 MG tablet Take 1 tablet by mouth 2 (two) times daily.  [name] guaiFENesin-codeine 100-10 MG/5ML syrup Take 5-10 mLs by mouth at bedtime as needed for cough.  . topiramate (TOPAMAX) 25 MG capsule Take 1  capsule (25 mg total) by mouth 2 (two) times daily. Increase to 2 tablets daily after 2 weeks     Orders Placed This Encounter  Procedures  . Sedimentation rate  . C-reactive protein  . CBC with Differential    No Follow-up on file.

## 2014-10-08 NOTE — Patient Instructions (Signed)
Reduce citalopram  to 1/2 tablet daily starting tomorrow for two weeks,  Then  Reduce dose to 1/2 tablet every other day  .   Start with 1 tablet of topamax at bedtime for two weeks,  Then increase  to 2 tablets at bedtime   Call or e mail at 3 weeks to let me know how you are feeling so I can decide whether to adjust your medications any further

## 2014-10-08 NOTE — Progress Notes (Signed)
Pre-visit discussion using our clinic review tool. No additional management support is needed unless otherwise documented below in the visit note.  

## 2014-10-09 DIAGNOSIS — G44019 Episodic cluster headache, not intractable: Secondary | ICD-10-CM | POA: Insufficient documentation

## 2014-10-09 NOTE — Assessment & Plan Note (Signed)
Recurrent, with normal CBC. ESR, and CRP (done to rule out temporal arteritis).  Resuming topiramate and maxalt.

## 2014-10-09 NOTE — Assessment & Plan Note (Addendum)
Currently managed with citalopram and prn clonazepam.  Reducing dose of citalopram while starting topiramate.

## 2014-10-11 ENCOUNTER — Encounter: Payer: Self-pay | Admitting: *Deleted

## 2014-10-15 ENCOUNTER — Telehealth: Payer: Self-pay | Admitting: Internal Medicine

## 2014-10-15 ENCOUNTER — Encounter: Payer: Self-pay | Admitting: Internal Medicine

## 2014-10-15 MED ORDER — PREDNISONE (PAK) 10 MG PO TABS: ORAL_TABLET | ORAL | Status: DC

## 2014-10-15 MED ORDER — MECLIZINE HCL 50 MG PO TABS: 50.0000 mg | ORAL_TABLET | Freq: Three times a day (TID) | ORAL | Status: DC | PRN

## 2014-10-15 NOTE — Telephone Encounter (Signed)
Prednisone taper sent in,  She should also try taking sudafed PE 10 mg every6 hours for eustachian tube congestion ok to refill meclizine   Since rx has probably expired .  i will do

## 2014-10-15 NOTE — Telephone Encounter (Signed)
Patient Information:  Caller Name: Adonica  Phone: [phone]  Patient: Nichole Riddle, Nichole Riddle  Gender: Female  DOB: Apr 17, 1961  Age: 53 Years  PCP: Deborra Medina (Adults only)  Pregnant: No  Office Follow Up:  Does the office need to follow up with this patient?: Yes  Instructions For The Office: Please call pt and let her know.  RN Note:  Pt states she is too dizzy to get in a car and be moved today. As soon as she stands she is intensley dizzy. She is asking if the MD would call her in something and she could come when she stabilizes.  Symptoms  Reason For Call & Symptoms: Pt is calling with "spinning". Onset Sunday. Pt has been treated for vertigo in the past but that was a long time ago. Pt started on Meclazine  25mg  1 q 6h (which was from 2010) which helped Monday but it worsened yesterday. Her left ear has had multiple sharp pains also and she feels nauseated. Mom states her Mother has the same symptoms/hers are few and far between. She turned a certain way in the house and that started the symptoms.  Reviewed Health History In EMR: Yes  Reviewed Medications In EMR: Yes  Reviewed Allergies In EMR: Yes  Reviewed Surgeries / Procedures: Yes  Date of Onset of Symptoms: 10/13/2014 OB / GYN:  LMP: Unknown  Guideline(s) Used:  Dizziness  Disposition Per Guideline:   Go to Office Now  Reason For Disposition Reached:   Lightheadedness (dizziness) present now, after 2 hours of rest and fluids  Advice Given:  N/A  Patient Refused Recommendation:  Patient Requests Prescription  Pt is requesting MD call in something to [facility].

## 2014-10-15 NOTE — Telephone Encounter (Signed)
Please advise 

## 2014-10-15 NOTE — Telephone Encounter (Signed)
Left patient detailed message per DPR  

## 2014-10-17 ENCOUNTER — Telehealth: Payer: Self-pay | Admitting: Internal Medicine

## 2014-10-17 DIAGNOSIS — G44019 Episodic cluster headache, not intractable: Secondary | ICD-10-CM

## 2014-10-17 NOTE — Telephone Encounter (Signed)
Referral is in process as requested 

## 2014-10-17 NOTE — Telephone Encounter (Signed)
Patient stated she needed a referral to Rheumatology that this was suppose to be from the appointment she had on 10/08/14, please advise.

## 2014-10-18 NOTE — Telephone Encounter (Signed)
Pt aware via mychart 

## 2014-10-28 ENCOUNTER — Encounter: Payer: Self-pay | Admitting: Internal Medicine

## 2014-11-02 ENCOUNTER — Other Ambulatory Visit: Payer: Self-pay | Admitting: Internal Medicine

## 2014-11-08 ENCOUNTER — Ambulatory Visit (INDEPENDENT_AMBULATORY_CARE_PROVIDER_SITE_OTHER): Payer: PRIVATE HEALTH INSURANCE | Admitting: Internal Medicine

## 2014-11-08 ENCOUNTER — Other Ambulatory Visit (HOSPITAL_COMMUNITY)
Admission: RE | Admit: 2014-11-08 | Discharge: 2014-11-08 | Disposition: A | Discharge status: Home / Self Care | Payer: PRIVATE HEALTH INSURANCE | Source: Ambulatory Visit | Attending: Internal Medicine | Admitting: Internal Medicine

## 2014-11-08 ENCOUNTER — Encounter: Payer: Self-pay | Admitting: Internal Medicine

## 2014-11-08 VITALS — BP 112/70 | HR 87 | Temp 97.9°F | Ht 64.0 in | Wt 180.0 lb

## 2014-11-08 DIAGNOSIS — Z01419 Encounter for gynecological examination (general) (routine) without abnormal findings: Secondary | ICD-10-CM | POA: Insufficient documentation

## 2014-11-08 DIAGNOSIS — R319 Hematuria, unspecified: Secondary | ICD-10-CM

## 2014-11-08 DIAGNOSIS — Z1151 Encounter for screening for human papillomavirus (HPV): Secondary | ICD-10-CM | POA: Insufficient documentation

## 2014-11-08 DIAGNOSIS — R300 Dysuria: Secondary | ICD-10-CM

## 2014-11-08 DIAGNOSIS — R233 Spontaneous ecchymoses: Secondary | ICD-10-CM | POA: Insufficient documentation

## 2014-11-08 DIAGNOSIS — N950 Postmenopausal bleeding: Secondary | ICD-10-CM

## 2014-11-08 LAB — HEPATIC FUNCTION PANEL
ALK PHOS: 84 U/L (ref 39–117)
ALT: 18 U/L (ref 0–35)
AST: 18 U/L (ref 0–37)
Albumin: 4 g/dL (ref 3.5–5.2)
BILIRUBIN DIRECT: 0 mg/dL (ref 0.0–0.3)
BILIRUBIN TOTAL: 0.4 mg/dL (ref 0.2–1.2)
TOTAL PROTEIN: 6.8 g/dL (ref 6.0–8.3)

## 2014-11-08 LAB — CBC WITH DIFFERENTIAL/PLATELET
BASOS PCT: 0.4 % (ref 0.0–3.0)
Basophils Absolute: 0 10*3/uL (ref 0.0–0.1)
EOS PCT: 1.2 % (ref 0.0–5.0)
Eosinophils Absolute: 0.1 10*3/uL (ref 0.0–0.7)
HCT: 38.4 % (ref 36.0–46.0)
Hemoglobin: 12.5 g/dL (ref 12.0–15.0)
LYMPHS PCT: 28.6 % (ref 12.0–46.0)
Lymphs Abs: 1.8 10*3/uL (ref 0.7–4.0)
MCHC: 32.6 g/dL (ref 30.0–36.0)
MCV: 83.3 fl (ref 78.0–100.0)
Monocytes Absolute: 0.3 10*3/uL (ref 0.1–1.0)
Monocytes Relative: 5.2 % (ref 3.0–12.0)
NEUTROS PCT: 64.6 % (ref 43.0–77.0)
Neutro Abs: 4.1 10*3/uL (ref 1.4–7.7)
PLATELETS: 317 10*3/uL (ref 150.0–400.0)
RBC: 4.61 Mil/uL (ref 3.87–5.11)
RDW: 14.1 % (ref 11.5–15.5)
WBC: 6.3 10*3/uL (ref 4.0–10.5)

## 2014-11-08 LAB — URINALYSIS, ROUTINE W REFLEX MICROSCOPIC
Bilirubin Urine: NEGATIVE
Hgb urine dipstick: NEGATIVE
Ketones, ur: NEGATIVE
NITRITE: NEGATIVE
Specific Gravity, Urine: 1.015 (ref 1.000–1.030)
Total Protein, Urine: NEGATIVE
Urine Glucose: NEGATIVE
Urobilinogen, UA: 0.2 (ref 0.0–1.0)
pH: 6.5 (ref 5.0–8.0)

## 2014-11-08 LAB — POCT URINALYSIS DIPSTICK
BILIRUBIN UA: NEGATIVE
Blood, UA: NEGATIVE
GLUCOSE UA: NEGATIVE
Ketones, UA: NEGATIVE
Nitrite, UA: NEGATIVE
Protein, UA: NEGATIVE
Spec Grav, UA: 1.015
Urobilinogen, UA: 0.2
pH, UA: 6.5

## 2014-11-08 LAB — PROTIME-INR
INR: 1 ratio (ref 0.8–1.0)
PROTHROMBIN TIME: 11.3 s (ref 9.6–13.1)

## 2014-11-08 LAB — APTT: APTT: 29.2 s (ref 23.4–32.7)

## 2014-11-08 NOTE — Progress Notes (Signed)
Pre visit review using our clinic review tool, if applicable. No additional management support is needed unless otherwise documented below in the visit note. 

## 2014-11-08 NOTE — Progress Notes (Signed)
Patient ID: Nichole Riddle, female   DOB: 02/23/1961, 53 y.o.   MRN: [id number]   Patient Active Problem List   Diagnosis Date Noted  . Postmenopausal vaginal bleeding 11/10/2014  . Petechial rash 11/08/2014  . Cluster headache, episodic 10/09/2014  . Transaminitis 05/15/2014  . Enlarged submental lymph node 03/12/2014  . Statin intolerance 12/02/2013  . Encounter for preventive health examination 08/05/2013  . Polyarthralgia 03/11/2013  . Anxiety and depression 10/27/2012  . Irritable bowel   . Hyperlipidemia LDL goal < 130 02/20/2012  . Menopause 10/02/2011  . Obesity 10/02/2011  . Headache, migraine 09/29/2011  . History of colonoscopy 09/29/2011  . History of gastric ulcer   . History of cardiac catheterization     Subjective:  CC:   Chief Complaint  Patient presents with  . Hematuria    started yesterday, lasted just a couple hours. Denies frequency, a little dysuraia today "irritation"  . Blood In Stools    this morning, had severe back pain right after BM    HPI:   Nichole Riddle is a 53 y.o. female who presents for  Sudden onset of spontaneous vaginal bleeding,  Patient first noted pink tinged urine, so she inserted a finger in her vagina and noted fresh blood. The vaginal bleeding resolved over the next several hours.  .  The followig day had bright ref blood with large bowel movement.  She did not reexamine her vagina. She does not take any oTC analgesics containing aspirin,  No herbals,  Only topomax.Has not had intercourse in at least 2 week s.  NO gums bleeding , ecchymosis or epistaxis  She underwent a Supracervical hysterectomy years ago for menorrhagia secondary to fibroid uterus.       On exam several petechia noted on cervix.  Rectal exam no stool on valutl  Noticed petecial rash on thighs,  Patient had not noticed this before.  Denies sontansous nosebleeds and gum bleeds.   Past Medical History  Diagnosis Date  . Gastric ulcer 2012    NSAID induced    . History of cardiac catheterization 2012    normal   . Gastric ulcer due to Helicobacter pylori 3790    and NSAID use  . Irritable bowel     with alternating constipation/bloating and diarrhea    Past Surgical History  Procedure Laterality Date  . Abdominal hysterectomy         The following portions of the patient's history were reviewed and updated as appropriate: Allergies, current medications, and problem list.    Review of Systems:   Patient denies headache, fevers, malaise, unintentional weight loss, skin rash, eye pain, sinus congestion and sinus pain, sore throat, dysphagia,  hemoptysis , cough, dyspnea, wheezing, chest pain, palpitations, orthopnea, edema, abdominal pain, nausea, melena, diarrhea, constipation, flank pain, dysuria, hematuria, urinary  Frequency, nocturia, numbness, tingling, seizures,  Focal weakness, Loss of consciousness,  Tremor, insomnia, depression, anxiety, and suicidal ideation.     History   Social History  . Marital Status: Single    Spouse Name: N/A    Number of Children: N/A  . Years of Education: N/A   Occupational History  . nurses aide     Hospice   Social History Main Topics  . Smoking status: Never Smoker   . Smokeless tobacco: Never Used  . Alcohol Use: Yes  . Drug Use: No  . Sexual Activity: Not on file   Other Topics Concern  . Not on file   Social  History Narrative    Objective:  Filed Vitals:   11/08/14 1251  BP: 112/70  Pulse: 87  Temp: 97.9 F (36.6 C)     General Appearance:    Alert, cooperative, no distress, appears stated age  Head:    Normocephalic, without obvious abnormality, atraumatic  Eyes:    PERRL, conjunctiva/corneas clear, EOM's intact, fundi    benign, both eyes  Ears:    Normal TM's and external ear canals, both ears  Nose:   Nares normal, septum midline, mucosa normal, no drainage    or sinus tenderness  Throat:   Lips, mucosa, and tongue normal; teeth and gums normal  Neck:    Supple, symmetrical, trachea midline, no adenopathy;    thyroid:  no enlargement/tenderness/nodules; no carotid   bruit or JVD  Back:     Symmetric, no curvature, ROM normal, no CVA tenderness   Heart:    Regular rate and rhythm, S1 and S2 normal, no murmur, rub   or gallop  Breast Exam:    No tenderness, masses, or nipple abnormality  Abdomen:     Soft, non-tender, bowel sounds active all four quadrants,    no masses, no organomegaly  Genitalia:    Pelvic: cervix has a few scattered petechiae, otherwise  normal in appearance, external genitalia normal, no adnexal masses or tenderness, no cervical motion tenderness, rectovaginal septum normal, uterus surgically absent  Extremities:   Extremities normal, atraumatic, no cyanosis or edema  Skin:   Diffuse petechial rash covering both thighs           Assessment and Plan:  Petechial rash Noted on anterior and posterior thighs and cervix on exam today  PT, PTT and platelet count are normal. Etiology unclear; given her history of polyarthritis, etiology may be an as yet undiagnosed vasculitic syndrome.  However, she has had normal markers for inflammation and automminue syndromes.  Rheumatology referral is under way . Lab Results  Component Value Date   INR 1.0 11/08/2014  No results found for: PTT  Lab Results  Component Value Date   WBC 6.3 11/08/2014   HGB 12.5 11/08/2014   HCT 38.4 11/08/2014   MCV 83.3 11/08/2014   PLT 317.0 11/08/2014   Lab Results  Component Value Date   ESRSEDRATE 20 10/08/2014   Lab Results  Component Value Date   CRP 0.9 10/08/2014     Postmenopausal vaginal bleeding etiology unclear.  Patient is s/p supracervical hysterectomy.  Vaginal exam was normal except for a few petechiae on cervix.  PAP smear done. If bleeding recurs,  GYN referral will be made.  coags were normal.    Updated Medication List Outpatient Encounter Prescriptions as of 11/08/2014  Medication Sig  . clonazePAM (KLONOPIN) 1 MG  tablet Take 1 tablet (1 mg total) by mouth at bedtime.  . meclizine (ANTIVERT) 50 MG tablet Take 1 tablet (50 mg total) by mouth 3 (three) times daily as needed. For vertigo  . promethazine (PHENERGAN) 25 MG tablet Take 1 tablet (25 mg total) by mouth every 8 (eight) hours as needed for nausea.  . rizatriptan (MAXALT-MLT) 10 MG disintegrating tablet DISSOLVE 1 TABLET ON THE TONGUE AS NEEDED FOR MIGRAINE, MAY REPEAT IN 2 HOURS IF NEEDED  . topiramate (TOPAMAX) 25 MG capsule TAKE 1 CAPSULE BY MOUTH 2 (TWO) TIMES DAILY. INCREASE TO 2 CAPSULES ONCE A DAY AFTER 2 WEEKS  . rizatriptan (MAXALT-MLT) 10 MG disintegrating tablet Take 10 mg by mouth as needed for migraine. May repeat in  2 hours if needed  . [DISCONTINUED] albuterol (PROVENTIL HFA;VENTOLIN HFA) 108 (90 BASE) MCG/ACT inhaler Inhale 2 puffs into the lungs every 6 (six) hours as needed for wheezing or shortness of breath.  . [DISCONTINUED] ALPRAZolam (XANAX) 0.5 MG tablet TAKE 1 TABLET BY MOUTH AT BEDTIME AS NEEDED FOR SLEEP  . [DISCONTINUED] amoxicillin-clavulanate (AUGMENTIN) 875-125 MG per tablet Take 1 tablet by mouth 2 (two) times daily.  . [DISCONTINUED] b complex vitamins tablet Take 1 tablet by mouth daily.  . [DISCONTINUED] citalopram (CELEXA) 40 MG tablet Take 1 tablet (40 mg total) by mouth daily.  . [DISCONTINUED] glucosamine-chondroitin 500-400 MG tablet Take 1 tablet by mouth 2 (two) times daily.  . [DISCONTINUED] guaiFENesin-codeine 100-10 MG/5ML syrup Take 5-10 mLs by mouth at bedtime as needed for cough.  . [DISCONTINUED] pantoprazole (PROTONIX) 40 MG tablet Take 1 tablet (40 mg total) by mouth daily.  . [DISCONTINUED] predniSONE (STERAPRED UNI-PAK) 10 MG tablet 6 tablets on Day 1 , then reduce by 1 tablet daily until gone     Orders Placed This Encounter  Procedures  . Urine Culture  . Urinalysis, Routine w reflex microscopic  . Protime-INR  . APTT  . CBC with Differential  . Hepatic function panel  . POCT Urinalysis  Dipstick    No Follow-up on file.

## 2014-11-10 ENCOUNTER — Encounter: Payer: Self-pay | Admitting: Internal Medicine

## 2014-11-10 DIAGNOSIS — N950 Postmenopausal bleeding: Secondary | ICD-10-CM | POA: Insufficient documentation

## 2014-11-10 LAB — URINE CULTURE
Colony Count: NO GROWTH
ORGANISM ID, BACTERIA: NO GROWTH

## 2014-11-10 NOTE — Assessment & Plan Note (Signed)
etiology unclear.  Patient is s/p supracervical hysterectomy.  Vaginal exam was normal except for a few petechiae on cervix.  PAP smear done. If bleeding recurs,  GYN referral will be made.  coags were normal.

## 2014-11-10 NOTE — Assessment & Plan Note (Signed)
Noted on anterior and posterior thighs and cervix on exam today  PT, PTT and platelet count are normal. Etiology unclear; given her history of polyarthritis, etiology may be an as yet undiagnosed vasculitic syndrome.  Lab Results  Component Value Date   INR 1.0 11/08/2014  No results found for: PTT  Lab Results  Component Value Date   WBC 6.3 11/08/2014   HGB 12.5 11/08/2014   HCT 38.4 11/08/2014   MCV 83.3 11/08/2014   PLT 317.0 11/08/2014   Lab Results  Component Value Date   ESRSEDRATE 20 10/08/2014   Lab Results  Component Value Date   CRP 0.9 10/08/2014

## 2014-11-12 ENCOUNTER — Ambulatory Visit: Payer: PRIVATE HEALTH INSURANCE | Admitting: Nurse Practitioner

## 2014-11-13 ENCOUNTER — Encounter: Payer: Self-pay | Admitting: Internal Medicine

## 2014-11-13 LAB — CYTOLOGY - PAP

## 2014-11-18 ENCOUNTER — Other Ambulatory Visit: Payer: Self-pay | Admitting: Internal Medicine

## 2014-11-24 LAB — HM PAP SMEAR: HM Pap smear: NORMAL

## 2014-12-02 ENCOUNTER — Telehealth: Payer: Self-pay | Admitting: *Deleted

## 2014-12-02 MED ORDER — CITALOPRAM HYDROBROMIDE 20 MG PO TABS: 40.0000 mg | ORAL_TABLET | Freq: Every day | ORAL | Status: DC

## 2014-12-02 NOTE — Telephone Encounter (Signed)
Spoke with pt, advised of refill

## 2014-12-02 NOTE — Telephone Encounter (Signed)
Pt called states she is having irritability with Topamax; pt is requesting refill on Celexa.  Please advise refill or does pt have to be evaluated.  Please advise

## 2014-12-02 NOTE — Telephone Encounter (Signed)
Can resume celexa starting dose is 1/2 of a 20 mg tablet for first week,  Then 20 mg after one week.  Can increrase to 40 mg at next refill if needed. rx sent

## 2014-12-04 ENCOUNTER — Ambulatory Visit (INDEPENDENT_AMBULATORY_CARE_PROVIDER_SITE_OTHER)
Admission: RE | Admit: 2014-12-04 | Discharge: 2014-12-04 | Disposition: A | Discharge status: Home / Self Care | Payer: PRIVATE HEALTH INSURANCE | Source: Ambulatory Visit | Attending: Nurse Practitioner | Admitting: Nurse Practitioner

## 2014-12-04 ENCOUNTER — Ambulatory Visit (INDEPENDENT_AMBULATORY_CARE_PROVIDER_SITE_OTHER): Payer: PRIVATE HEALTH INSURANCE | Admitting: Nurse Practitioner

## 2014-12-04 ENCOUNTER — Encounter: Payer: Self-pay | Admitting: Nurse Practitioner

## 2014-12-04 VITALS — BP 120/62 | HR 84 | Temp 97.4°F | Resp 12 | Ht 64.0 in | Wt 177.1 lb

## 2014-12-04 DIAGNOSIS — M79644 Pain in right finger(s): Secondary | ICD-10-CM

## 2014-12-04 NOTE — Progress Notes (Signed)
Pre visit review using our clinic review tool, if applicable. No additional management support is needed unless otherwise documented below in the visit note. 

## 2014-12-04 NOTE — Patient Instructions (Addendum)
Please take tylenol for pain. Icing will also be helpful.   Your X-ray will be taken at [facility] today before 4:30. You walk in and tell the receptionist what you are there for and they will have the order.   We will follow up in 2 weeks. It may be sooner if something is found on X-ray.   Thumb Sprain Your exam shows you have a sprained thumb. This means the ligaments around the joint have been torn. Thumb sprains usually take 3-6 weeks to heal. However, severe, unstable sprains may need to be fixed surgically. Sometimes a small piece of bone is pulled off by the ligament. If this is not treated properly, a sprained thumb can lead to a painful, weak joint. Treatment helps reduce pain and shortens the period of disability. The thumb, and often the wrist, must remain splinted for the first 2-4 weeks to protect the joint. Keep your hand elevated and apply ice packs frequently to the injured area (20-30 minutes every 2-3 hours) for the next 2-4 days. This helps reduce swelling and control pain. Pain medicine may also be used for several days. Motion and strengthening exercises may later be prescribed for the joint to return to normal function. Be sure to see your doctor for follow-up because your thumb joint may require further support with splints, bandages or tape. Please see your doctor or go to the emergency room right away if you have increased pain despite proper treatment, or a numb, cold, or pale thumb. Document Released: 12/23/2004 Document Revised: 02/07/2012 Document Reviewed: 11/16/2008 [facility] Patient Information 2015 [place], [place]. This information is not intended to replace advice given to you by your health care provider. Make sure you discuss any questions you have with your health care provider.

## 2014-12-04 NOTE — Progress Notes (Signed)
Subjective:    Patient ID: Nichole Riddle, female    DOB: 1961-07-04, 54 y.o.   MRN: [id number]  HPI   Nichole Riddle is a 54 yo female with a CC of right side hand pain x 3 months.   1) Right hand pain x 3 months- No previous trauma. Pain varies in severity, hurt worse yesterday and was swollen. Grasping and pulling is painful. Described as a burning sensation. Tolerable today, radiating to wrist. "Burning/ searing". Worsening, Tight when fingers are extended. Anterior thumb into hand, posterior thumb into radial side of wrist and felt in PIP and DIP of thumb.   Hands swelling and knee pain bilaterally- seeing Rheumatology within a month or two.    Treatment to date-  Wrapped with ace bandage- helped somewhat Ibuprofen- helped Ice- helpful somewhat   Review of Systems  Constitutional: Negative for fever, chills, diaphoresis and fatigue.  Eyes: Negative for visual disturbance.  Respiratory: Negative for chest tightness, shortness of breath and wheezing.   Cardiovascular: Negative for chest pain, palpitations and leg swelling.  Gastrointestinal: Negative for nausea, vomiting and diarrhea.  Skin: Negative for color change, rash and wound.  Neurological: Negative for dizziness, syncope and headaches.  Hematological: Does not bruise/bleed easily.   Past Medical History  Diagnosis Date  . Gastric ulcer 2012    NSAID induced   . History of cardiac catheterization 2012    normal   . Gastric ulcer due to Helicobacter pylori 1601    and NSAID use  . Irritable bowel     with alternating constipation/bloating and diarrhea    History   Social History  . Marital Status: Single    Spouse Name: N/A    Number of Children: N/A  . Years of Education: N/A   Occupational History  . nurses aide     Hospice   Social History Main Topics  . Smoking status: Never Smoker   . Smokeless tobacco: Never Used  . Alcohol Use: Yes  . Drug Use: No  . Sexual Activity: Not on file   Other Topics  Concern  . Not on file   Social History Narrative    Past Surgical History  Procedure Laterality Date  . Abdominal hysterectomy      Family History  Problem Relation Age of Onset  . Heart disease Father 55    died of massive mi  . Heart disease Brother 80    has had 4 MIs, cardiomyopathy  . Celiac disease Brother   . Cancer Maternal Grandmother     esophagreal and stomach cancer  . Ulcerative colitis Mother     Allergies  Allergen Reactions  . Atorvastatin Other (See Comments)    Myalgias and flu like symptoms  . Nitroglycerin Other (See Comments)    Patient reported having seizure with second dose.  . Prednisone Nausea And Vomiting    Abdominal cramping  . Vicodin [Hydrocodone-Acetaminophen] Nausea And Vomiting    Current Outpatient Prescriptions on File Prior to Visit  Medication Sig Dispense Refill  . citalopram (CELEXA) 20 MG tablet Take 2 tablets (40 mg total) by mouth daily. 20 tablet 0  . meclizine (ANTIVERT) 50 MG tablet Take 1 tablet (50 mg total) by mouth 3 (three) times daily as needed. For vertigo 60 tablet 2  . promethazine (PHENERGAN) 25 MG tablet Take 1 tablet (25 mg total) by mouth every 8 (eight) hours as needed for nausea. 30 tablet 1  . rizatriptan (MAXALT-MLT) 10 MG disintegrating tablet Take  10 mg by mouth as needed for migraine. May repeat in 2 hours if needed    . rizatriptan (MAXALT-MLT) 10 MG disintegrating tablet DISSOLVE 1 TABLET ON THE TONGUE AS NEEDED FOR MIGRAINE, MAY REPEAT IN 2 HOURS IF NEEDED 10 tablet 3  . topiramate (TOPAMAX) 25 MG capsule TAKE 1 CAPSULE BY MOUTH 2 (TWO) TIMES DAILY. INCREASE TO 2 CAPSULES ONCE A DAY AFTER 2 WEEKS 60 capsule 0  . topiramate (TOPAMAX) 25 MG capsule TAKE 1 CAPSULE BY MOUTH 2 (TWO) TIMES DAILY. INCREASE TO 2 CAPSULES ONCE A DAY AFTER 2 WEEKS 60 capsule 5  . [DISCONTINUED] estradiol (ESTRACE) 0.5 MG tablet Take 1 tablet (0.5 mg total) by mouth daily. 30 tablet 11  . [DISCONTINUED] simvastatin (ZOCOR) 40 MG  tablet Take 1 tablet (40 mg total) by mouth every evening. 90 tablet 2   No current facility-administered medications on file prior to visit.         Objective:   Physical Exam  Constitutional: She is oriented to person, place, and time. She appears well-developed and well-nourished.  HENT:  Head: Normocephalic and atraumatic.  Musculoskeletal: She exhibits tenderness. She exhibits no edema.  Right wrist scaphoid tenderness on palpation. Patient has full ROM, but stated it felt tight bilaterally.  Neurological: She is alert and oriented to person, place, and time.  Skin: Skin is warm and dry. No rash noted.  Psychiatric: She has a normal mood and affect. Her behavior is normal. Judgment and thought content normal.   BP 120/62 mmHg  Pulse 84  Temp(Src) 97.4 F (36.3 C) (Oral)  Resp 12  Ht 5\' 4"  (1.626 m)  Wt 177 lb 1.9 oz (80.341 kg)  BMI 30.39 kg/m2  SpO2 97%     Assessment & Plan:

## 2014-12-05 ENCOUNTER — Telehealth: Payer: Self-pay | Admitting: Internal Medicine

## 2014-12-06 DIAGNOSIS — M79646 Pain in unspecified finger(s): Secondary | ICD-10-CM | POA: Insufficient documentation

## 2014-12-06 DIAGNOSIS — M79609 Pain in unspecified limb: Secondary | ICD-10-CM | POA: Insufficient documentation

## 2014-12-06 NOTE — Assessment & Plan Note (Signed)
Not improving. Obtain x-ray of right hand, and pt was given (see charge capture) a wrist brace for decreasing movement in right hand/thumb. Instructed to ice and continue with ibuprofen for pain. FU after seeing Rheumatology or if worsening.

## 2014-12-09 ENCOUNTER — Telehealth: Payer: Self-pay | Admitting: Internal Medicine

## 2014-12-09 ENCOUNTER — Ambulatory Visit: Payer: PRIVATE HEALTH INSURANCE | Admitting: Internal Medicine

## 2014-12-09 NOTE — Telephone Encounter (Signed)
Left message for patient to return call to office. 

## 2014-12-09 NOTE — Telephone Encounter (Signed)
Receive verbal from [place] NP X-RAY normal. Tried to call patient no answer left message to call office.

## 2014-12-09 NOTE — Telephone Encounter (Signed)
Patient notified and voiced understanding.

## 2014-12-20 ENCOUNTER — Ambulatory Visit: Payer: PRIVATE HEALTH INSURANCE | Admitting: Nurse Practitioner

## 2015-01-11 ENCOUNTER — Other Ambulatory Visit: Payer: Self-pay | Admitting: Internal Medicine

## 2015-01-13 NOTE — Telephone Encounter (Signed)
The amount was changed to #60 monthly refill sent

## 2015-01-13 NOTE — Telephone Encounter (Signed)
Last refill 1.4.16, last OV 1.11.16.  Please advise refill

## 2015-01-27 ENCOUNTER — Telehealth: Payer: Self-pay

## 2015-01-27 DIAGNOSIS — G4700 Insomnia, unspecified: Secondary | ICD-10-CM

## 2015-01-27 MED ORDER — ALPRAZOLAM 0.5 MG PO TABS: 0.5000 mg | ORAL_TABLET | Freq: Every evening | ORAL | Status: DC | PRN

## 2015-01-27 NOTE — Telephone Encounter (Signed)
Patient notified and voiced understanding.

## 2015-01-27 NOTE — Telephone Encounter (Signed)
The patient called and is hoping to get a refill of her xananx.  She stated she has not had this rx for a bit, but is hoping to get a refill.

## 2015-01-27 NOTE — Telephone Encounter (Signed)
Last fill was 5/15 ok to fill?

## 2015-01-27 NOTE — Telephone Encounter (Signed)
Ok to refill,  printed rx  

## 2015-02-04 ENCOUNTER — Other Ambulatory Visit: Payer: Self-pay | Admitting: Internal Medicine

## 2015-02-19 DIAGNOSIS — K589 Irritable bowel syndrome without diarrhea: Secondary | ICD-10-CM | POA: Insufficient documentation

## 2015-02-19 DIAGNOSIS — Z8719 Personal history of other diseases of the digestive system: Secondary | ICD-10-CM | POA: Insufficient documentation

## 2015-02-19 DIAGNOSIS — Z9889 Other specified postprocedural states: Secondary | ICD-10-CM | POA: Insufficient documentation

## 2015-02-20 ENCOUNTER — Encounter: Payer: Self-pay | Admitting: Internal Medicine

## 2015-02-20 ENCOUNTER — Ambulatory Visit (INDEPENDENT_AMBULATORY_CARE_PROVIDER_SITE_OTHER): Payer: PRIVATE HEALTH INSURANCE | Admitting: Internal Medicine

## 2015-02-20 ENCOUNTER — Encounter: Payer: Self-pay | Admitting: *Deleted

## 2015-02-20 VITALS — BP 142/80 | HR 89 | Temp 98.3°F | Resp 14 | Ht 63.0 in | Wt 177.0 lb

## 2015-02-20 DIAGNOSIS — R1030 Lower abdominal pain, unspecified: Secondary | ICD-10-CM | POA: Diagnosis not present

## 2015-02-20 DIAGNOSIS — K625 Hemorrhage of anus and rectum: Secondary | ICD-10-CM

## 2015-02-20 DIAGNOSIS — K921 Melena: Secondary | ICD-10-CM | POA: Diagnosis not present

## 2015-02-20 DIAGNOSIS — N950 Postmenopausal bleeding: Secondary | ICD-10-CM

## 2015-02-20 DIAGNOSIS — R140 Abdominal distension (gaseous): Secondary | ICD-10-CM

## 2015-02-20 LAB — CBC WITH DIFFERENTIAL/PLATELET
Basophils Absolute: 0 10*3/uL (ref 0.0–0.1)
Basophils Relative: 0.2 % (ref 0.0–3.0)
EOS PCT: 3.1 % (ref 0.0–5.0)
Eosinophils Absolute: 0.1 10*3/uL (ref 0.0–0.7)
HEMATOCRIT: 35.3 % — AB (ref 36.0–46.0)
Hemoglobin: 12.7 g/dL (ref 12.0–15.0)
LYMPHS ABS: 1.2 10*3/uL (ref 0.7–4.0)
Lymphocytes Relative: 29.5 % (ref 12.0–46.0)
MCHC: 36 g/dL (ref 30.0–36.0)
MCV: 82.3 fl (ref 78.0–100.0)
MONO ABS: 0.2 10*3/uL (ref 0.1–1.0)
Monocytes Relative: 4.2 % (ref 3.0–12.0)
Neutro Abs: 2.5 10*3/uL (ref 1.4–7.7)
Neutrophils Relative %: 63 % (ref 43.0–77.0)
PLATELETS: 503 10*3/uL — AB (ref 150.0–400.0)
RBC: 4.3 Mil/uL (ref 3.87–5.11)
RDW: 14.6 % (ref 11.5–15.5)
WBC: 4 10*3/uL (ref 4.0–10.5)

## 2015-02-20 LAB — BASIC METABOLIC PANEL
BUN: 8 mg/dL (ref 6–23)
CO2: 30 meq/L (ref 19–32)
CREATININE: 0.84 mg/dL (ref 0.40–1.20)
Calcium: 9.3 mg/dL (ref 8.4–10.5)
Chloride: 104 mEq/L (ref 96–112)
GFR: 74.55 mL/min (ref >60.00)
Glucose, Bld: 100 mg/dL — ABNORMAL HIGH (ref 70–99)
Potassium: 4.6 mEq/L (ref 3.5–5.1)
Sodium: 139 mEq/L (ref 135–145)

## 2015-02-20 LAB — HEMOCCULT GUIAC POC 1CARD (OFFICE): FECAL OCCULT BLD: POSITIVE

## 2015-02-20 LAB — C-REACTIVE PROTEIN: CRP: 0.2 mg/dL — ABNORMAL LOW (ref 0.5–20.0)

## 2015-02-20 MED ORDER — DICYCLOMINE HCL 20 MG PO TABS: 20.0000 mg | ORAL_TABLET | Freq: Four times a day (QID) | ORAL | Status: DC

## 2015-02-20 MED ORDER — PEG 3350/ELECTROLYTES 240 G PO SOLR: ORAL | Status: DC

## 2015-02-20 NOTE — Patient Instructions (Signed)
Trial of dicyclomine , antispasmodic for your bowel,  Every 6 hours  Go Lytely to clean out your bowel, Drink one glass every 30 minutes until you start passing mostly water.  If no improvement ,  We will set up a CT scan of abdomen and pelvis next week

## 2015-02-20 NOTE — Progress Notes (Signed)
Pre-visit discussion using our clinic review tool. No additional management support is needed unless otherwise documented below in the visit note.  

## 2015-02-20 NOTE — Progress Notes (Signed)
Patient ID: CHONTEL WARNING, female   DOB: 28-Aug-1961, 54 y.o.   MRN: [id number]  Patient Active Problem List   Diagnosis Date Noted  . Rectal bleeding 02/23/2015  . Abdominal bloating 02/23/2015  . Thumb pain 12/06/2014  . Postmenopausal vaginal bleeding 11/10/2014  . Petechial rash 11/08/2014  . Cluster headache, episodic 10/09/2014  . Transaminitis 05/15/2014  . Enlarged submental lymph node 03/12/2014  . Statin intolerance 12/02/2013  . Encounter for preventive health examination 08/05/2013  . Polyarthralgia 03/11/2013  . Anxiety and depression 10/27/2012  . Irritable bowel   . Hyperlipidemia LDL goal < 130 02/20/2012  . Menopause 10/02/2011  . Obesity 10/02/2011  . Headache, migraine 09/29/2011  . History of colonoscopy 09/29/2011  . History of gastric ulcer   . History of cardiac catheterization     Subjective:  CC:   Chief Complaint  Patient presents with  . Back Pain    4 bloody stools started on 02/19/15, Seen at urgent care 02/19/15 tested for UTI came back negative and liver enzymes checked which came back normal stated by patient.    HPI:   CAYLA WIEGAND is a 54 y.o. female who presents for  BRBPR for the last 24 hours,  Stools have been alternating from soli//very hard to loose.  She has been having back pain and abd pain for the past 2 weeks accompanied by feeling bloated.  Constipation has been intermittent,  Has a history of IBS with no prior trial of a daily  bulk forming laxative.     She is requesting  FMLA for current illness. She has missed a significant amount of time due to family illnesses, and has to have FMLA  for her current illness .    Past Medical History  Diagnosis Date  . Gastric ulcer 2012    NSAID induced   . History of cardiac catheterization 2012    normal   . Gastric ulcer due to Helicobacter pylori 5638    and NSAID use  . Irritable bowel     with alternating constipation/bloating and diarrhea    Past Surgical History   Procedure Laterality Date  . Abdominal hysterectomy         The following portions of the patient's history were reviewed and updated as appropriate: Allergies, current medications, and problem list.    Review of Systems:   Patient denies headache, fevers, malaise, unintentional weight loss, skin rash, eye pain, sinus congestion and sinus pain, sore throat, dysphagia,  hemoptysis , cough, dyspnea, wheezing, chest pain, palpitations, orthopnea, edema, abdominal pain, nausea, melena, diarrhea, constipation, flank pain, dysuria, hematuria, urinary  Frequency, nocturia, numbness, tingling, seizures,  Focal weakness, Loss of consciousness,  Tremor, insomnia, depression, anxiety, and suicidal ideation.     History   Social History  . Marital Status: Single    Spouse Name: N/A  . Number of Children: N/A  . Years of Education: N/A   Occupational History  . nurses aide     Hospice   Social History Main Topics  . Smoking status: Never Smoker   . Smokeless tobacco: Never Used  . Alcohol Use: Yes  . Drug Use: No  . Sexual Activity: Not on file   Other Topics Concern  . Not on file   Social History Narrative    Objective:  Filed Vitals:   02/20/15 0833  BP: 142/80  Pulse: 89  Temp: 98.3 F (36.8 C)  Resp: 14     General appearance: alert, cooperative and  appears stated age Ears: normal TM's and external ear canals both ears Throat: lips, mucosa, and tongue normal; teeth and gums normal Neck: no adenopathy, no carotid bruit, supple, symmetrical, trachea midline and thyroid not enlarged, symmetric, no tenderness/mass/nodules Back: symmetric, no curvature. ROM normal. No CVA tenderness. Lungs: clear to auscultation bilaterally Heart: regular rate and rhythm, S1, S2 normal, no murmur, click, rub or gallop Abdomen: soft, non-tender; bowel sounds normal; no masses,  no organomegaly Pulses: 2+ and symmetric Skin: Skin color, texture, turgor normal. No rashes or  lesions Lymph nodes: Cervical, supraclavicular, and axillary nodes normal. Rectal:  Internal hemorrhoids,  Pinked tingued mucous, heme positive  Assessment and Plan:  Rectal bleeding Rectal exam was notable for internal henorrhoids and was heme positive. Suggested that symptoms were due to constipation induced hemorrhoidal bleeding.  Trial of golytely.   Lab Results  Component Value Date   WBC 4.0 02/20/2015   HGB 12.7 02/20/2015   HCT 35.3* 02/20/2015   MCV 82.3 02/20/2015   PLT 503.0* 02/20/2015   Lab Results  Component Value Date   INR 1.0 11/08/2014      Abdominal bloating Likely due to constipation,  But she is post menopausal and had an episode of vaginal bleeding several months ago ans still has her ovaries.  Will check a CA 125 followed by either ultrasound or CT abdomen depending on the results of the bowel evacuation    Updated Medication List Outpatient Encounter Prescriptions as of 02/20/2015  Medication Sig  . ALPRAZolam (XANAX) 0.5 MG tablet Take 1 tablet (0.5 mg total) by mouth at bedtime as needed for sleep.  . citalopram (CELEXA) 20 MG tablet TAKE 2 TABLETS (40 MG TOTAL) BY MOUTH DAILY.  . meclizine (ANTIVERT) 50 MG tablet Take 1 tablet (50 mg total) by mouth 3 (three) times daily as needed. For vertigo  . promethazine (PHENERGAN) 25 MG tablet Take 1 tablet (25 mg total) by mouth every 8 (eight) hours as needed for nausea.  . rizatriptan (MAXALT-MLT) 10 MG disintegrating tablet Take 10 mg by mouth as needed for migraine. May repeat in 2 hours if needed  . rizatriptan (MAXALT-MLT) 10 MG disintegrating tablet DISSOLVE 1 TABLET ON THE TONGUE AS NEEDED FOR MIGRAINE, MAY REPEAT IN 2 HOURS IF NEEDED  . topiramate (TOPAMAX) 25 MG capsule TAKE 1 CAPSULE BY MOUTH 2 (TWO) TIMES DAILY. INCREASE TO 2 CAPSULES ONCE A DAY AFTER 2 WEEKS  . topiramate (TOPAMAX) 25 MG capsule TAKE 1 CAPSULE BY MOUTH 2 (TWO) TIMES DAILY. INCREASE TO 2 CAPSULES ONCE A DAY AFTER 2 WEEKS  .  dicyclomine (BENTYL) 20 MG tablet Take 1 tablet (20 mg total) by mouth every 6 (six) hours.  [name] PEG 3350-KCl-NaBcb-NaCl-NaSulf (PEG 3350/ELECTROLYTES) 240 G SOLR Drink one glass every 30 minutes until consipation is resolved     Orders Placed This Encounter  Procedures  . CBC with Differential/Platelet  . CA 125  . Basic metabolic panel  . C-reactive protein  . HM PAP SMEAR  . POCT Occult Blood Stool    No Follow-up on file.

## 2015-02-21 LAB — CA 125: CA 125: 11 U/mL (ref <35)

## 2015-02-23 ENCOUNTER — Encounter: Payer: Self-pay | Admitting: Internal Medicine

## 2015-02-23 DIAGNOSIS — K625 Hemorrhage of anus and rectum: Secondary | ICD-10-CM | POA: Insufficient documentation

## 2015-02-23 DIAGNOSIS — Z7689 Persons encountering health services in other specified circumstances: Secondary | ICD-10-CM

## 2015-02-23 DIAGNOSIS — R140 Abdominal distension (gaseous): Secondary | ICD-10-CM | POA: Insufficient documentation

## 2015-02-23 NOTE — Assessment & Plan Note (Signed)
Likely due to constipation,  But she is post menopausal and had an episode of vaginal bleeding several months ago ans still has her ovaries.  Will check a CA 125 followed by either ultrasound or CT abdomen depending on the results of the bowel evacuation

## 2015-02-23 NOTE — Assessment & Plan Note (Addendum)
Rectal exam was notable for internal henorrhoids and was heme positive. Suggested that symptoms were due to constipation induced hemorrhoidal bleeding.  Trial of golytely.   Lab Results  Component Value Date   WBC 4.0 02/20/2015   HGB 12.7 02/20/2015   HCT 35.3* 02/20/2015   MCV 82.3 02/20/2015   PLT 503.0* 02/20/2015   Lab Results  Component Value Date   INR 1.0 11/08/2014

## 2015-02-24 ENCOUNTER — Telehealth: Payer: Self-pay | Admitting: Internal Medicine

## 2015-02-24 ENCOUNTER — Encounter: Payer: Self-pay | Admitting: Internal Medicine

## 2015-02-24 DIAGNOSIS — K921 Melena: Secondary | ICD-10-CM

## 2015-02-24 DIAGNOSIS — R1032 Left lower quadrant pain: Secondary | ICD-10-CM

## 2015-02-24 NOTE — Telephone Encounter (Signed)
Please advise 

## 2015-02-24 NOTE — Telephone Encounter (Signed)
Patient transferred to [facility]. Bloody stools and low back pain.

## 2015-02-24 NOTE — Telephone Encounter (Signed)
Patient needs to read my chart message  Sent earlier  this morning  Re fmla, form whic is done and in red folder .  Will order CT

## 2015-02-24 NOTE — Telephone Encounter (Signed)
Patient was seen by Dr. Derrel Nip on Thursday 02/20/15. Spoke to patient who stated that since being seen by Dr. Derrel Nip last week her symptoms are not getting any better. Patient is still experiencing lower abdominal pain, Lower back pain and has had 2 additional bloody stools since office visit. Patient stated that Dr. Derrel Nip advised her to call if symptoms have not improved. Patient stated that Dr. Derrel Nip mentioned ordering a CT Scan if things had not improved. Patient also asked if FMLA paperwork had been completed. Please advise.

## 2015-02-24 NOTE — Telephone Encounter (Signed)
[facility]     Patient Name: [facility] Initial Comment Caller states saw MD Thursday for back pain and bloody stool; was to call back if still having sx; had 2 more bloody stools Fri when doing "Golightly"; back hurts worse when having BM; was to call back if not better;   DOB: 12-25-1960      Nurse Assessment  Nurse: Donalynn Furlong, RN, Myna Hidalgo Date/Time ([place] Time): 02/24/2015 9:01:08 AM  Confirm and document reason for call. If symptomatic, describe symptoms. ---Caller states saw MD Thursday for back pain and bloody stool; was to call back if still having sx; had 2 more bloody stools Fri when doing "Golightly"; back hurts worse when having BM; was to call back if not better;  Has the patient traveled out of the country within the last 30 days? ---Not Applicable  Does the patient require triage? ---No  Please document clinical information provided and list any resource used. ---Pt has already been seen by Dr Derrel Nip, and was wanting to speak to the office staff to inquire about the next step involving her care as far as testing was concerned, as per her conversation with [facility] last week, pending her follow-up call today. She is also wanting FMLA papers    Guidelines     Guideline Title Affirmed Question Affirmed Notes   Information Only Call Health Information question, no triage required and triager able to answer question    Final Disposition User   Clinical Call

## 2015-02-24 NOTE — Telephone Encounter (Signed)
Notified patient of Email message, and patient notified FMLA paper work ready for pick up, Placed copy to chart and to billing.

## 2015-02-27 ENCOUNTER — Ambulatory Visit
Admit: 2015-02-27 | Disposition: A | Discharge status: Home / Self Care | Payer: Self-pay | Attending: Internal Medicine | Admitting: Internal Medicine

## 2015-02-28 ENCOUNTER — Ambulatory Visit: Payer: PRIVATE HEALTH INSURANCE | Admitting: Internal Medicine

## 2015-03-03 ENCOUNTER — Encounter: Payer: Self-pay | Admitting: Internal Medicine

## 2015-03-03 DIAGNOSIS — F32A Depression, unspecified: Secondary | ICD-10-CM

## 2015-03-03 DIAGNOSIS — K625 Hemorrhage of anus and rectum: Secondary | ICD-10-CM

## 2015-03-03 DIAGNOSIS — F329 Major depressive disorder, single episode, unspecified: Secondary | ICD-10-CM

## 2015-03-05 ENCOUNTER — Telehealth: Payer: Self-pay

## 2015-03-05 NOTE — Telephone Encounter (Signed)
The patient called back and stated she called the hospital and the staff at the hospital informed her we would have the results.  The encounter was printed and given to T.[place] to speak with the patient directly.

## 2015-03-05 NOTE — Telephone Encounter (Signed)
The patient called hoping to get the results of her CT-Scan.  She sent a mychart msg to [facility] on 4/4, but has not heard back.  She was also sent to the triage line, but called back stating she did not want to leave a message with triage.  She stated "last time she was told that was the wrong line call".  Please call the patient and relay her CT results. Thanks!

## 2015-03-05 NOTE — Telephone Encounter (Addendum)
Per verbal from dr. Derrel Nip scan is normal,

## 2015-03-05 NOTE — Telephone Encounter (Signed)
My Chart message received by RN, Scan report placed in yellow folder to day please advise.

## 2015-03-05 NOTE — Telephone Encounter (Signed)
Spoke with Patient on phone and reviewed CT results.  Patient verbalized understanding and was happy to finally get her results.

## 2015-03-15 ENCOUNTER — Other Ambulatory Visit: Payer: Self-pay | Admitting: Internal Medicine

## 2015-03-17 NOTE — Telephone Encounter (Signed)
Please advise 

## 2015-03-17 NOTE — Telephone Encounter (Signed)
Refill

## 2015-03-18 NOTE — Telephone Encounter (Signed)
Ok to refill,  Refill sent  

## 2015-03-24 ENCOUNTER — Telehealth: Payer: Self-pay | Admitting: *Deleted

## 2015-03-26 ENCOUNTER — Encounter: Payer: Self-pay | Admitting: Internal Medicine

## 2015-03-28 ENCOUNTER — Ambulatory Visit: Payer: PRIVATE HEALTH INSURANCE | Admitting: Internal Medicine

## 2015-03-31 ENCOUNTER — Encounter: Payer: Self-pay | Admitting: Radiology

## 2015-03-31 ENCOUNTER — Emergency Department
Admission: EM | Admit: 2015-03-31 | Discharge: 2015-04-01 | Disposition: A | Discharge status: Home / Self Care | Payer: 59 | Attending: Emergency Medicine | Admitting: Emergency Medicine

## 2015-03-31 ENCOUNTER — Emergency Department: Payer: 59

## 2015-03-31 ENCOUNTER — Other Ambulatory Visit: Payer: Self-pay

## 2015-03-31 DIAGNOSIS — R1013 Epigastric pain: Secondary | ICD-10-CM

## 2015-03-31 DIAGNOSIS — Z79899 Other long term (current) drug therapy: Secondary | ICD-10-CM | POA: Insufficient documentation

## 2015-03-31 DIAGNOSIS — R1110 Vomiting, unspecified: Secondary | ICD-10-CM | POA: Diagnosis not present

## 2015-03-31 DIAGNOSIS — R109 Unspecified abdominal pain: Secondary | ICD-10-CM | POA: Diagnosis present

## 2015-03-31 LAB — LIPASE, BLOOD: Lipase: 30 U/L (ref 22–51)

## 2015-03-31 LAB — COMPREHENSIVE METABOLIC PANEL
ALT: 22 U/L (ref 14–54)
AST: 31 U/L (ref 15–41)
Albumin: 4.6 g/dL (ref 3.5–5.0)
Alkaline Phosphatase: 95 U/L (ref 38–126)
Anion gap: 14 (ref 5–15)
BUN: 13 mg/dL (ref 6–20)
CO2: 21 mmol/L — AB (ref 22–32)
Calcium: 9.8 mg/dL (ref 8.9–10.3)
Chloride: 106 mmol/L (ref 101–111)
Creatinine, Ser: 1 mg/dL (ref 0.44–1.00)
GFR calc Af Amer: >60 mL/min (ref >60)
GFR calc non Af Amer: >60 mL/min (ref >60)
GLUCOSE: 111 mg/dL — AB (ref 65–99)
Potassium: 3.7 mmol/L (ref 3.5–5.1)
Sodium: 141 mmol/L (ref 135–145)
TOTAL PROTEIN: 8.4 g/dL — AB (ref 6.5–8.1)
Total Bilirubin: 0.4 mg/dL (ref 0.3–1.2)

## 2015-03-31 LAB — CBC WITH DIFFERENTIAL/PLATELET
Basophils Absolute: 0.1 10*3/uL (ref 0–0.1)
Basophils Relative: 1 %
Eosinophils Absolute: 0 10*3/uL (ref 0–0.7)
Eosinophils Relative: 1 %
HCT: 41.7 % (ref 35.0–47.0)
Hemoglobin: 13.5 g/dL (ref 12.0–16.0)
Lymphs Abs: 2.5 10*3/uL (ref 1.0–3.6)
MCH: 27.4 pg (ref 26.0–34.0)
MCHC: 32.3 g/dL (ref 32.0–36.0)
MCV: 84.7 fL (ref 80.0–100.0)
MONO ABS: 0.3 10*3/uL (ref 0.2–0.9)
Monocytes Relative: 5 %
NEUTROS ABS: 3.5 10*3/uL (ref 1.4–6.5)
Platelets: 292 10*3/uL (ref 150–440)
RBC: 4.93 MIL/uL (ref 3.80–5.20)
RDW: 13.9 % (ref 11.5–14.5)
WBC: 6.4 10*3/uL (ref 3.6–11.0)

## 2015-03-31 LAB — URINALYSIS COMPLETE WITH MICROSCOPIC (ARMC ONLY)
Bacteria, UA: NONE SEEN
Bilirubin Urine: NEGATIVE
GLUCOSE, UA: NEGATIVE mg/dL
HGB URINE DIPSTICK: NEGATIVE
Leukocytes, UA: NEGATIVE
Nitrite: NEGATIVE
PH: 8 (ref 5.0–8.0)
PROTEIN: NEGATIVE mg/dL
SPECIFIC GRAVITY, URINE: 1.004 — AB (ref 1.005–1.030)

## 2015-03-31 MED ORDER — MORPHINE SULFATE 4 MG/ML IJ SOLN
4.0000 mg | Freq: Once | INTRAMUSCULAR | Status: AC
  Administered 2015-03-31: 4 mg via INTRAVENOUS

## 2015-03-31 MED ORDER — ONDANSETRON HCL 4 MG/2ML IJ SOLN
4.0000 mg | Freq: Once | INTRAMUSCULAR | Status: AC
  Administered 2015-03-31: 4 mg via INTRAVENOUS

## 2015-03-31 MED ORDER — PANTOPRAZOLE SODIUM 40 MG PO TBEC: 40.0000 mg | DELAYED_RELEASE_TABLET | Freq: Every day | ORAL | Status: DC

## 2015-03-31 MED ORDER — OXYCODONE-ACETAMINOPHEN 5-325 MG PO TABS: 1.0000 | ORAL_TABLET | Freq: Four times a day (QID) | ORAL | Status: DC | PRN

## 2015-03-31 MED ORDER — GI COCKTAIL ~~LOC~~
30.0000 mL | Freq: Once | ORAL | Status: AC
  Administered 2015-03-31: 30 mL via ORAL

## 2015-03-31 MED ORDER — IOHEXOL 240 MG/ML SOLN
25.0000 mL | Freq: Once | INTRAMUSCULAR | Status: AC | PRN
  Administered 2015-03-31: 25 mL via ORAL

## 2015-03-31 MED ORDER — IOHEXOL 300 MG/ML  SOLN
100.0000 mL | Freq: Once | INTRAMUSCULAR | Status: AC | PRN
  Administered 2015-03-31: 100 mL via INTRAVENOUS

## 2015-03-31 MED ORDER — ONDANSETRON HCL 4 MG/2ML IJ SOLN
INTRAMUSCULAR | Status: AC
  Administered 2015-03-31: 4 mg via INTRAVENOUS
  Filled 2015-03-31: qty 2

## 2015-03-31 MED ORDER — SUCRALFATE 1 GM/10ML PO SUSP: 1.0000 g | Freq: Four times a day (QID) | ORAL | Status: DC

## 2015-03-31 MED ORDER — GI COCKTAIL ~~LOC~~
ORAL | Status: AC
  Administered 2015-03-31: 30 mL via ORAL
  Filled 2015-03-31: qty 30

## 2015-03-31 MED ORDER — MORPHINE SULFATE 4 MG/ML IJ SOLN
INTRAMUSCULAR | Status: AC
  Filled 2015-03-31: qty 1

## 2015-03-31 MED ORDER — MORPHINE SULFATE 4 MG/ML IJ SOLN
INTRAMUSCULAR | Status: AC
  Administered 2015-03-31: 4 mg via INTRAVENOUS
  Filled 2015-03-31: qty 1

## 2015-03-31 MED ORDER — ONDANSETRON HCL 4 MG PO TABS: 4.0000 mg | ORAL_TABLET | Freq: Every day | ORAL | Status: AC | PRN

## 2015-03-31 NOTE — Discharge Instructions (Signed)

## 2015-03-31 NOTE — ED Notes (Signed)
Pt states she was curling her hair about 1 hour ago and had sudden onset up upper abd. Pain with n,v and sob, hx of DVT

## 2015-03-31 NOTE — ED Provider Notes (Signed)
[facility] Emergency Department Provider Note   ____________________________________________  Time seen: 1650  I have reviewed the triage vital signs and the nursing notes.   HISTORY  Chief Complaint Abdominal Pain   HPI Nichole Riddle is a 53 y.o. female who presents to the emergency department today with acute onset of epigastric abdominal pain. Patient states this pain started roughly 2-1/2 hours ago. Has been persistent as well as waxing and waning from a very extreme sharp pain to a more moderate pain. The pain radiates to her back. Patient denies any eliciting or relieving factors. She does have associated nausea but no vomiting. Denies any recent fevers. Denies any recent change in bowel movements.  Past Medical History  Diagnosis Date  . Gastric ulcer 2012    NSAID induced   . History of cardiac catheterization 2012    normal   . Gastric ulcer due to Helicobacter pylori 5427    and NSAID use  . Irritable bowel     with alternating constipation/bloating and diarrhea    Patient Active Problem List   Diagnosis Date Noted  . Rectal bleeding 02/23/2015  . Abdominal bloating 02/23/2015  . Thumb pain 12/06/2014  . Postmenopausal vaginal bleeding 11/10/2014  . Petechial rash 11/08/2014  . Cluster headache, episodic 10/09/2014  . Transaminitis 05/15/2014  . Enlarged submental lymph node 03/12/2014  . Statin intolerance 12/02/2013  . Encounter for preventive health examination 08/05/2013  . Polyarthralgia 03/11/2013  . Anxiety and depression 10/27/2012  . Irritable bowel   . Hyperlipidemia LDL goal < 130 02/20/2012  . Menopause 10/02/2011  . Obesity 10/02/2011  . Headache, migraine 09/29/2011  . History of colonoscopy 09/29/2011  . History of gastric ulcer   . History of cardiac catheterization     Past Surgical History  Procedure Laterality Date  . Abdominal hysterectomy      Current Outpatient Rx  Name  Route  Sig  Dispense  Refill   . ALPRAZolam (XANAX) 0.5 MG tablet   Oral   Take 1 tablet (0.5 mg total) by mouth at bedtime as needed for sleep.   30 tablet   3   . citalopram (CELEXA) 20 MG tablet      TAKE 2 TABLETS (40 MG TOTAL) BY MOUTH DAILY.   60 tablet   1   . dicyclomine (BENTYL) 20 MG tablet      TAKE 1 TABLET (20 MG TOTAL) BY MOUTH EVERY 6 (SIX) HOURS.   120 tablet   0   . meclizine (ANTIVERT) 50 MG tablet   Oral   Take 1 tablet (50 mg total) by mouth 3 (three) times daily as needed. For vertigo   60 tablet   2   . PEG 3350-KCl-NaBcb-NaCl-NaSulf (PEG 3350/ELECTROLYTES) 240 G SOLR      Drink one glass every 30 minutes until consipation is resolved   4000 mL   0   . promethazine (PHENERGAN) 25 MG tablet   Oral   Take 1 tablet (25 mg total) by mouth every 8 (eight) hours as needed for nausea.   30 tablet   1   . rizatriptan (MAXALT-MLT) 10 MG disintegrating tablet   Oral   Take 10 mg by mouth as needed for migraine. May repeat in 2 hours if needed         . rizatriptan (MAXALT-MLT) 10 MG disintegrating tablet      DISSOLVE 1 TABLET ON THE TONGUE AS NEEDED FOR MIGRAINE, MAY REPEAT IN 2 HOURS IF  NEEDED   10 tablet   3   . topiramate (TOPAMAX) 25 MG capsule      TAKE 1 CAPSULE BY MOUTH 2 (TWO) TIMES DAILY. INCREASE TO 2 CAPSULES ONCE A DAY AFTER 2 WEEKS   60 capsule   0   . topiramate (TOPAMAX) 25 MG capsule      TAKE 1 CAPSULE BY MOUTH 2 (TWO) TIMES DAILY. INCREASE TO 2 CAPSULES ONCE A DAY AFTER 2 WEEKS   60 capsule   5     Allergies Atorvastatin; Nitroglycerin; Prednisone; and Vicodin  Family History  Problem Relation Age of Onset  . Heart disease Father 75    died of massive mi  . Heart disease Brother 35    has had 4 MIs, cardiomyopathy  . Celiac disease Brother   . Cancer Maternal Grandmother     esophagreal and stomach cancer  . Ulcerative colitis Mother     Social History History  Substance Use Topics  . Smoking status: Never Smoker   . Smokeless  tobacco: Never Used  . Alcohol Use: Yes    Review of Systems Constitutional: Negative for fever. Cardiovascular: Negative for chest pain. Respiratory: Negative for shortness of breath. However pain takes her breath away. Gastrointestinal: Epigastric pain. No emesis or diarrhea Genitourinary: Negative for dysuria. Musculoskeletal: Negative for back pain. Skin: Negative for rash. Neurological: Negative for headaches, focal weakness or numbness.   10-point ROS otherwise negative.  ____________________________________________   PHYSICAL EXAM:  VITAL SIGNS: ED Triage Vitals  Enc Vitals Group     BP 03/31/15 1512 112/57 mmHg     Pulse Rate 03/31/15 1508 111     Resp 03/31/15 1508 20     Temp 03/31/15 1508 97.7 F (36.5 C)     Temp Source 03/31/15 1508 Oral     SpO2 03/31/15 1508 97 %     Weight 03/31/15 1508 172 lb (78.019 kg)     Height 03/31/15 1508 5\' 3"  (1.6 m)     Head Cir --      Peak Flow --      Pain Score 03/31/15 1510 10     Pain Loc --      Pain Edu? --      Excl. in [place]? --     Constitutional: Alert and oriented. Moderate pain. Eyes: Conjunctivae are normal. PERRL. Normal extraocular movements. ENT   Head: Normocephalic and atraumatic.   Nose: No congestion/rhinnorhea.   Mouth/Throat: Mucous membranes are moist.   Neck: No stridor. Hematological/Lymphatic/Immunilogical: No cervical lymphadenopathy. Cardiovascular: Tachycardic, regular rhythm.  Respiratory: Normal respiratory effort without tachypnea nor retractions. Breath sounds are clear and equal bilaterally. No wheezes/rales/rhonchi. Gastrointestinal: Soft, minimally tender to epigastric area. No distention. No abdominal bruits. There is no CVA tenderness. Genitourinary: deferred  Musculoskeletal: Normal range of motion in all extremities. Neurologic:  Normal speech and language. No gross focal neurologic deficits are appreciated. Speech is normal. No gait instability. Skin:  Skin is warm,  dry and intact. No rash noted. Psychiatric: Mood and affect are normal. Speech and behavior are normal. Patient exhibits appropriate insight and judgment.  ____________________________________________    LABS (pertinent positives/negatives)  Labs Reviewed  COMPREHENSIVE METABOLIC PANEL - Abnormal; Notable for the following:    CO2 21 (*)    Glucose, Bld 111 (*)    Total Protein 8.4 (*)    All other components within normal limits  CBC WITH DIFFERENTIAL/PLATELET  LIPASE, BLOOD      ____________________________________________   EKG  ED ECG REPORT   Date: 03/31/2015  EKG Time: 1504   Rate: 106  Rhythm: sinus tachycardia  Axis: LAD  Intervals:none  ST&T Change: t wave inversion V1   ____________________________________________    RADIOLOGY RUQ [place]: IMPRESSION: 1. No acute findings to account for the patient's symptoms. Specifically, no evidence of gallstones, and no findings to suggest an acute cholecystitis at this time.  CT abd/pel:  IMPRESSION: 1. No acute findings in the abdomen pelvis. 2. No change from comparison exam. ____________________________________________   PROCEDURES  Procedure(s) performed: None  Critical Care performed: No  ____________________________________________   INITIAL IMPRESSION / ASSESSMENT AND PLAN / ED COURSE  Pertinent labs & imaging results that were available during my care of the patient were reviewed by me and considered in my medical decision making (see chart for details).  Patient here with epigastric pain. On exam minimally tender in the epigastric region. Will check lab work including lipase and get a right upper quadrant ultrasound to evaluate for gallbladder disease.  ----------------------------------------- 5:37 PM on 03/31/2015 -----------------------------------------  Ultrasound negative for cholelithiasis. Will try GI cocktail given history of  ulcers.  ----------------------------------------- 10:42 PM on 03/31/2015 -----------------------------------------  Awaiting results of CT scan  ----------------------------------------- 11:40 PM on 03/31/2015 -----------------------------------------  CT scan without any concerning findings. Patient does appear to feel better at this point. Discussed with patient that I think likely this could be due to an ulcer. Will provide medications per patient states she does have a follow-up appointment with a GI doctor in the future however I encouraged her to attempt to move that closer to today. ____________________________________________   FINAL CLINICAL IMPRESSION(S) / ED DIAGNOSES  Final diagnoses:  Epigastric abdominal pain     Nance Pear, MD 03/31/15 2341

## 2015-03-31 NOTE — ED Notes (Signed)
Pt's s/o notified this RN that the pt has finished the bottle of oral contrast.

## 2015-04-01 ENCOUNTER — Ambulatory Visit: Payer: PRIVATE HEALTH INSURANCE | Admitting: Psychology

## 2015-04-01 NOTE — ED Notes (Signed)
Signature scanner device not working; pt signed paper copy and placed in chart.

## 2015-04-09 ENCOUNTER — Ambulatory Visit: Payer: 59 | Admitting: Internal Medicine

## 2015-04-09 ENCOUNTER — Encounter: Payer: Self-pay | Admitting: Internal Medicine

## 2015-04-17 ENCOUNTER — Encounter: Payer: Self-pay | Admitting: Nurse Practitioner

## 2015-04-17 ENCOUNTER — Telehealth: Payer: Self-pay | Admitting: Nurse Practitioner

## 2015-04-17 ENCOUNTER — Ambulatory Visit (INDEPENDENT_AMBULATORY_CARE_PROVIDER_SITE_OTHER): Payer: 59 | Admitting: Nurse Practitioner

## 2015-04-17 VITALS — BP 130/74 | HR 76 | Ht 63.25 in | Wt 176.2 lb

## 2015-04-17 DIAGNOSIS — R1011 Right upper quadrant pain: Secondary | ICD-10-CM

## 2015-04-17 DIAGNOSIS — R6881 Early satiety: Secondary | ICD-10-CM | POA: Diagnosis not present

## 2015-04-17 MED ORDER — OMEPRAZOLE 40 MG PO CPDR: DELAYED_RELEASE_CAPSULE | ORAL | Status: DC

## 2015-04-17 NOTE — Patient Instructions (Signed)
We sent a prescription for [facility], [place], [place].  1. Omeprazole 40 mg.   Continue the Miralax.   You have been scheduled for an endoscopy. Please follow written instructions given to you at your visit today. If you use inhalers (even only as needed), please bring them with you on the day of your procedure. Your physician has requested that you go to www.startemmi.com and enter the access code given to you at your visit today. This web site gives a general overview about your procedure. However, you should still follow specific instructions given to you by our office regarding your preparation for the procedure.

## 2015-04-17 NOTE — Progress Notes (Signed)
HPI :   Patient is a 54 year old female referred by PCP for evaluation of rectal bleeding which occurred for approximately one week in late March. Stools were hemoccult positive on rectal exam, bleeding felt to be secondary to hemorrhoids. Patient has not had any further bleeding. Bowel movements are relatively normal, she takes MiraLAX as needed.   Patient states she is mainly here for upper abdominal symptoms. Earlier this month she developed acute epigastric/right upper quadrant pain radiating through to her back. Symptoms prompted an ED visit where a right upper quadrant ultrasound, CT scan of the abdomen and pelvis with contrast , CBC unremarkable and CBC were all normal. LFTs normal except for mildly elevated total protein of 8.4. The severe pain resolved but patient continues to have right upper quadrant burning, worse with meals. She has early satiety and mild weight loss. Patient's mother had gastric/esophageal cancer, patient is concerned about her own symptoms. Patient hasn't taken NSAIDS in years. She gives a history of esophageal ulcers found by a gastroenterologist in [facility] 6 years ago. Ulcers apparently secondary to goody powders.   Past Medical History  Diagnosis Date  . Gastric ulcer 2012    NSAID induced   . History of cardiac catheterization 2012    normal   . Gastric ulcer due to Helicobacter pylori 8841    and NSAID use  . Irritable bowel     with alternating constipation/bloating and diarrhea  . Migraines   . HLD (hyperlipidemia)   . Multiple gastric ulcers     esophagus    Family History  Problem Relation Age of Onset  . Heart disease Father 8    died of massive mi  . Heart disease Brother 75    has had 4 MIs, cardiomyopathy  . Celiac disease Brother   . Esophageal cancer Maternal Grandmother   . Ulcerative colitis Mother   . Stomach cancer Maternal Grandmother   . Irritable bowel syndrome Mother   . Ulcerative colitis Mother    History    Substance Use Topics  . Smoking status: Never Smoker   . Smokeless tobacco: Never Used  . Alcohol Use: 0.0 oz/week    0 Standard drinks or equivalent per week     Comment: occasional-beer   Current Outpatient Prescriptions  Medication Sig Dispense Refill  . ALPRAZolam (XANAX) 0.5 MG tablet Take 1 tablet (0.5 mg total) by mouth at bedtime as needed for sleep. 30 tablet 3  . ondansetron (ZOFRAN) 4 MG tablet Take 1 tablet (4 mg total) by mouth daily as needed for nausea or vomiting. 30 tablet 1  . oxyCODONE-acetaminophen (ROXICET) 5-325 MG per tablet Take 1 tablet by mouth every 6 (six) hours as needed. 10 tablet 0  . Probiotic Product (PROBIOTIC DAILY PO) Take 1 tablet by mouth daily.    . promethazine (PHENERGAN) 25 MG tablet Take 1 tablet (25 mg total) by mouth every 8 (eight) hours as needed for nausea. 30 tablet 1  . rizatriptan (MAXALT-MLT) 10 MG disintegrating tablet DISSOLVE 1 TABLET ON THE TONGUE AS NEEDED FOR MIGRAINE, MAY REPEAT IN 2 HOURS IF NEEDED 10 tablet 3  . [DISCONTINUED] estradiol (ESTRACE) 0.5 MG tablet Take 1 tablet (0.5 mg total) by mouth daily. 30 tablet 11  . [DISCONTINUED] simvastatin (ZOCOR) 40 MG tablet Take 1 tablet (40 mg total) by mouth every evening. 90 tablet 2   No current facility-administered medications for this visit.   Allergies  Allergen Reactions  . Lipitor [Atorvastatin] Other (See  Comments)    Myalgias and flu like symptoms  . Nitroglycerin Other (See Comments)    Patient reported having seizure with second dose.  . Prednisone Nausea And Vomiting    Abdominal cramping  . Topamax [Topiramate]   . Vicodin [Hydrocodone-Acetaminophen] Nausea And Vomiting    Review of Systems: Positive for allergy/sinus trouble, arthritis, fatigue, headaches. All other systems reviewed and negative except where noted in HPI.    Ct Abdomen Pelvis W Contrast  03/31/2015   CLINICAL DATA:  Epigastric pain the radiates the back. Abdominal distension  EXAM: CT ABDOMEN  AND PELVIS WITH CONTRAST  TECHNIQUE: Multidetector CT imaging of the abdomen and pelvis was performed using the standard protocol following bolus administration of intravenous contrast.  CONTRAST:  129mL OMNIPAQUE IOHEXOL 300 MG/ML  SOLN  COMPARISON:  CT 02/27/2015  FINDINGS: Lower chest: Lung bases are clear.  Hepatobiliary: Tiny hypodensities within liver unchanged. Coarse calcification the right hepatic lobe is unchanged. No worsened hepatic lesions. Gallbladder is normal. No biliary duct dilatation.  Pancreas: Pancreas is normal. No ductal dilatation. No pancreatic inflammation.  Spleen: Normal spleen  Adrenals/urinary tract: Adrenal glands and kidneys are normal. The ureters and bladder normal.  Stomach/Bowel: Stomach, small bowel, appendix, and cecum are normal. The colon and rectosigmoid colon are normal.  Vascular/Lymphatic: Abdominal aorta is normal caliber. There is no retroperitoneal or periportal lymphadenopathy. No pelvic lymphadenopathy.  Reproductive: Uterus and ovaries are normal.  Musculoskeletal: No aggressive osseous lesion.  Other: No free fluid.  IMPRESSION: 1. No acute findings in the abdomen pelvis. 2. No change from comparison exam.   Electronically Signed   By: Suzy Bouchard M.D.   On: 03/31/2015 23:08   US Abdomen Limited Ruq  03/31/2015   CLINICAL DATA:  54 year old female with epigastric pain for 1 day.  EXAM: US ABDOMEN LIMITED - RIGHT UPPER QUADRANT  COMPARISON:  No priors.  FINDINGS: Gallbladder:  No gallstones or wall thickening visualized. No sonographic Murphy sign noted.  Common bile duct:  Diameter: 3.4 mm in the porta hepatis.  Liver:  No focal lesion identified. Within normal limits in parenchymal echogenicity.  IMPRESSION: 1. No acute findings to account for the patient's symptoms. Specifically, no evidence of gallstones, and no findings to suggest an acute cholecystitis at this time.   Electronically Signed   By: Vinnie Langton M.D.   On: 03/31/2015 17:31    Physical  Exam: BP 130/74 mmHg  Pulse 76  Ht 5' 3.25" (1.607 m)  Wt 176 lb 4 oz (79.946 kg)  BMI 30.96 kg/m2 Constitutional: Pleasant,well-developed, white female in no acute distress. HEENT: Normocephalic and atraumatic. Conjunctivae are normal. No scleral icterus. Neck supple.  Cardiovascular: Normal rate, regular rhythm.  Pulmonary/chest: Effort normal and breath sounds normal. No wheezing, rales or rhonchi. Abdominal: Soft, nondistended, nontender. Bowel sounds active throughout. There are no masses palpable. No hepatomegaly. Rectal: Hemorrhoidal tissue from previous hemorrhoids. Light brown stool on rectal exam, Hemoccult negative. No rectal masses felt Extremities: no edema Lymphadenopathy: No cervical adenopathy noted. Neurological: Alert and oriented to person place and time. Skin: Skin is warm and dry. No rashes noted. Psychiatric: Normal mood and affect. Behavior is normal.   ASSESSMENT AND PLAN:   57. 54 year old female with one week history of painless rectal bleeding late March. Seen by PCP at the time, bleeding felt to be secondary to hemorrhoids. No further bleeding since late March. Patient reports a normal colonoscopy 6 years ago. She has no family history of colon cancer. We will  obtain colonoscopy report from [facility] to determine when repeat exam necessary.   2. Epigastric/ RUQ pain and early satiety. Ultrasound, CT scan, labs unrevealing. He has associated mild weight loss. No NSAID use since what sounds like esophageal ulcers 6 years ago. Mother has history of esophageal / gastric cancer and patient concerned about her own symptoms. For further evaluation patient will be scheduled for upper endoscopy. The benefits, risks, and potential complications of EGD with possible biopsies were discussed with the patient and she agrees to proceed.    CC: Deborra Medina, MD

## 2015-04-18 ENCOUNTER — Encounter: Payer: Self-pay | Admitting: Nurse Practitioner

## 2015-04-18 DIAGNOSIS — R6881 Early satiety: Secondary | ICD-10-CM | POA: Insufficient documentation

## 2015-04-18 DIAGNOSIS — R1011 Right upper quadrant pain: Secondary | ICD-10-CM | POA: Insufficient documentation

## 2015-05-02 NOTE — Progress Notes (Signed)
54 yo not 28  Agree with Ms. Guenther's assessment and plan. Nichole Mayer, MD, [facility]

## 2015-05-07 NOTE — Telephone Encounter (Signed)
A user error has taken place.

## 2015-05-13 ENCOUNTER — Ambulatory Visit (AMBULATORY_SURGERY_CENTER): Payer: 59 | Admitting: Internal Medicine

## 2015-05-13 ENCOUNTER — Encounter: Payer: Self-pay | Admitting: Internal Medicine

## 2015-05-13 VITALS — BP 113/64 | HR 73 | Temp 97.9°F | Resp 21 | Ht 63.5 in | Wt 176.0 lb

## 2015-05-13 DIAGNOSIS — K2900 Acute gastritis without bleeding: Secondary | ICD-10-CM

## 2015-05-13 DIAGNOSIS — R1011 Right upper quadrant pain: Secondary | ICD-10-CM | POA: Diagnosis present

## 2015-05-13 DIAGNOSIS — R6881 Early satiety: Secondary | ICD-10-CM

## 2015-05-13 DIAGNOSIS — K2960 Other gastritis without bleeding: Secondary | ICD-10-CM

## 2015-05-13 MED ORDER — SODIUM CHLORIDE 0.9 % IV SOLN: 500.0000 mL | INTRAVENOUS | Status: DC

## 2015-05-13 NOTE — Progress Notes (Signed)
Patient awakening,vss,report to rn 

## 2015-05-13 NOTE — Patient Instructions (Addendum)
I found some erosions or tiny ulcers in the stomach. Biopsies taken. Do not look like cancer.  You should take either omeprazole or pantoprazole but not both.  Will call with results and plans.  I appreciate the opportunity to care for you. Gatha Mayer, MD, FACG YOU HAD AN ENDOSCOPIC PROCEDURE TODAY AT [facility]:   Refer to the procedure report that was given to you for any specific questions about what was found during the examination.  If the procedure report does not answer your questions, please call your gastroenterologist to clarify.  If you requested that your care partner not be given the details of your procedure findings, then the procedure report has been included in a sealed envelope for you to review at your convenience later.  YOU SHOULD EXPECT: Some feelings of bloating in the abdomen. Passage of more gas than usual.  Walking can help get rid of the air that was put into your GI tract during the procedure and reduce the bloating. If you had a lower endoscopy (such as a colonoscopy or flexible sigmoidoscopy) you may notice spotting of blood in your stool or on the toilet paper. If you underwent a bowel prep for your procedure, you may not have a normal bowel movement for a few days.  Please Note:  You might notice some irritation and congestion in your nose or some drainage.  This is from the oxygen used during your procedure.  There is no need for concern and it should clear up in a day or so.  SYMPTOMS TO REPORT IMMEDIATELY:    Following upper endoscopy (EGD)  Vomiting of blood or coffee ground material  New chest pain or pain under the shoulder blades  Painful or persistently difficult swallowing  New shortness of breath  Fever of 100F or higher  Black, tarry-looking stools  For urgent or emergent issues, a gastroenterologist can be reached at any hour by calling [phone].   DIET: Your first meal following the procedure should be a  small meal and then it is ok to progress to your normal diet. Heavy or fried foods are harder to digest and may make you feel nauseous or bloated.  Likewise, meals heavy in dairy and vegetables can increase bloating.  Drink plenty of fluids but you should avoid alcoholic beverages for 24 hours.  ACTIVITY:  You should plan to take it easy for the rest of today and you should NOT DRIVE or use heavy machinery until tomorrow (because of the sedation medicines used during the test).    FOLLOW UP: Our staff will call the number listed on your records the next business day following your procedure to check on you and address any questions or concerns that you may have regarding the information given to you following your procedure. If we do not reach you, we will leave a message.  However, if you are feeling well and you are not experiencing any problems, there is no need to return our call.  We will assume that you have returned to your regular daily activities without incident.  If any biopsies were taken you will be contacted by phone or by letter within the next 1-3 weeks.  Please call us at [phone] if you have not heard about the biopsies in 3 weeks.    SIGNATURES/CONFIDENTIALITY: You and/or your care partner have signed paperwork which will be entered into your electronic medical record.  These signatures attest to the fact that that  the information above on your After Visit Summary has been reviewed and is understood.  Full responsibility of the confidentiality of this discharge information lies with you and/or your care-partner.

## 2015-05-13 NOTE — Op Note (Addendum)
[place]  [address] [place], [address]   ENDOSCOPY PROCEDURE REPORT  PATIENT: Nichole Riddle, Nichole Riddle  MR#: [id number] BIRTHDATE: 28-Dec-1960 , 3  yrs. old GENDER: female ENDOSCOPIST: Gatha Mayer, MD, [facility] PROCEDURE DATE:  05/13/2015 PROCEDURE:  EGD w/ biopsy ASA CLASS:     Class II INDICATIONS:  abdominal pain in the upper right quadrant, epigastric pain, and early satiety. MEDICATIONS: Propofol 200 mg IV and Monitored anesthesia care TOPICAL ANESTHETIC: none  DESCRIPTION OF PROCEDURE: After the risks benefits and alternatives of the procedure were thoroughly explained, informed consent was obtained.  The LB [id number] endoscope was introduced through the mouth and advanced to the second portion of the duodenum , Without limitations.  The instrument was slowly withdrawn as the mucosa was fully examined.    1) Multipe small superficial antral ersions - biopsied 2) Otherwise normal EGD.  Retroflexed views revealed no abnormalities.     The scope was then withdrawn from the patient and the procedure completed.  COMPLICATIONS: There were no immediate complications.  ENDOSCOPIC IMPRESSION: 1) Multipe small superficial antral ersions - biopsied 2) Otherwise normal EGD  RECOMMENDATIONS: 1.  Continue PPI 2.  Office will call with results 3.  Avoid NSAID's if using 4.  No stones on [place] so consider antispasmodic agent 5.  Still waiting on prior colonoscopy report   eSigned:  Gatha Mayer, MD, [facility] 05/13/2015 3:08 PM Revised: 05/13/2015 3:08 PM   CC: The Patient and Deborra Medina, MD

## 2015-05-13 NOTE — Progress Notes (Signed)
Called to room to assist during endoscopic procedure.  Patient ID and intended procedure confirmed with present staff. Received instructions for my participation in the procedure from the performing physician.  

## 2015-05-14 ENCOUNTER — Telehealth: Payer: Self-pay

## 2015-05-14 NOTE — Telephone Encounter (Signed)
Left a message at [phone] for the pt to call if and questions or concerns. maw

## 2015-05-15 ENCOUNTER — Telehealth: Payer: Self-pay | Admitting: Internal Medicine

## 2015-05-16 NOTE — Telephone Encounter (Signed)
Patient reports that Wed she had abdominal cramping and diarrhea.  She reports she feels much better this am.  She is advised very unlikely to be related to EGD.  She will call back if she has any additional questions or concerns.

## 2015-05-19 ENCOUNTER — Telehealth: Payer: Self-pay

## 2015-05-19 NOTE — Telephone Encounter (Signed)
LMTCB regarding scheduling a mammogram

## 2015-05-20 NOTE — Progress Notes (Signed)
Quick Note:  Biopsies ok Please get a sx update Am also still waiting on prior colonoscopy report requested by Nevin Bloodgood - have cced her  No letter or recall from [facility] ______

## 2015-05-21 ENCOUNTER — Encounter: Payer: Self-pay | Admitting: Internal Medicine

## 2015-05-21 ENCOUNTER — Other Ambulatory Visit: Payer: Self-pay | Admitting: *Deleted

## 2015-05-21 ENCOUNTER — Other Ambulatory Visit: Payer: Self-pay

## 2015-05-21 DIAGNOSIS — R197 Diarrhea, unspecified: Secondary | ICD-10-CM

## 2015-05-21 NOTE — Progress Notes (Signed)
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

## 2015-05-22 ENCOUNTER — Telehealth: Payer: Self-pay | Admitting: Internal Medicine

## 2015-05-22 NOTE — Telephone Encounter (Signed)
Spoke with patient, she has already signed a records release.  Confirmed with patient that it was [facility] , Dr. Su Ley that did her procedure.  Spoke with Langley Gauss from that office and she will send colon report ASAP.

## 2015-05-23 NOTE — Progress Notes (Signed)
Quick Note:  I think I have them but have not looked at them  ______

## 2015-05-26 ENCOUNTER — Other Ambulatory Visit: Payer: 59

## 2015-05-26 DIAGNOSIS — R197 Diarrhea, unspecified: Secondary | ICD-10-CM

## 2015-05-27 LAB — FECAL LACTOFERRIN, QUANT: LACTOFERRIN: NEGATIVE

## 2015-05-27 LAB — CLOSTRIDIUM DIFFICILE BY PCR: Toxigenic C. Difficile by PCR: NOT DETECTED

## 2015-05-28 ENCOUNTER — Telehealth: Payer: Self-pay | Admitting: Internal Medicine

## 2015-05-28 NOTE — Telephone Encounter (Signed)
Dr. Carlean Purl patient calling for results.  Please advise next step. Lactoferrin and C-diff negative

## 2015-05-28 NOTE — Telephone Encounter (Signed)
Colonoscopy in 2010 was ok but since still having loose stools/diarrhea and has hx rectal bleeding recommend a colonoscopy

## 2015-05-28 NOTE — Telephone Encounter (Signed)
Patient advised She is scheduled for pre-visit for 05/30/15 1:30 and colon 06/11/15

## 2015-05-29 ENCOUNTER — Telehealth: Payer: Self-pay | Admitting: Internal Medicine

## 2015-05-29 DIAGNOSIS — Z1239 Encounter for other screening for malignant neoplasm of breast: Secondary | ICD-10-CM

## 2015-05-29 NOTE — Telephone Encounter (Signed)
Order in for routine mammo.

## 2015-05-29 NOTE — Telephone Encounter (Signed)
Received mychart about scheduling a mammogram. No order in the system/msn

## 2015-05-30 ENCOUNTER — Ambulatory Visit (INDEPENDENT_AMBULATORY_CARE_PROVIDER_SITE_OTHER): Payer: 59 | Admitting: Nurse Practitioner

## 2015-05-30 ENCOUNTER — Other Ambulatory Visit: Payer: Self-pay | Admitting: Internal Medicine

## 2015-05-30 ENCOUNTER — Ambulatory Visit (AMBULATORY_SURGERY_CENTER): Payer: Self-pay

## 2015-05-30 VITALS — Ht 63.0 in | Wt 171.4 lb

## 2015-05-30 VITALS — BP 106/62 | HR 81 | Temp 98.5°F | Resp 14 | Ht 64.0 in | Wt 171.6 lb

## 2015-05-30 DIAGNOSIS — G4700 Insomnia, unspecified: Secondary | ICD-10-CM | POA: Diagnosis not present

## 2015-05-30 DIAGNOSIS — N949 Unspecified condition associated with female genital organs and menstrual cycle: Secondary | ICD-10-CM | POA: Diagnosis not present

## 2015-05-30 DIAGNOSIS — N898 Other specified noninflammatory disorders of vagina: Secondary | ICD-10-CM | POA: Diagnosis not present

## 2015-05-30 DIAGNOSIS — R197 Diarrhea, unspecified: Secondary | ICD-10-CM

## 2015-05-30 LAB — POCT URINALYSIS DIPSTICK
BILIRUBIN UA: NEGATIVE
Glucose, UA: NEGATIVE
Ketones, UA: NEGATIVE
NITRITE UA: NEGATIVE
PH UA: 6
Protein, UA: NEGATIVE
RBC UA: NEGATIVE
Spec Grav, UA: 1.02
Urobilinogen, UA: 0.2

## 2015-05-30 MED ORDER — ALPRAZOLAM 0.5 MG PO TABS: 0.5000 mg | ORAL_TABLET | Freq: Every evening | ORAL | Status: DC | PRN

## 2015-05-30 NOTE — Progress Notes (Signed)
No allergies to eggs or soy No diet/weight loss meds No home oxygen No past problems with anesthesia  Has email  Emmi instructions given for colonoscopy 

## 2015-05-30 NOTE — Progress Notes (Signed)
   Subjective:    Patient ID: Nichole Riddle, female    DOB: Apr 22, 1961, 54 y.o.   MRN: [id number]  HPI  Nichole Riddle is a 54 yo female with a CC burning and low back pain x 1 week.   1) Burning/itching  OTC medication urocalm- no help   2) Refill of Xanax- unable to sleep   Review of Systems  Constitutional: Negative for fever, chills, diaphoresis and fatigue.  Respiratory: Negative for chest tightness, shortness of breath and wheezing.   Cardiovascular: Negative for chest pain, palpitations and leg swelling.  Gastrointestinal: Negative for nausea, vomiting and diarrhea.  Genitourinary: Negative for decreased urine volume, vaginal bleeding, vaginal discharge and vaginal pain.       Vaginal burning/itching  Skin: Negative for rash.  Neurological: Negative for dizziness, weakness, numbness and headaches.  Psychiatric/Behavioral: Positive for sleep disturbance. The patient is not nervous/anxious.       Objective:   Physical Exam  Constitutional: She is oriented to person, place, and time. She appears well-developed and well-nourished. No distress.  BP 106/62 mmHg  Pulse 81  Temp(Src) 98.5 F (36.9 C)  Resp 14  Ht 5\' 4"  (1.626 m)  Wt 171 lb 9.6 oz (77.837 kg)  BMI 29.44 kg/m2  SpO2 95%   HENT:  Head: Normocephalic and atraumatic.  Right Ear: External ear normal.  Left Ear: External ear normal.  Cardiovascular: Normal rate and regular rhythm.   Pulmonary/Chest: Effort normal and breath sounds normal. No respiratory distress. She has no wheezes. She has no rales. She exhibits no tenderness.  Genitourinary: Vagina normal. No vaginal discharge found.  Neurological: She is alert and oriented to person, place, and time. No cranial nerve deficit. She exhibits normal muscle tone. Coordination normal.  Skin: Skin is warm and dry. No rash noted. She is not diaphoretic.  Psychiatric: She has a normal mood and affect. Her behavior is normal. Judgment and thought content normal.        Assessment & Plan:

## 2015-05-30 NOTE — Progress Notes (Signed)
Pre visit review using our clinic review tool, if applicable. No additional management support is needed unless otherwise documented below in the visit note. 

## 2015-05-30 NOTE — Patient Instructions (Signed)
We will contact you about your results of the wet prep.

## 2015-06-01 LAB — WET PREP, GENITAL

## 2015-06-02 LAB — URINE CULTURE

## 2015-06-03 ENCOUNTER — Other Ambulatory Visit: Payer: Self-pay | Admitting: Nurse Practitioner

## 2015-06-03 ENCOUNTER — Encounter: Payer: 59 | Admitting: Internal Medicine

## 2015-06-03 LAB — WET PREP BY MOLECULAR PROBE
Candida species: NEGATIVE
Gardnerella vaginalis: POSITIVE — AB
Trichomonas vaginosis: NEGATIVE

## 2015-06-03 MED ORDER — CIPROFLOXACIN HCL 250 MG PO TABS: 250.0000 mg | ORAL_TABLET | Freq: Two times a day (BID) | ORAL | Status: DC

## 2015-06-04 ENCOUNTER — Ambulatory Visit: Payer: PRIVATE HEALTH INSURANCE | Admitting: Internal Medicine

## 2015-06-05 ENCOUNTER — Encounter: Payer: Self-pay | Admitting: Internal Medicine

## 2015-06-06 ENCOUNTER — Other Ambulatory Visit: Payer: Self-pay | Admitting: Nurse Practitioner

## 2015-06-06 MED ORDER — METRONIDAZOLE 500 MG PO TABS: 500.0000 mg | ORAL_TABLET | Freq: Two times a day (BID) | ORAL | Status: DC

## 2015-06-11 ENCOUNTER — Encounter: Payer: Self-pay | Admitting: Internal Medicine

## 2015-06-11 ENCOUNTER — Ambulatory Visit (AMBULATORY_SURGERY_CENTER): Payer: 59 | Admitting: Internal Medicine

## 2015-06-11 VITALS — BP 117/74 | HR 66 | Temp 96.8°F | Resp 16 | Ht 63.0 in | Wt 171.0 lb

## 2015-06-11 DIAGNOSIS — D122 Benign neoplasm of ascending colon: Secondary | ICD-10-CM | POA: Diagnosis not present

## 2015-06-11 DIAGNOSIS — R197 Diarrhea, unspecified: Secondary | ICD-10-CM

## 2015-06-11 MED ORDER — SODIUM CHLORIDE 0.9 % IV SOLN: 500.0000 mL | INTRAVENOUS | Status: DC

## 2015-06-11 NOTE — Op Note (Signed)
[place]  [address]. [place], [address]   COLONOSCOPY PROCEDURE REPORT  PATIENT: Nichole Riddle, Nichole Riddle  MR#: [id number] BIRTHDATE: Apr 08, 1961 , 70  yrs. old GENDER: female ENDOSCOPIST: Gatha Mayer, MD, [facility] PROCEDURE DATE:  06/11/2015 PROCEDURE:   Colonoscopy, diagnostic and Colonoscopy with biopsy First Screening Colonoscopy - Avg.  risk and is 50 yrs.  old or older - No.  Prior Negative Screening - Now for repeat screening. N/A  History of Adenoma - Now for follow-up colonoscopy & has been > or = to 3 yrs.  N/A  Polyps removed today? Yes ASA CLASS:   Class II INDICATIONS:Clinically significant diarrhea of unexplained origin and Patient is not applicable for Colorectal Neoplasm Risk Assessment for this procedure. MEDICATIONS: Propofol 300 mg IV and Monitored anesthesia care  DESCRIPTION OF PROCEDURE:   After the risks benefits and alternatives of the procedure were thoroughly explained, informed consent was obtained.  The digital rectal exam revealed no abnormalities of the rectum.   The LB PCF Q180 [id number]  endoscope was introduced through the anus and advanced to the terminal ileum which was intubated for a short distance. No adverse events experienced.   The quality of the prep was excellent.  (MiraLax was used)  The instrument was then slowly withdrawn as the colon was fully examined. Estimated blood loss is zero unless otherwise noted in this procedure report.   COLON FINDINGS: A sessile polyp measuring 2 mm in size was found in the ascending colon.  A polypectomy was performed with cold forceps.  The resection was complete, the polyp tissue was completely retrieved and sent to histology.   The colonic mucosa appeared normal.  Multiple random biopsies were performed using cold forceps.  Samples were sent to R/O microscopic colitis. Retroflexed views revealed no abnormalities. The time to cecum = 4.6 Withdrawal time = 7.8   The scope was withdrawn and  the procedure completed. COMPLICATIONS: There were no immediate complications.  ENDOSCOPIC IMPRESSION: 1.   Sessile polyp was found in the ascending colon; polypectomy was performed with cold forceps 2.   The colonic mucosa appeared normal; multiple random biopsies were performed using cold forceps  RECOMMENDATIONS: 1.  Timing of repeat colonoscopy will be determined by pathology findings. 2.  Office will call with the results.  If no microscopic colitis then dx most likely IBS  eSigned:  Gatha Mayer, MD, [facility] 06/11/2015 10:46 AM   cc: The Patient

## 2015-06-11 NOTE — Progress Notes (Signed)
A/ox3, pleased with MAC, report to RN 

## 2015-06-11 NOTE — Progress Notes (Signed)
Called to room to assist during endoscopic procedure.  Patient ID and intended procedure confirmed with present staff. Received instructions for my participation in the procedure from the performing physician.  

## 2015-06-11 NOTE — Patient Instructions (Addendum)
The colonoscopy looked normal except for one tiny polyp that I removed and it is benign (will prove it). I took biopsies to see if there is microscopic inflammation (colitis). It is likely that you have Irritable Bowel Syndrome, however.  We will call with results and plans.  I appreciate the opportunity to care for you. Gatha Mayer, MD, FACG  YOU HAD AN ENDOSCOPIC PROCEDURE TODAY AT [facility]:   Refer to the procedure report that was given to you for any specific questions about what was found during the examination.  If the procedure report does not answer your questions, please call your gastroenterologist to clarify.  If you requested that your care partner not be given the details of your procedure findings, then the procedure report has been included in a sealed envelope for you to review at your convenience later.  YOU SHOULD EXPECT: Some feelings of bloating in the abdomen. Passage of more gas than usual.  Walking can help get rid of the air that was put into your GI tract during the procedure and reduce the bloating. If you had a lower endoscopy (such as a colonoscopy or flexible sigmoidoscopy) you may notice spotting of blood in your stool or on the toilet paper. If you underwent a bowel prep for your procedure, you may not have a normal bowel movement for a few days.  Please Note:  You might notice some irritation and congestion in your nose or some drainage.  This is from the oxygen used during your procedure.  There is no need for concern and it should clear up in a day or so.  SYMPTOMS TO REPORT IMMEDIATELY:   Following lower endoscopy (colonoscopy or flexible sigmoidoscopy):  Excessive amounts of blood in the stool  Significant tenderness or worsening of abdominal pains  Swelling of the abdomen that is new, acute  Fever of 100F or higher  For urgent or emergent issues, a gastroenterologist can be reached at any hour by calling [phone]  [phone].   DIET: Your first meal following the procedure should be a small meal and then it is ok to progress to your normal diet. Heavy or fried foods are harder to digest and may make you feel nauseous or bloated.  Likewise, meals heavy in dairy and vegetables can increase bloating.  Drink plenty of fluids but you should avoid alcoholic beverages for 24 hours.  ACTIVITY:  You should plan to take it easy for the rest of today and you should NOT DRIVE or use heavy machinery until tomorrow (because of the sedation medicines used during the test).    FOLLOW UP: Our staff will call the number listed on your records the next business day following your procedure to check on you and address any questions or concerns that you may have regarding the information given to you following your procedure. If we do not reach you, we will leave a message.  However, if you are feeling well and you are not experiencing any problems, there is no need to return our call.  We will assume that you have returned to your regular daily activities without incident.  If any biopsies were taken you will be contacted by phone or by letter within the next 1-3 weeks.  Please call us at [phone] if you have not heard about the biopsies in 3 weeks.    SIGNATURES/CONFIDENTIALITY: You and/or your care partner have signed paperwork which will be entered into your electronic medical record.  These signatures attest to the fact that that the information above on your After Visit Summary has been reviewed and is understood.  Full responsibility of the confidentiality of this discharge information lies with you and/or your care-partner.  Polyps-handout given

## 2015-06-12 ENCOUNTER — Encounter: Payer: Self-pay | Admitting: Nurse Practitioner

## 2015-06-12 ENCOUNTER — Telehealth: Payer: Self-pay | Admitting: *Deleted

## 2015-06-12 DIAGNOSIS — G4700 Insomnia, unspecified: Secondary | ICD-10-CM | POA: Insufficient documentation

## 2015-06-12 DIAGNOSIS — N898 Other specified noninflammatory disorders of vagina: Secondary | ICD-10-CM | POA: Insufficient documentation

## 2015-06-12 NOTE — Assessment & Plan Note (Signed)
Xanax prescription sent by fax. Compliant on [place]

## 2015-06-12 NOTE — Telephone Encounter (Signed)
  Follow up Call-  Call back number 06/11/2015 05/13/2015  Post procedure Call Back phone  # [phone]  Permission to leave phone message Yes Yes     Message that this is not a working number

## 2015-06-12 NOTE — Assessment & Plan Note (Signed)
Wet prep performed. POCT urine will treat with cipro empirically and get culture

## 2015-06-17 ENCOUNTER — Other Ambulatory Visit: Payer: Self-pay | Admitting: Internal Medicine

## 2015-06-17 ENCOUNTER — Encounter: Payer: Self-pay | Admitting: Internal Medicine

## 2015-06-17 DIAGNOSIS — K589 Irritable bowel syndrome without diarrhea: Secondary | ICD-10-CM | POA: Insufficient documentation

## 2015-06-17 HISTORY — DX: Irritable bowel syndrome, unspecified: K58.9

## 2015-06-17 NOTE — Progress Notes (Signed)
Quick Note:  Biopsies are normal - polyp not really a polyp - lymphoid follicle My dx is IBS - diarrhea predominant rec start lotronex 0.5 mg bid and stop if she gets pain and constipation See me in 6-8 weeks If she is not better aftyer 2-3 weeks call me back  [facility]C - no recall and no letter ______

## 2015-06-18 ENCOUNTER — Encounter: Payer: Self-pay | Admitting: Internal Medicine

## 2015-06-18 NOTE — Telephone Encounter (Signed)
Please advise 

## 2015-06-20 ENCOUNTER — Ambulatory Visit: Payer: 59 | Attending: Internal Medicine

## 2015-06-20 ENCOUNTER — Other Ambulatory Visit: Payer: Self-pay

## 2015-06-20 MED ORDER — ALOSETRON HCL 0.5 MG PO TABS: 0.5000 mg | ORAL_TABLET | Freq: Two times a day (BID) | ORAL | Status: DC

## 2015-06-24 IMAGING — CT CT ABD-PELV W/ CM
2 of 5 series · 17 of 46 positions shown, 19 images · IV contrast (omnipaque)
Comparison: Ultrasound 02/03/2011

CLINICAL DATA: Back and pelvic pain with bloating for 2 weeks.
Blood in stool since last [REDACTED].

EXAM:
CT ABDOMEN AND PELVIS WITH CONTRAST
TECHNIQUE: Multidetector CT imaging of the abdomen and pelvis was performed
using the standard protocol following bolus administration of
intravenous contrast.
CONTRAST:  100 cc Omnipaque 300 IV.

[Series 2: routine abd pel with · axial · 0.69mm/px · z∈[-750,-335]mm · 14 of 93 slices shown, 16 images]
[im 5/93  soft-tissue]
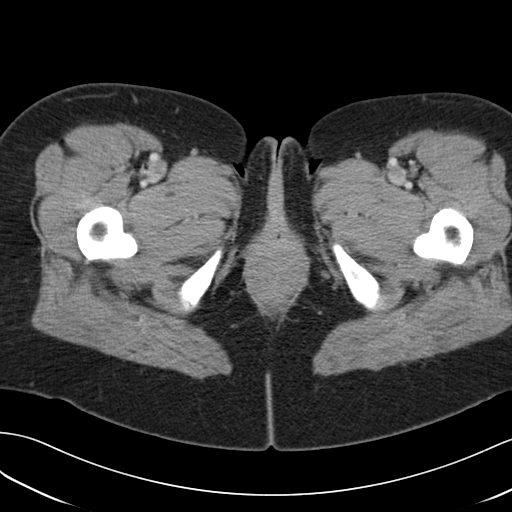
[im 5/93  bone]
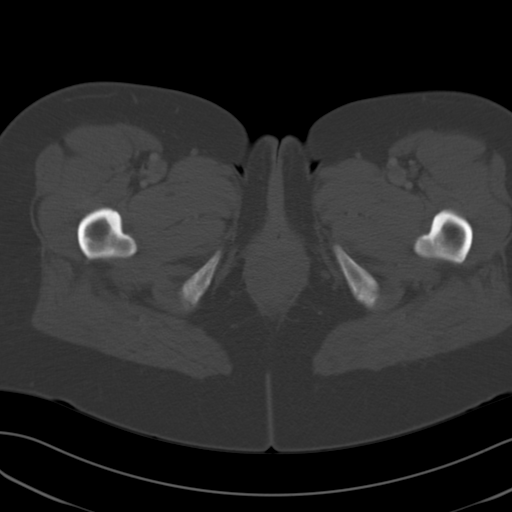
[im 10/93  soft-tissue]
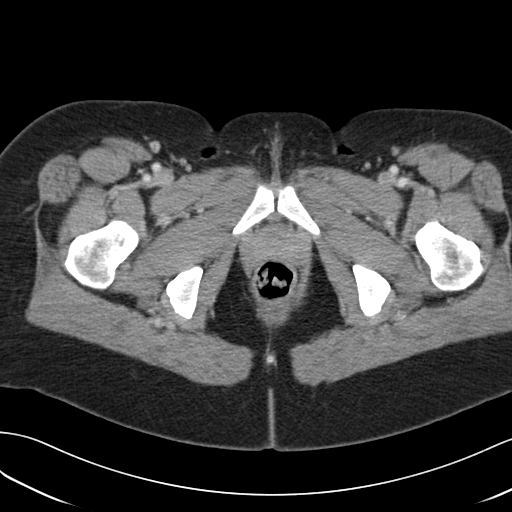
[im 20/93  soft-tissue]
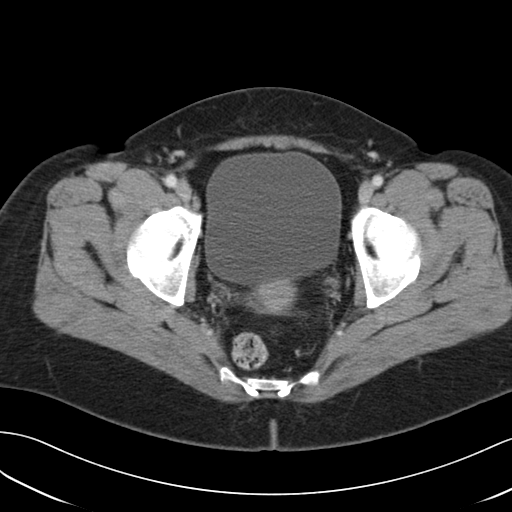
[im 25/93  soft-tissue]
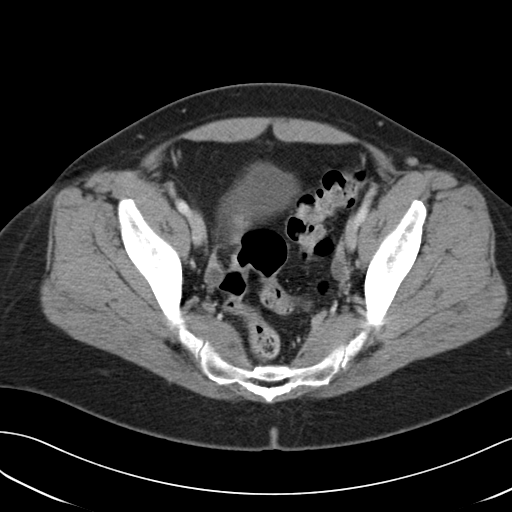
[im 30/93  soft-tissue]
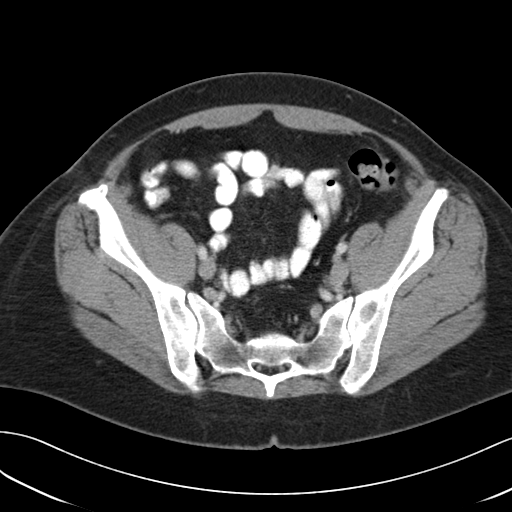
[im 39/93  soft-tissue]
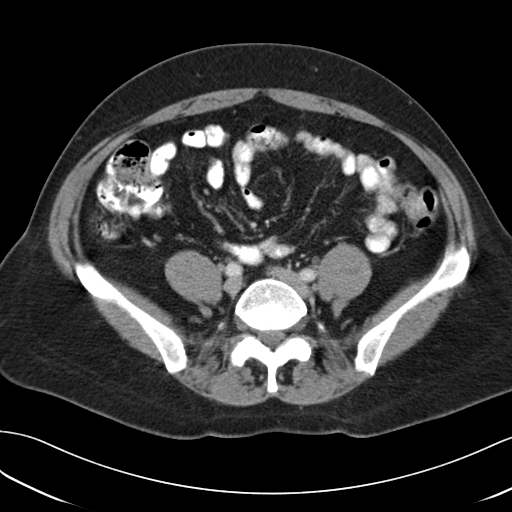
[im 44/93  soft-tissue]
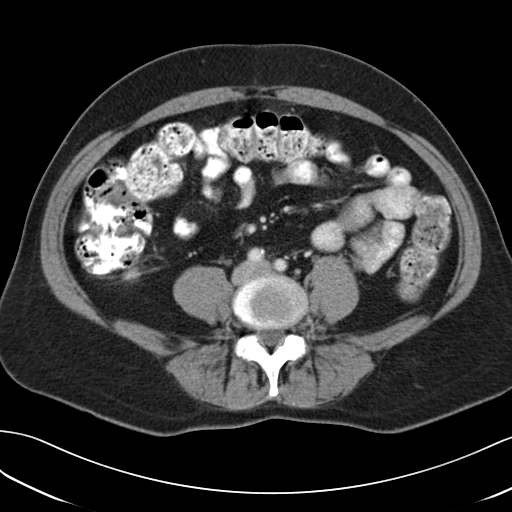
[im 49/93  soft-tissue]
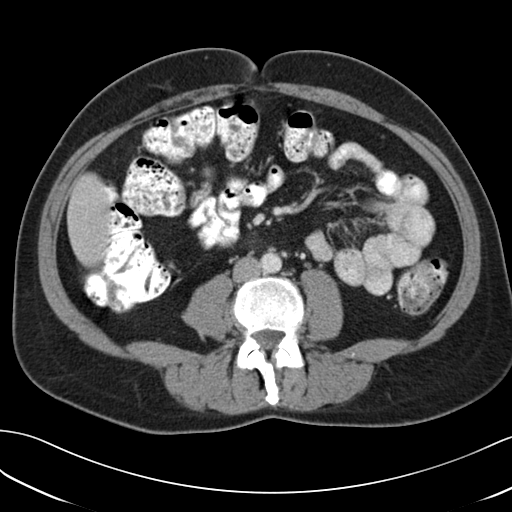
[im 54/93  soft-tissue]
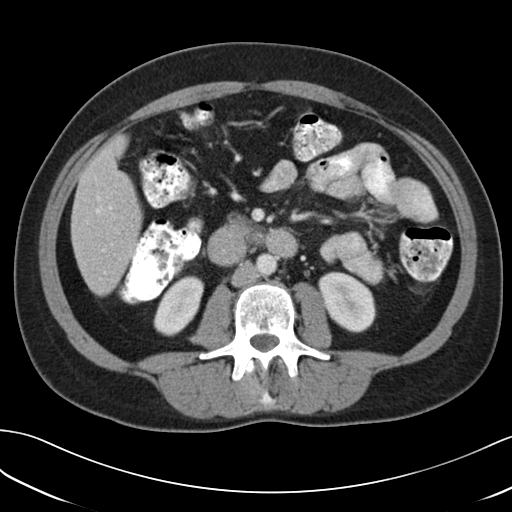
[im 54/93  bone]
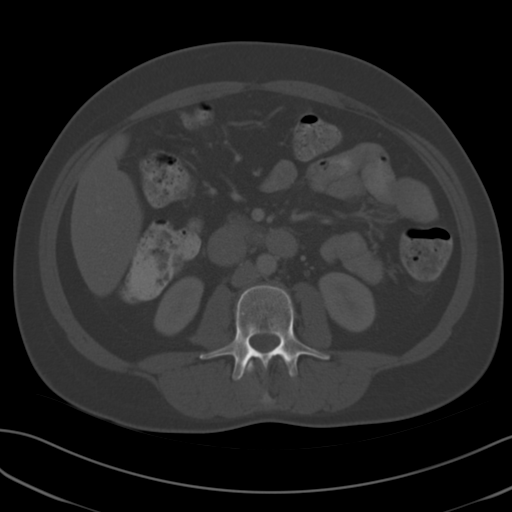
[im 63/93  soft-tissue]
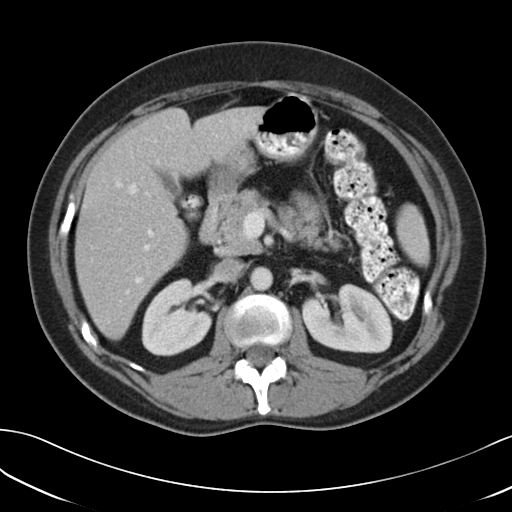
[im 68/93  soft-tissue]
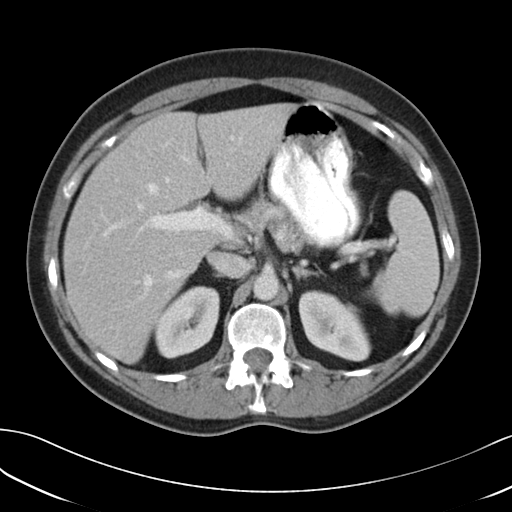
[im 73/93  soft-tissue]
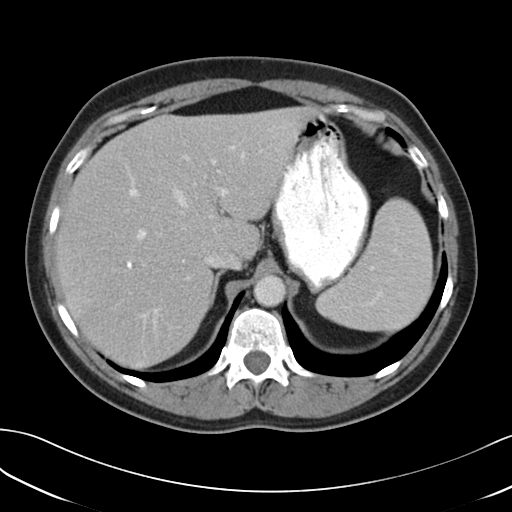
[im 83/93  soft-tissue]
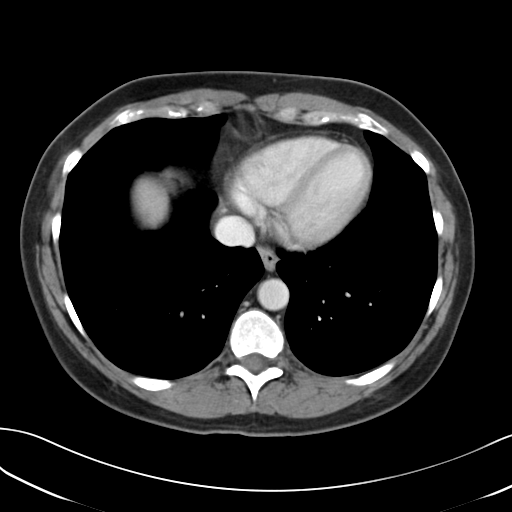
[im 88/93  soft-tissue]
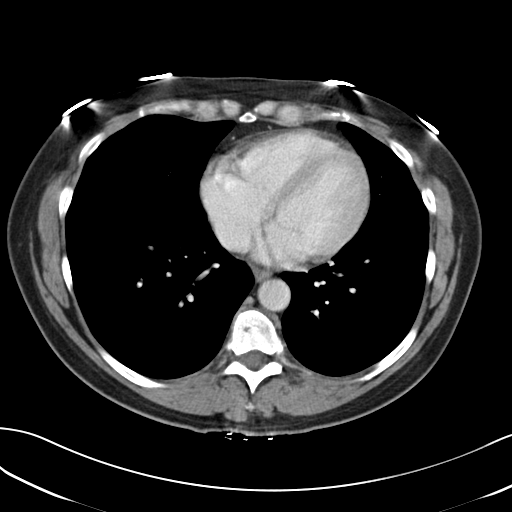

[Series 6: cor routine abd pel with · coronal · 0.78mm/px · 3 of 141 slices shown]
[im 47/141  soft-tissue]
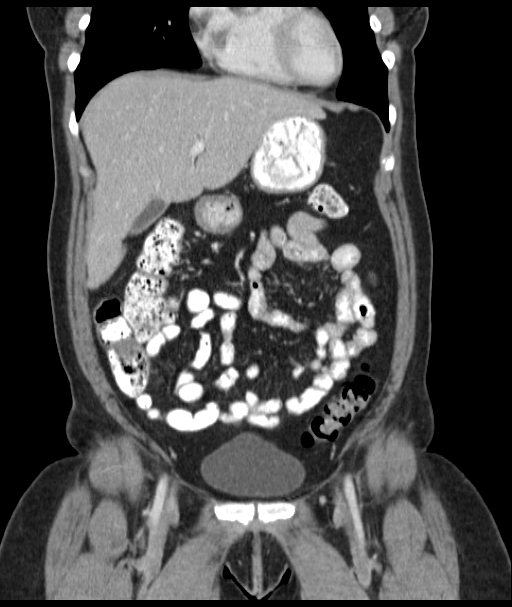
[im 63/141  soft-tissue]
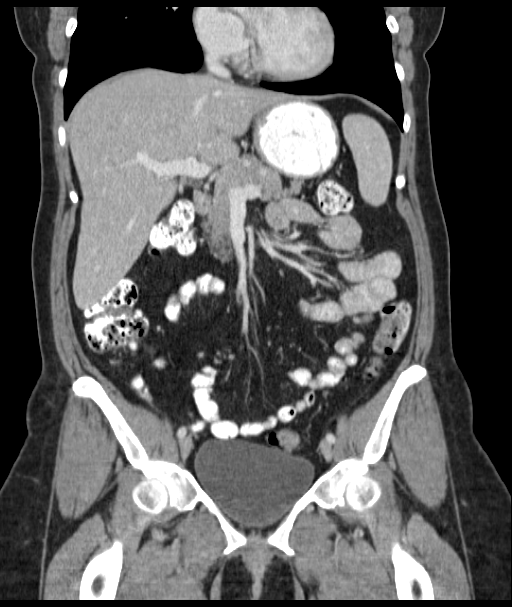
[im 78/141  soft-tissue]
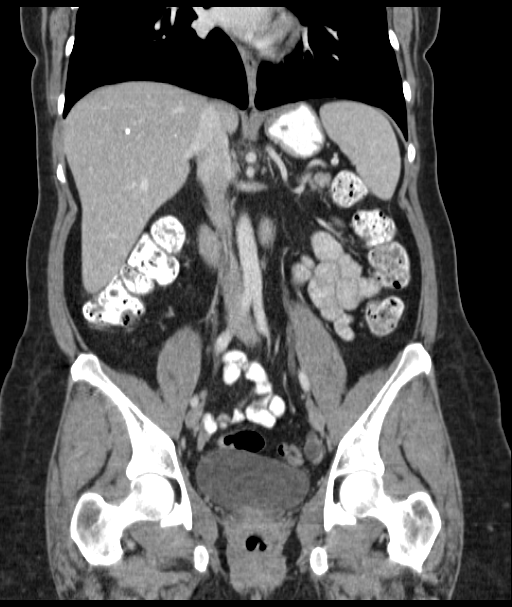

[17 of 46 positions shown; findings below may reference images not displayed]

FINDINGS: Lower chest: Lung bases are clear. No effusions. Heart is normal
size.

Hepatobiliary: No biliary ductal dilatation or focal hepatic lesion.
Gallbladder grossly unremarkable.

Pancreas: No focal abnormality or ductal dilatation.

Spleen: No focal abnormality.  Normal size.

Adrenals/Urinary Tract: No focal renal or adrenal abnormality. No
hydronephrosis. Urinary bladder is unremarkable.

Stomach/Bowel: Stomach, large and small bowel grossly unremarkable.
Appendix is visualized and unremarkable.

Vascular/Lymphatic: No retroperitoneal or mesenteric adenopathy.
Aorta normal caliber.

Reproductive: No mass or other significant abnormality.

Other: No free fluid or free air.

Musculoskeletal: No focal bone lesion or acute bony abnormality.
IMPRESSION: Unremarkable study.  No acute findings or significant abnormality.

## 2015-06-30 ENCOUNTER — Ambulatory Visit: Payer: 59 | Attending: Internal Medicine

## 2015-07-03 ENCOUNTER — Telehealth: Payer: Self-pay | Admitting: *Deleted

## 2015-07-03 ENCOUNTER — Other Ambulatory Visit (INDEPENDENT_AMBULATORY_CARE_PROVIDER_SITE_OTHER): Payer: 59

## 2015-07-03 DIAGNOSIS — N898 Other specified noninflammatory disorders of vagina: Secondary | ICD-10-CM

## 2015-07-03 DIAGNOSIS — R300 Dysuria: Secondary | ICD-10-CM

## 2015-07-03 LAB — URINALYSIS, ROUTINE W REFLEX MICROSCOPIC
Bilirubin Urine: NEGATIVE
Hgb urine dipstick: NEGATIVE
Ketones, ur: NEGATIVE
LEUKOCYTES UA: NEGATIVE
Nitrite: NEGATIVE
RBC / HPF: NONE SEEN (ref 0–?)
SPECIFIC GRAVITY, URINE: 1.01 (ref 1.000–1.030)
Total Protein, Urine: NEGATIVE
UROBILINOGEN UA: 0.2 (ref 0.0–1.0)
Urine Glucose: NEGATIVE
pH: 6 (ref 5.0–8.0)

## 2015-07-03 MED ORDER — CLOBETASOL PROPIONATE 0.05 % EX CREA: 1.0000 "application " | TOPICAL_CREAM | Freq: Two times a day (BID) | CUTANEOUS | Status: DC

## 2015-07-03 NOTE — Telephone Encounter (Signed)
I will not presscribe antibiotics unless a repeat UA and culture confirms continued infection because the cuase may just be vaginal irritation. If the culture is negative and she is not having discharge,  Then I ill call in a  steroid ointment

## 2015-07-03 NOTE — Telephone Encounter (Signed)
Ok, I will add the ua and culture since it has not been ordered.  I will send her sterid cream to her pharmacy,  It will not hurt to try it while we await the culture results.

## 2015-07-03 NOTE — Telephone Encounter (Signed)
Spoke with the patient.  States that the symptoms all went away post both the Cipro and Flagyl administrations.  States that her urine now is strong in odor, burning sensation and NO discharge.  She is unavailable to come for an appointment, but will drop off a specimen.  She verbalized understanding that it might be a day or two to get culture results.

## 2015-07-03 NOTE — Telephone Encounter (Signed)
Patient was treated by Lorane Gell, for UTI, patient stated that condition had subsided for a few days,however, she now has a burning sensation with itching. Patient is requesting another antibiotic to be called in to pharmacy.

## 2015-07-03 NOTE — Telephone Encounter (Signed)
Last OV on 05/30/15 with C. Doss, NP  prescribed Cipro for UTI, then on 06/06/15 prescribed Flagyl for 7days for BV.  Please advise?

## 2015-07-04 LAB — URINALYSIS, DIPSTICK ONLY
BILIRUBIN UA: NEGATIVE
GLUCOSE, UA: NEGATIVE
Ketones, UA: NEGATIVE
NITRITE UA: NEGATIVE
PROTEIN UA: NEGATIVE
RBC, UA: NEGATIVE
Specific Gravity, UA: 1.009 (ref 1.005–1.030)
Urobilinogen, Ur: 0.2 mg/dL (ref 0.2–1.0)
pH, UA: 5.5 (ref 5.0–7.5)

## 2015-07-04 LAB — URINE CULTURE

## 2015-07-06 ENCOUNTER — Encounter: Payer: Self-pay | Admitting: Internal Medicine

## 2015-07-09 ENCOUNTER — Ambulatory Visit (INDEPENDENT_AMBULATORY_CARE_PROVIDER_SITE_OTHER): Payer: 59 | Admitting: Internal Medicine

## 2015-07-09 ENCOUNTER — Ambulatory Visit: Payer: 59 | Admitting: Internal Medicine

## 2015-07-09 ENCOUNTER — Telehealth: Payer: Self-pay | Admitting: Internal Medicine

## 2015-07-09 DIAGNOSIS — N898 Other specified noninflammatory disorders of vagina: Secondary | ICD-10-CM

## 2015-07-09 NOTE — Telephone Encounter (Signed)
Pt called an cancelled appt at 10 am due to family emergency.. Please advise if I can cancel.[name]

## 2015-07-09 NOTE — Telephone Encounter (Signed)
c 

## 2015-07-12 NOTE — Assessment & Plan Note (Signed)
Patient failed to keep scheduled appointment and will be charged a no show fee.   

## 2015-07-12 NOTE — Progress Notes (Signed)
Patient failed to keep scheduled appointment and will be charged a no show fee.   

## 2015-08-19 ENCOUNTER — Ambulatory Visit: Payer: 59 | Admitting: Internal Medicine

## 2015-08-22 ENCOUNTER — Encounter: Payer: Self-pay | Admitting: Internal Medicine

## 2015-08-22 ENCOUNTER — Ambulatory Visit (INDEPENDENT_AMBULATORY_CARE_PROVIDER_SITE_OTHER): Payer: 59 | Admitting: Internal Medicine

## 2015-08-22 VITALS — BP 118/72 | HR 65 | Temp 98.4°F | Resp 12 | Ht 63.0 in | Wt 166.1 lb

## 2015-08-22 DIAGNOSIS — G4700 Insomnia, unspecified: Secondary | ICD-10-CM

## 2015-08-22 DIAGNOSIS — Z8711 Personal history of peptic ulcer disease: Secondary | ICD-10-CM

## 2015-08-22 DIAGNOSIS — E669 Obesity, unspecified: Secondary | ICD-10-CM | POA: Diagnosis not present

## 2015-08-22 DIAGNOSIS — Z8719 Personal history of other diseases of the digestive system: Secondary | ICD-10-CM | POA: Diagnosis not present

## 2015-08-22 MED ORDER — TEMAZEPAM 15 MG PO CAPS: 15.0000 mg | ORAL_CAPSULE | Freq: Every evening | ORAL | Status: DC | PRN

## 2015-08-22 MED ORDER — OMEPRAZOLE 40 MG PO CPDR: DELAYED_RELEASE_CAPSULE | ORAL | Status: DC

## 2015-08-22 MED ORDER — HYOSCYAMINE SULFATE ER 0.375 MG PO TB12: 0.3750 mg | ORAL_TABLET | Freq: Two times a day (BID) | ORAL | Status: DC

## 2015-08-22 MED ORDER — RIZATRIPTAN BENZOATE 10 MG PO TBDP: ORAL_TABLET | ORAL | Status: DC

## 2015-08-22 NOTE — Patient Instructions (Addendum)
Resume omeprazole 40 mg in the am before any food,  And start levsin twice daily for IBS. Follow the dietary advice below   Start temazepam 15 mg one hour before bedtime,  May increase to 30 mg if needed    Diet and Irritable Bowel Syndrome  No cure has been found for irritable bowel syndrome (IBS). Many options are available to treat the symptoms. Your caregiver will give you the best treatments available for your symptoms. He or she will also encourage you to manage stress and to make changes to your diet. You need to work with your caregiver and Registered Dietician to find the best combination of medicine, diet, counseling, and support to control your symptoms. The following are some diet suggestions. FOODS THAT MAKE IBS WORSE  Fatty foods, such as [place] fries.  Milk products, such as cheese or ice cream.  Chocolate.  Alcohol.  Caffeine (found in coffee and some sodas).  Carbonated drinks, such as soda. If certain foods cause symptoms, you should eat less of them or stop eating them. FOOD JOURNAL   Keep a journal of the foods that seem to cause distress. Write down:  What you are eating during the day and when.  What problems you are having after eating.  When the symptoms occur in relation to your meals.  What foods always make you feel badly.  Take your notes with you to your caregiver to see if you should stop eating certain foods. FOODS THAT MAKE IBS BETTER Fiber reduces IBS symptoms, especially constipation, because it makes stools soft, bulky, and easier to pass. Fiber is found in bran, bread, cereal, beans, fruit, and vegetables. Examples of foods with fiber include:  Apples.  Peaches.  Pears.  Berries.  Figs.  Broccoli, raw.  Cabbage.  Carrots.  Raw peas.  Kidney beans.  Lima beans.  Whole-grain bread.  Whole-grain cereal. Add foods with fiber to your diet a little at a time. This will let your body get used to them. Too much fiber at once  might cause gas and swelling of your abdomen. This can trigger symptoms in a person with IBS. Caregivers usually recommend a diet with enough fiber to produce soft, painless bowel movements. High fiber diets may cause gas and bloating. However, these symptoms often go away within a few weeks, as your body adjusts. In many cases, dietary fiber may lessen IBS symptoms, particularly constipation. However, it may not help pain or diarrhea. High fiber diets keep the colon mildly enlarged (distended) with the added fiber. This may help prevent spasms in the colon. Some forms of fiber also keep water in the stool, thereby preventing hard stools that are difficult to pass.  Besides telling you to eat more foods with fiber, your caregiver may also tell you to get more fiber by taking a fiber pill or drinking water mixed with a special high fiber powder. An example of this is a natural fiber laxative containing psyllium seed.  TIPS  Large meals can cause cramping and diarrhea in people with IBS. If this happens to you, try eating 4 or 5 small meals a day, or try eating less at each of your usual 3 meals. It may also help if your meals are low in fat and high in carbohydrates. Examples of carbohydrates are pasta, rice, whole-grain breads and cereals, fruits, and vegetables.  If dairy products cause your symptoms to flare up, you can try eating less of those foods. You might be able to handle  yogurt better than other dairy products, because it contains bacteria that helps with digestion. Dairy products are an important source of calcium and other nutrients. If you need to avoid dairy products, be sure to talk with a Registered Dietitian about getting these nutrients through other food sources.  Drink enough water and fluids to keep your urine clear or pale yellow. This is important, especially if you have diarrhea. FOR MORE INFORMATION  International Foundation for Functional Gastrointestinal Disorders: www.iffgd.org   National Digestive Diseases Information Clearinghouse: digestive.AmenCredit.is Document Released: 02/05/2004 Document Revised: 02/07/2012 Document Reviewed: 02/15/2014 [facility] Patient Information 2015 [place], [place]. This information is not intended to replace advice given to you by your health care provider. Make sure you discuss any questions you have with your health care provider.

## 2015-08-22 NOTE — Progress Notes (Signed)
Pre-visit discussion using our clinic review tool. No additional management support is needed unless otherwise documented below in the visit note.  
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
## 2015-08-24 NOTE — Assessment & Plan Note (Signed)
BMI is improving due to unintentional weight loss..  I have addressed  BMI and recommended a low glycemic index diet utilizing smaller more frequent meals to increase metabolism.  I have also recommended that patient start exercising with a goal of 30 minutes of aerobic exercise a minimum of 5 days per week.

## 2015-08-24 NOTE — Assessment & Plan Note (Signed)
Advised to continue PPI given antral erosions on recent EGD.  Adding hyoscyamine.

## 2015-08-24 NOTE — Assessment & Plan Note (Signed)
Xanax previously prescribed,  Changing to restoril

## 2015-08-26 ENCOUNTER — Other Ambulatory Visit: Payer: Self-pay

## 2015-08-27 ENCOUNTER — Other Ambulatory Visit: Payer: Self-pay | Admitting: Gastroenterology

## 2015-08-27 ENCOUNTER — Encounter: Payer: Self-pay | Admitting: Gastroenterology

## 2015-08-27 ENCOUNTER — Ambulatory Visit (INDEPENDENT_AMBULATORY_CARE_PROVIDER_SITE_OTHER): Payer: 59 | Admitting: Gastroenterology

## 2015-08-27 VITALS — BP 128/76 | HR 77 | Temp 98.4°F | Ht 63.0 in | Wt 166.0 lb

## 2015-08-27 DIAGNOSIS — K589 Irritable bowel syndrome without diarrhea: Secondary | ICD-10-CM

## 2015-08-27 DIAGNOSIS — R634 Abnormal weight loss: Secondary | ICD-10-CM | POA: Diagnosis not present

## 2015-08-27 DIAGNOSIS — R110 Nausea: Secondary | ICD-10-CM

## 2015-08-27 DIAGNOSIS — R197 Diarrhea, unspecified: Secondary | ICD-10-CM

## 2015-08-27 DIAGNOSIS — R1084 Generalized abdominal pain: Secondary | ICD-10-CM

## 2015-08-27 MED ORDER — DICYCLOMINE HCL 20 MG PO TABS: 20.0000 mg | ORAL_TABLET | Freq: Three times a day (TID) | ORAL | Status: DC

## 2015-08-27 NOTE — Progress Notes (Signed)
Gastroenterology Consultation  Referring Provider:     Crecencio Mc, MD Primary Care Physician:  Crecencio Mc, MD Primary Gastroenterologist:  Dr. Allen Norris     Reason for Consultation:     Second opinion        HPI:   Nichole Riddle is a 54 y.o. y/o female referred for consultation & management of abdominal pain and diarrhea by Dr. Crecencio Mc, MD.  This patient comes today with a history of irritable bowel syndrome with diarrhea predominance. The patient was seen in [place] by a gastroenterologist there who did an upper endoscopy and colonoscopy that showed her to have some gastric erosions but no ulcerations. The patient states she was told she had ulcerations. The patient's random colon biopsies were normal for any sort of inflammation such as microscopic colitis Crohn's or ulcerative colitis. The patient states that she has severe abdominal pain after she eats and she feels that she is full after only a few bites. The patient has also lost 15 pounds due to the pain. The patient also reports that she feels better when she takes Xanax. She has restarted her omeprazole last week and was also recommended to look at the Lotronex website for her irritable bowel syndrome. The patient states that she read the side effects and decided not to try Lotronex. She also has been taking Levbid which she thinks may be helping her slightly but she continues to have abdominal pain. There is no report of any black stools or bloody stools she also has had a scant in the past that did not show any cause of her symptoms.  Past Medical History  Diagnosis Date  . Gastric ulcer 2012    NSAID induced   . History of cardiac catheterization 2012    normal   . Gastric ulcer due to Helicobacter pylori 3295    and NSAID use  . Irritable bowel     with alternating constipation/bloating and diarrhea  . Migraines   . HLD (hyperlipidemia)   . Multiple gastric ulcers     esophagus  . Anxiety 1997  .  Depression 1997  . Bulimia 1997    per psychology note in chart  . IBS (irritable bowel syndrome) 06/17/2015    Past Surgical History  Procedure Laterality Date  . Abdominal hysterectomy    . Esophagogastroduodenoscopy    . Colonoscopy      Prior to Admission medications   Medication Sig Start Date End Date Taking? Authorizing Provider  estradiol (ESTRACE) 0.1 MG/GM vaginal cream Place 1 Applicatorful vaginally at bedtime.   Yes Historical Provider, MD  hyoscyamine (LEVBID) 0.375 MG 12 hr tablet Take 1 tablet (0.375 mg total) by mouth 2 (two) times daily. 08/22/15  Yes Crecencio Mc, MD  omeprazole (PRILOSEC) 40 MG capsule Take 1 capsule by mouth before breakfast daily. 08/22/15  Yes Crecencio Mc, MD  ondansetron (ZOFRAN) 4 MG tablet Take 1 tablet (4 mg total) by mouth daily as needed for nausea or vomiting. 03/31/15 03/30/16 Yes Nance Pear, MD  Probiotic Product (ALIGN) 4 MG CAPS Take 1 capsule by mouth daily.   Yes Historical Provider, MD  rizatriptan (MAXALT-MLT) 10 MG disintegrating tablet DISSOLVE 1 TABLET ON THE TONGUE AS NEEDED FOR MIGRAINE, MAY REPEAT IN 2 HOURS IF NEEDED 08/22/15  Yes Crecencio Mc, MD  temazepam (RESTORIL) 15 MG capsule Take 1 capsule (15 mg total) by mouth at bedtime as needed for sleep. 08/22/15  Yes Teresa L  Derrel Nip, MD  ALPRAZolam Duanne Moron) 0.5 MG tablet  07/28/15   Historical Provider, MD  dicyclomine (BENTYL) 20 MG tablet Take 1 tablet (20 mg total) by mouth 3 (three) times daily before meals. 08/27/15   Lucilla Lame, MD    Family History  Problem Relation Age of Onset  . Heart disease Father 31    died of massive mi  . Heart disease Brother 56    has had 4 MIs, cardiomyopathy  . Celiac disease Brother   . Esophageal cancer Maternal Grandmother   . Stomach cancer Maternal Grandmother   . Irritable bowel syndrome Mother   . Ulcerative colitis Mother      Social History  Substance Use Topics  . Smoking status: Never Smoker   . Smokeless tobacco: Never  Used  . Alcohol Use: 1.2 oz/week    2 Cans of beer, 0 Standard drinks or equivalent per week     Comment: occasional-beer    Allergies as of 08/27/2015 - Review Complete 08/27/2015  Allergen Reaction Noted  . Lipitor [atorvastatin] Other (See Comments) 12/02/2013  . Nitroglycerin Other (See Comments) 10/08/2014  . Prednisone Nausea And Vomiting 05/14/2014  . Topamax [topiramate]  04/17/2015  . Vicodin [hydrocodone-acetaminophen] Nausea And Vomiting 09/29/2011    Review of Systems:    All systems reviewed and negative except where noted in HPI.   Physical Exam:  BP 128/76 mmHg  Pulse 77  Temp(Src) 98.4 F (36.9 C) (Oral)  Ht 5\' 3"  (1.6 m)  Wt 166 lb (75.297 kg)  BMI 29.41 kg/m2 No LMP recorded. Patient has had a hysterectomy. Psych:  Alert and cooperative. Normal mood and affect. General:   Alert,  Well-developed, well-nourished, pleasant and cooperative in NAD Head:  Normocephalic and atraumatic. Eyes:  Sclera clear, no icterus.   Conjunctiva pink. Ears:  Normal auditory acuity. Nose:  No deformity, discharge, or lesions. Mouth:  No deformity or lesions,oropharynx pink & moist. Neck:  Supple; no masses or thyromegaly. Lungs:  Respirations even and unlabored.  Clear throughout to auscultation.   No wheezes, crackles, or rhonchi. No acute distress. Heart:  Regular rate and rhythm; no murmurs, clicks, rubs, or gallops. Abdomen:  Normal bowel sounds.  No bruits.  Soft, right upper quadrant tenderness and non-distended without masses, hepatosplenomegaly or hernias noted.  No guarding or rebound tenderness.  Negative Carnett sign.   Rectal:  Deferred.  Msk:  Symmetrical without gross deformities.  Good, equal movement & strength bilaterally. Pulses:  Normal pulses noted. Extremities:  No clubbing or edema.  No cyanosis. Neurologic:  Alert and oriented x3;  grossly normal neurologically. Skin:  Intact without significant lesions or rashes.  No jaundice. Lymph Nodes:  No  significant cervical adenopathy. Psych:  Alert and cooperative. Normal mood and affect.  Imaging Studies: No results found.  Assessment and Plan:   Nichole Riddle is a 54 y.o. y/o female who comes in today with a workup done in [place] which was negative for the cause of her abdominal pain and weight loss. The doctor reported the patient have irritable bowel syndrome and suggested Lotronex. The patient did not want to start the Lotronex. She says it was because of the side effects he read about. The patient will be switched from Levbid to dicyclomine 3 times a day. She will also be started on a trial of fibrosis for her diarrhea. The patient also has right upper quadrant pain for which she will be set up for a right upper quadrant ultrasound with a  HIDA scan. The patient has been explained the plan and agrees with it.   Note: This dictation was prepared with Dragon dictation along with smaller phrase technology. Any transcriptional errors that result from this process are unintentional.

## 2015-09-05 ENCOUNTER — Ambulatory Visit
Admission: RE | Admit: 2015-09-05 | Discharge: 2015-09-05 | Disposition: A | Discharge status: Home / Self Care | Payer: 59 | Source: Ambulatory Visit | Attending: Gastroenterology | Admitting: Gastroenterology

## 2015-09-05 ENCOUNTER — Encounter
Admission: RE | Admit: 2015-09-05 | Discharge: 2015-09-05 | Disposition: A | Discharge status: Home / Self Care | Payer: 59 | Source: Ambulatory Visit | Attending: Gastroenterology | Admitting: Gastroenterology

## 2015-09-05 DIAGNOSIS — R197 Diarrhea, unspecified: Secondary | ICD-10-CM

## 2015-09-05 DIAGNOSIS — R110 Nausea: Secondary | ICD-10-CM | POA: Diagnosis not present

## 2015-09-05 DIAGNOSIS — R634 Abnormal weight loss: Secondary | ICD-10-CM | POA: Diagnosis present

## 2015-09-05 DIAGNOSIS — R1084 Generalized abdominal pain: Secondary | ICD-10-CM

## 2015-09-05 MED ORDER — SINCALIDE 5 MCG IJ SOLR
0.0200 ug/kg | Freq: Once | INTRAMUSCULAR | Status: AC
  Administered 2015-09-05: 1.51 ug via INTRAVENOUS
  Filled 2015-09-05: qty 5

## 2015-09-05 MED ORDER — TECHNETIUM TC 99M MEBROFENIN IV KIT
5.3600 | PACK | Freq: Once | INTRAVENOUS | Status: DC | PRN
  Administered 2015-09-05: 5.36 via INTRAVENOUS
  Filled 2015-09-05: qty 6

## 2015-09-08 ENCOUNTER — Telehealth: Payer: Self-pay

## 2015-09-08 NOTE — Telephone Encounter (Signed)
-----   Message from Lucilla Lame, MD sent at 09/08/2015  9:07 AM EDT ----- Let the patient know the ultrasound and the HIDA scan showed a normal gall bladder ith normal function.

## 2015-09-08 NOTE — Telephone Encounter (Signed)
Pt notified of results. Will continue taking medication Dr.Wohl prescribed for a while and let me know if any problems.

## 2015-09-16 ENCOUNTER — Telehealth: Payer: Self-pay | Admitting: Internal Medicine

## 2015-09-16 ENCOUNTER — Other Ambulatory Visit: Payer: Self-pay | Admitting: Internal Medicine

## 2015-09-16 MED ORDER — ALPRAZOLAM 0.5 MG PO TABS: 0.5000 mg | ORAL_TABLET | Freq: Every evening | ORAL | Status: DC | PRN

## 2015-09-16 NOTE — Telephone Encounter (Signed)
Pt called to stated that since taking new medication (Temazepam) to help her sleep is not working and she is only getting about 2hrs of sleep. Pt stated that she sleeps better using Xanax. Pt needs rx for Xanax. Pt request to be taken off of Temazepam. Please advise pt.msn

## 2015-09-16 NOTE — Telephone Encounter (Signed)
Ok to refill alprazolam #30 1 refill.  rx printed

## 2015-09-16 NOTE — Telephone Encounter (Signed)
Change was completed on 08/22/15.  Please advise?

## 2015-10-28 ENCOUNTER — Ambulatory Visit: Payer: 59 | Admitting: Internal Medicine

## 2015-11-10 ENCOUNTER — Ambulatory Visit (INDEPENDENT_AMBULATORY_CARE_PROVIDER_SITE_OTHER): Payer: Self-pay | Admitting: Nurse Practitioner

## 2015-11-10 ENCOUNTER — Encounter: Payer: Self-pay | Admitting: Nurse Practitioner

## 2015-11-10 VITALS — BP 112/64 | HR 88 | Temp 98.1°F | Wt 167.0 lb

## 2015-11-10 DIAGNOSIS — J069 Acute upper respiratory infection, unspecified: Secondary | ICD-10-CM

## 2015-11-10 MED ORDER — AZITHROMYCIN 250 MG PO TABS: ORAL_TABLET | ORAL | Status: DC

## 2015-11-10 NOTE — Patient Instructions (Addendum)
Flonase over the counter. Use two sprays in each nostril once daily.   Robitussin or Delsym are good cough syrups over the counter.   Continue probiotic.   Call us if no improvement in 1 week.

## 2015-11-10 NOTE — Assessment & Plan Note (Signed)
Patient was given Z-Pak due to duration of symptoms and worsening of cough. Encouraged continued probiotic use. Encouraged OTC Flonase and other current measures. Patient was intolerant to NyQuil. FU prn worsening/failure to improve.

## 2015-11-10 NOTE — Progress Notes (Signed)
Patient ID: BABBIE PHANN, female    DOB: June 28, 1961  Age: 54 y.o. MRN: [id number]  CC: Cough   HPI Marlenne LIANA DEENER presents for CC of cough x 6 days.   1) Started dry, last 3 days has been productive yellow and blood tinged, nasal congestion, nose bleeds  Improved then worsened  Between 98-100 temperature   Treatment at home:  Alkaseltzer plus (cold)- helpful somewhat  NyQuil- opposite- kept her up and diarrhea   Tylenol   Sick contacts- family   History Ellise has a past medical history of Gastric ulcer (2012); History of cardiac catheterization (2012); Gastric ulcer due to Helicobacter pylori (0000000); Irritable bowel; Migraines; HLD (hyperlipidemia); Multiple gastric ulcers; Anxiety (1997); Depression (1997); Bulimia (1997); and IBS (irritable bowel syndrome) (06/17/2015).   She has past surgical history that includes Abdominal hysterectomy; Esophagogastroduodenoscopy; and Colonoscopy.   Her family history includes Celiac disease in her brother; Esophageal cancer in her maternal grandmother; Heart disease (age of onset: 13) in her brother; Heart disease (age of onset: 63) in her father; Irritable bowel syndrome in her mother; Stomach cancer in her maternal grandmother; Ulcerative colitis in her mother.She reports that she has never smoked. She has never used smokeless tobacco. She reports that she drinks about 1.2 oz of alcohol per week. She reports that she does not use illicit drugs.  Outpatient Prescriptions Prior to Visit  Medication Sig Dispense Refill  . ALPRAZolam (XANAX) 0.5 MG tablet Take 1 tablet (0.5 mg total) by mouth at bedtime as needed for anxiety. 30 tablet 1  . dicyclomine (BENTYL) 20 MG tablet Take 1 tablet (20 mg total) by mouth 3 (three) times daily before meals. 90 tablet 3  . estradiol (ESTRACE) 0.1 MG/GM vaginal cream Place 1 Applicatorful vaginally at bedtime.    . hyoscyamine (LEVBID) 0.375 MG 12 hr tablet Take 1 tablet (0.375 mg total) by mouth 2 (two) times  daily. 60 tablet 5  . omeprazole (PRILOSEC) 40 MG capsule Take 1 capsule by mouth before breakfast daily. 90 capsule 3  . ondansetron (ZOFRAN) 4 MG tablet Take 1 tablet (4 mg total) by mouth daily as needed for nausea or vomiting. 30 tablet 1  . Probiotic Product (ALIGN) 4 MG CAPS Take 1 capsule by mouth daily.    . rizatriptan (MAXALT-MLT) 10 MG disintegrating tablet DISSOLVE 1 TABLET ON THE TONGUE AS NEEDED FOR MIGRAINE, MAY REPEAT IN 2 HOURS IF NEEDED 10 tablet 3   No facility-administered medications prior to visit.    ROS Review of Systems  Constitutional: Positive for fever, chills, diaphoresis and fatigue.  HENT: Positive for congestion, ear pain, nosebleeds, postnasal drip, rhinorrhea, sinus pressure, sneezing and sore throat. Negative for ear discharge.   Respiratory: Positive for cough. Negative for chest tightness and wheezing.   Gastrointestinal: Negative for nausea, vomiting and diarrhea.  Skin: Negative for rash.  Neurological: Positive for headaches.   Objective:  BP 112/64 mmHg  Pulse 88  Temp(Src) 98.1 F (36.7 C) (Oral)  Wt 167 lb (75.751 kg)  SpO2 98%  Physical Exam  Constitutional: She is oriented to person, place, and time. She appears well-developed and well-nourished. No distress.  HENT:  Head: Normocephalic and atraumatic.  Right Ear: External ear normal.  Left Ear: External ear normal.  Mouth/Throat: No oropharyngeal exudate.  Nasal mucosa edematous TMs clear bilaterally without retraction, bulging, or injection  Eyes: EOM are normal. Pupils are equal, round, and reactive to light. Right eye exhibits no discharge. Left eye exhibits no discharge.  No scleral icterus.  Neck: Normal range of motion. Neck supple.  Cardiovascular: Normal rate, regular rhythm and normal heart sounds.  Exam reveals no gallop and no friction rub.   No murmur heard. Pulmonary/Chest: Effort normal and breath sounds normal. No respiratory distress. She has no wheezes. She has no  rales. She exhibits no tenderness.  Lymphadenopathy:    She has no cervical adenopathy.  Neurological: She is alert and oriented to person, place, and time. No cranial nerve deficit. She exhibits normal muscle tone. Coordination normal.  Skin: Skin is warm and dry. No rash noted. She is not diaphoretic.  Psychiatric: She has a normal mood and affect. Her behavior is normal. Judgment and thought content normal.   Assessment & Plan:   Ernestyne was seen today for cough.  Diagnoses and all orders for this visit:  Acute URI  Other orders -     azithromycin (ZITHROMAX) 250 MG tablet; Take 2 tablets by mouth on day 1, take 1 tablet by mouth each day after for 4 days.  I am having Ms. Klebba start on azithromycin. I am also having her maintain her ondansetron, ALIGN, estradiol, omeprazole, hyoscyamine, rizatriptan, dicyclomine, and ALPRAZolam.  Meds ordered this encounter  Medications  . azithromycin (ZITHROMAX) 250 MG tablet    Sig: Take 2 tablets by mouth on day 1, take 1 tablet by mouth each day after for 4 days.    Dispense:  6 each    Refill:  0    Order Specific Question:  Supervising Provider    Answer:  Crecencio Mc [[id number]]     Follow-up: Return if symptoms worsen or fail to improve.

## 2015-11-20 ENCOUNTER — Other Ambulatory Visit: Payer: Self-pay | Admitting: Internal Medicine

## 2015-11-21 NOTE — Telephone Encounter (Signed)
Ok to refill,  printed rx  

## 2015-11-25 ENCOUNTER — Telehealth: Payer: Self-pay | Admitting: *Deleted

## 2015-11-25 ENCOUNTER — Other Ambulatory Visit: Payer: Self-pay | Admitting: Nurse Practitioner

## 2015-11-25 MED ORDER — ALBUTEROL SULFATE HFA 108 (90 BASE) MCG/ACT IN AERS: 2.0000 | INHALATION_SPRAY | Freq: Four times a day (QID) | RESPIRATORY_TRACT | Status: DC | PRN

## 2015-11-25 MED ORDER — BENZONATATE 200 MG PO CAPS: 200.0000 mg | ORAL_CAPSULE | Freq: Three times a day (TID) | ORAL | Status: DC | PRN

## 2015-11-25 NOTE — Telephone Encounter (Signed)
Patient has requested a Rx for her cough. She was seen 11/10/15 for the cough. Please Advise

## 2015-11-25 NOTE — Telephone Encounter (Signed)
Since she has a contraindication to Prednisone- I sent in Tessalon perles and an albuterol inhaler. The pharmacy will show her how to use if she has not had one before. Thank you!

## 2015-11-25 NOTE — Telephone Encounter (Signed)
Notified patient that Rx has been sent to pharmacy

## 2015-11-25 NOTE — Telephone Encounter (Signed)
Please advise 

## 2015-12-22 ENCOUNTER — Encounter: Payer: Self-pay | Admitting: Emergency Medicine

## 2015-12-22 ENCOUNTER — Emergency Department
Admission: EM | Admit: 2015-12-22 | Discharge: 2015-12-22 | Disposition: A | Discharge status: Home / Self Care | Payer: BLUE CROSS/BLUE SHIELD | Attending: Emergency Medicine | Admitting: Emergency Medicine

## 2015-12-22 ENCOUNTER — Telehealth: Payer: Self-pay | Admitting: Internal Medicine

## 2015-12-22 DIAGNOSIS — Z792 Long term (current) use of antibiotics: Secondary | ICD-10-CM | POA: Diagnosis not present

## 2015-12-22 DIAGNOSIS — N3090 Cystitis, unspecified without hematuria: Secondary | ICD-10-CM | POA: Diagnosis not present

## 2015-12-22 DIAGNOSIS — R300 Dysuria: Secondary | ICD-10-CM | POA: Diagnosis present

## 2015-12-22 LAB — BASIC METABOLIC PANEL
ANION GAP: 8 (ref 5–15)
BUN: 12 mg/dL (ref 6–20)
CO2: 25 mmol/L (ref 22–32)
Calcium: 9.4 mg/dL (ref 8.9–10.3)
Chloride: 108 mmol/L (ref 101–111)
Creatinine, Ser: 0.77 mg/dL (ref 0.44–1.00)
GFR calc Af Amer: >60 mL/min (ref >60)
Glucose, Bld: 99 mg/dL (ref 65–99)
POTASSIUM: 4 mmol/L (ref 3.5–5.1)
SODIUM: 141 mmol/L (ref 135–145)

## 2015-12-22 LAB — CBC WITH DIFFERENTIAL/PLATELET
BASOS ABS: 0.1 10*3/uL (ref 0–0.1)
Basophils Relative: 1 %
EOS PCT: 1 %
Eosinophils Absolute: 0.1 10*3/uL (ref 0–0.7)
HCT: 39.6 % (ref 35.0–47.0)
HEMOGLOBIN: 12.7 g/dL (ref 12.0–16.0)
LYMPHS ABS: 1.6 10*3/uL (ref 1.0–3.6)
LYMPHS PCT: 14 %
MCH: 26.4 pg (ref 26.0–34.0)
MCHC: 32.1 g/dL (ref 32.0–36.0)
MCV: 82.4 fL (ref 80.0–100.0)
Monocytes Absolute: 0.5 10*3/uL (ref 0.2–0.9)
Monocytes Relative: 5 %
NEUTROS ABS: 9.5 10*3/uL — AB (ref 1.4–6.5)
NEUTROS PCT: 79 %
PLATELETS: 274 10*3/uL (ref 150–440)
RBC: 4.8 MIL/uL (ref 3.80–5.20)
RDW: 14 % (ref 11.5–14.5)
WBC: 11.9 10*3/uL — AB (ref 3.6–11.0)

## 2015-12-22 LAB — URINALYSIS COMPLETE WITH MICROSCOPIC (ARMC ONLY)
BILIRUBIN URINE: NEGATIVE
Glucose, UA: NEGATIVE mg/dL
KETONES UR: NEGATIVE mg/dL
Nitrite: POSITIVE — AB
PH: 8 (ref 5.0–8.0)
PROTEIN: 30 mg/dL — AB
Specific Gravity, Urine: 1.002 — ABNORMAL LOW (ref 1.005–1.030)

## 2015-12-22 MED ORDER — SODIUM CHLORIDE 0.9 % IV BOLUS (SEPSIS)
1000.0000 mL | Freq: Once | INTRAVENOUS | Status: AC
  Administered 2015-12-22: 1000 mL via INTRAVENOUS

## 2015-12-22 MED ORDER — SULFAMETHOXAZOLE-TRIMETHOPRIM 800-160 MG PO TABS
1.0000 | ORAL_TABLET | Freq: Two times a day (BID) | ORAL | Status: DC
Start: 2015-12-22 — End: 2015-12-24

## 2015-12-22 MED ORDER — TRAMADOL HCL 50 MG PO TABS
50.0000 mg | ORAL_TABLET | Freq: Three times a day (TID) | ORAL | Status: DC | PRN
Start: 2015-12-22 — End: 2016-08-12

## 2015-12-22 MED ORDER — KETOROLAC TROMETHAMINE 30 MG/ML IJ SOLN
30.0000 mg | Freq: Once | INTRAMUSCULAR | Status: AC
  Administered 2015-12-22: 30 mg via INTRAVENOUS
  Filled 2015-12-22: qty 1

## 2015-12-22 MED ORDER — ONDANSETRON HCL 4 MG/2ML IJ SOLN
4.0000 mg | Freq: Once | INTRAMUSCULAR | Status: AC
  Administered 2015-12-22: 4 mg via INTRAVENOUS
  Filled 2015-12-22: qty 2

## 2015-12-22 MED ORDER — ONDANSETRON 8 MG PO TBDP: 8.0000 mg | ORAL_TABLET | Freq: Three times a day (TID) | ORAL | Status: DC | PRN

## 2015-12-22 MED ORDER — OXYCODONE-ACETAMINOPHEN 5-325 MG PO TABS
1.0000 | ORAL_TABLET | Freq: Once | ORAL | Status: AC
  Administered 2015-12-22: 1 via ORAL
  Filled 2015-12-22: qty 1

## 2015-12-22 NOTE — Telephone Encounter (Signed)
Pt c/o lower abdomen pain was seen in the ED this morning for UTI. She was given a ketorolac (TORADOL) 30 MG/ML injection 30 mg and oxyCODONE-acetaminophen (PERCOCET/ROXICET) 5-325 MG per tablet 1 tablet Once  Pt states that the Percocet made her really sick. Pt wants to know if you can prescribe something for pain. Please advise

## 2015-12-22 NOTE — Telephone Encounter (Signed)
Pt went to the ER with a UTI. They gave her medicine for the UTI. Pt is in a lot of pain and was wondering if Dr. Derrel Nip would prescribed some pain medication.  Can call pt at home.

## 2015-12-22 NOTE — ED Notes (Signed)
Blood in urine this am, lower abd pain, bilateral flank pain and burning with urination that began yesterday am.

## 2015-12-22 NOTE — ED Provider Notes (Signed)
[facility] Emergency Department Provider Note  ____________________________________________  Time seen: 8:00 AM  I have reviewed the triage vital signs and the nursing notes.   HISTORY  Chief Complaint Dysuria; Hematuria; Abdominal Pain; and Flank Pain    HPI Nichole Riddle is a 55 y.o. female who complains of suprapubic pain with dysuria and urgency 2 days. She also noticed some hematuria this morning. The pain is cramping and sharp, colicky and waxing and waning. Radiates to the right lower quadrant and right lower back and flank. Aggravated by urination and reaches its worst 20-30 minutes after urinating. No alleviating factors. Some nausea but no vomiting, no fevers chills or diarrhea. No trauma.  Has positive history of urinary tract infections. No history of kidney stones although they do run in her family. Has had a hysterectomy and tubal ligation but still has her appendix and gallbladder     Past Medical History  Diagnosis Date  . Gastric ulcer 2012    NSAID induced   . History of cardiac catheterization 2012    normal   . Gastric ulcer due to Helicobacter pylori 0000000    and NSAID use  . Irritable bowel     with alternating constipation/bloating and diarrhea  . Migraines   . HLD (hyperlipidemia)   . Multiple gastric ulcers     esophagus  . Anxiety 1997  . Depression 1997  . Bulimia 1997    per psychology note in chart  . IBS (irritable bowel syndrome) 06/17/2015     Patient Active Problem List   Diagnosis Date Noted  . Acute URI 11/10/2015  . IBS (irritable bowel syndrome) 06/17/2015  . Insomnia 06/12/2015  . Early satiety 04/18/2015  . Rectal bleeding 02/23/2015  . Abdominal bloating 02/23/2015  . Adaptive colitis 02/19/2015  . Thumb pain 12/06/2014  . Extremity pain 12/06/2014  . Postmenopausal vaginal bleeding 11/10/2014  . Cluster headache, episodic 10/09/2014  . Elevation of level of transaminase or lactic acid  dehydrogenase (LDH) 05/15/2014  . Enlarged submental lymph node 03/12/2014  . Statin intolerance 12/02/2013  . Allergy to substance 12/02/2013  . Encounter for preventive health examination 08/05/2013  . Polyarthralgia 03/11/2013  . Arthralgia of multiple joints 03/11/2013  . Anxiety and depression 10/27/2012  . Depression, neurotic 10/27/2012  . Hyperlipidemia LDL goal < 130 02/20/2012  . Menopause 10/02/2011  . Obesity 10/02/2011  . Headache, migraine 09/29/2011  . History of colonoscopy 09/29/2011  . History of gastric ulcer   . History of cardiac catheterization      Past Surgical History  Procedure Laterality Date  . Abdominal hysterectomy    . Esophagogastroduodenoscopy    . Colonoscopy       Current Outpatient Rx  Name  Route  Sig  Dispense  Refill  . ALPRAZolam (XANAX) 0.5 MG tablet      TAKE 1 TABLET BY MOUTH EVERY NIGHT AT BEDTIME AS NEEDED FOR ANXIETY   30 tablet   1     Not to exceed 4 additional fills before 03/14/2016   . ondansetron (ZOFRAN) 4 MG tablet   Oral   Take 1 tablet (4 mg total) by mouth daily as needed for nausea or vomiting.   30 tablet   1   . rizatriptan (MAXALT-MLT) 10 MG disintegrating tablet      DISSOLVE 1 TABLET ON THE TONGUE AS NEEDED FOR MIGRAINE, MAY REPEAT IN 2 HOURS IF NEEDED   10 tablet   3   . albuterol (PROVENTIL HFA;VENTOLIN  HFA) 108 (90 BASE) MCG/ACT inhaler   Inhalation   Inhale 2 puffs into the lungs every 6 (six) hours as needed for wheezing or shortness of breath. Patient not taking: Reported on 12/22/2015   1 Inhaler   2   . benzonatate (TESSALON) 200 MG capsule   Oral   Take 1 capsule (200 mg total) by mouth 3 (three) times daily as needed for cough. Patient not taking: Reported on 12/22/2015   20 capsule   0   . dicyclomine (BENTYL) 20 MG tablet   Oral   Take 1 tablet (20 mg total) by mouth 3 (three) times daily before meals. Patient not taking: Reported on 12/22/2015   90 tablet   3   . hyoscyamine  (LEVBID) 0.375 MG 12 hr tablet   Oral   Take 1 tablet (0.375 mg total) by mouth 2 (two) times daily. Patient not taking: Reported on 12/22/2015   60 tablet   5   . omeprazole (PRILOSEC) 40 MG capsule      Take 1 capsule by mouth before breakfast daily. Patient not taking: Reported on 12/22/2015   90 capsule   3   . ondansetron (ZOFRAN ODT) 8 MG disintegrating tablet   Oral   Take 1 tablet (8 mg total) by mouth every 8 (eight) hours as needed for nausea or vomiting.   20 tablet   0   . sulfamethoxazole-trimethoprim (BACTRIM DS) 800-160 MG tablet   Oral   Take 1 tablet by mouth 2 (two) times daily.   14 tablet   0      Allergies Lipitor; Nitroglycerin; Prednisone; Topamax; and Vicodin   Family History  Problem Relation Age of Onset  . Heart disease Father 57    died of massive mi  . Heart disease Brother 36    has had 4 MIs, cardiomyopathy  . Celiac disease Brother   . Esophageal cancer Maternal Grandmother   . Stomach cancer Maternal Grandmother   . Irritable bowel syndrome Mother   . Ulcerative colitis Mother     Social History Social History  Substance Use Topics  . Smoking status: Never Smoker   . Smokeless tobacco: Never Used  . Alcohol Use: 1.2 oz/week    2 Cans of beer, 0 Standard drinks or equivalent per week     Comment: occasional-beer    Review of Systems  Constitutional:   No fever or chills. No weight changes Eyes:   No blurry vision or double vision.  ENT:   No sore throat. Cardiovascular:   No chest pain. Respiratory:   No dyspnea or cough. Gastrointestinal:   Positive as above for abdominal pain, without vomiting and diarrhea.  No BRBPR or melena. Genitourinary:   Positive dysuria and bloody urine. Positive urgency. Musculoskeletal:   Negative for back pain. No joint swelling or pain. Positive flank pain Skin:   Negative for rash. Neurological:   Negative for headaches, focal weakness or numbness. Psychiatric:  No anxiety or depression.    Endocrine:  No hot/cold intolerance, changes in energy, or sleep difficulty.  10-point ROS otherwise negative.  ____________________________________________   PHYSICAL EXAM:  VITAL SIGNS: ED Triage Vitals  Enc Vitals Group     BP 12/22/15 0751 135/107 mmHg     Pulse Rate 12/22/15 0751 92     Resp 12/22/15 0751 18     Temp 12/22/15 0751 98.1 F (36.7 C)     Temp Source 12/22/15 0751 Oral     SpO2 12/22/15 0751  99 %     Weight 12/22/15 0751 165 lb (74.844 kg)     Height 12/22/15 0751 5\' 3"  (1.6 m)     Head Cir --      Peak Flow --      Pain Score 12/22/15 0751 8     Pain Loc --      Pain Edu? --      Excl. in [place]? --     Vital signs reviewed, nursing assessments reviewed.   Constitutional:   Alert and oriented. Well appearing and in no distress. Eyes:   No scleral icterus. No conjunctival pallor. PERRL. EOMI ENT   Head:   Normocephalic and atraumatic.   Nose:   No congestion/rhinnorhea. No septal hematoma   Mouth/Throat:   MMM, no pharyngeal erythema. No peritonsillar mass. No uvula shift.   Neck:   No stridor. No SubQ emphysema. No meningismus. Hematological/Lymphatic/Immunilogical:   No cervical lymphadenopathy. Cardiovascular:   RRR. Normal and symmetric distal pulses are present in all extremities. No murmurs, rubs, or gallops. Respiratory:   Normal respiratory effort without tachypnea nor retractions. Breath sounds are clear and equal bilaterally. No wheezes/rales/rhonchi. Gastrointestinal:   Soft with significant suprapubic tenderness. There is also some right lower quadrant tenderness in the area of McBurney's point. No distention. There is no CVA tenderness.  No rebound, rigidity, or guarding. Genitourinary:   deferred Musculoskeletal:   Nontender with normal range of motion in all extremities. No joint effusions.  No lower extremity tenderness.  No edema. Neurologic:   Normal speech and language.  CN 2-10 normal. Motor grossly intact. No pronator  drift.  Normal gait. No gross focal neurologic deficits are appreciated.  Skin:    Skin is warm, dry and intact. No rash noted.  No petechiae, purpura, or bullae. Psychiatric:   Mood and affect are normal. Speech and behavior are normal. Patient exhibits appropriate insight and judgment.  ____________________________________________    LABS (pertinent positives/negatives) (all labs ordered are listed, but only abnormal results are displayed) Labs Reviewed  CBC WITH DIFFERENTIAL/PLATELET - Abnormal; Notable for the following:    WBC 11.9 (*)    Neutro Abs 9.5 (*)    All other components within normal limits  URINALYSIS COMPLETEWITH MICROSCOPIC ([facility] ONLY) - Abnormal; Notable for the following:    Color, Urine AMBER (*)    APPearance CLEAR (*)    Specific Gravity, Urine 1.002 (*)    Hgb urine dipstick 3+ (*)    Protein, ur 30 (*)    Nitrite POSITIVE (*)    Leukocytes, UA 3+ (*)    Bacteria, UA FEW (*)    Squamous Epithelial / LPF 0-5 (*)    All other components within normal limits  URINE CULTURE  BASIC METABOLIC PANEL   ____________________________________________   EKG    ____________________________________________    RADIOLOGY    ____________________________________________   PROCEDURES   ____________________________________________   INITIAL IMPRESSION / ASSESSMENT AND PLAN / ED COURSE  Pertinent labs & imaging results that were available during my care of the patient were reviewed by me and considered in my medical decision making (see chart for details).  Patient presents with signs and symptoms of cystitis, but with her specific presentation differential also includes kidney stones and appendicitis. Low suspicion for perforation obstruction or abscess. Low suspicion for STI PID TOA or torsion. We'll check labs while giving IV Toradol fluids and Zofran. If she doesn't have an obvious urinary tract infection we will likely need to proceed with  CT  scan.  ----------------------------------------- 9:36 AM on 12/22/2015 -----------------------------------------  Labs reveal clear urinary tract infection with nitrite-positive urine. Chemistry is unremarkable. Vital signs stable. We'll start on Bactrim and have her follow-up with her primary care doctor. At this point a low suspicion for an obstructing stone or appendicitis.     ____________________________________________   FINAL CLINICAL IMPRESSION(S) / ED DIAGNOSES  Final diagnoses:  Cystitis      Carrie Mew, MD 12/22/15 [phone]

## 2015-12-22 NOTE — Telephone Encounter (Signed)
Script faxed to [facility] [address] and patient agreed to Tramadol.

## 2015-12-22 NOTE — ED Notes (Signed)
Pt denies hx of kidney stones 

## 2015-12-22 NOTE — Telephone Encounter (Signed)
I will  only prescribe tramadol without an OV

## 2015-12-22 NOTE — Discharge Instructions (Signed)

## 2015-12-23 LAB — URINE CULTURE

## 2015-12-24 ENCOUNTER — Ambulatory Visit (INDEPENDENT_AMBULATORY_CARE_PROVIDER_SITE_OTHER): Payer: BLUE CROSS/BLUE SHIELD | Admitting: Internal Medicine

## 2015-12-24 ENCOUNTER — Encounter: Payer: Self-pay | Admitting: Internal Medicine

## 2015-12-24 VITALS — BP 106/62 | HR 72 | Temp 98.1°F | Resp 12 | Ht 63.0 in | Wt 166.0 lb

## 2015-12-24 DIAGNOSIS — K625 Hemorrhage of anus and rectum: Secondary | ICD-10-CM | POA: Diagnosis not present

## 2015-12-24 DIAGNOSIS — Z1239 Encounter for other screening for malignant neoplasm of breast: Secondary | ICD-10-CM | POA: Diagnosis not present

## 2015-12-24 DIAGNOSIS — Z0000 Encounter for general adult medical examination without abnormal findings: Secondary | ICD-10-CM

## 2015-12-24 DIAGNOSIS — Z0001 Encounter for general adult medical examination with abnormal findings: Secondary | ICD-10-CM

## 2015-12-24 DIAGNOSIS — R300 Dysuria: Secondary | ICD-10-CM

## 2015-12-24 DIAGNOSIS — G4700 Insomnia, unspecified: Secondary | ICD-10-CM

## 2015-12-24 DIAGNOSIS — Z8719 Personal history of other diseases of the digestive system: Secondary | ICD-10-CM | POA: Diagnosis not present

## 2015-12-24 DIAGNOSIS — N3090 Cystitis, unspecified without hematuria: Secondary | ICD-10-CM | POA: Diagnosis not present

## 2015-12-24 DIAGNOSIS — K589 Irritable bowel syndrome without diarrhea: Secondary | ICD-10-CM

## 2015-12-24 DIAGNOSIS — E785 Hyperlipidemia, unspecified: Secondary | ICD-10-CM

## 2015-12-24 DIAGNOSIS — Z8711 Personal history of peptic ulcer disease: Secondary | ICD-10-CM

## 2015-12-24 MED ORDER — ESTRADIOL 0.1 MG/GM VA CREA: 1.0000 | TOPICAL_CREAM | Freq: Every day | VAGINAL | Status: DC

## 2015-12-24 MED ORDER — ALPRAZOLAM 0.5 MG PO TABS: ORAL_TABLET | ORAL | Status: DC

## 2015-12-24 MED ORDER — FLUCONAZOLE 150 MG PO TABS: 150.0000 mg | ORAL_TABLET | Freq: Every day | ORAL | Status: DC

## 2015-12-24 MED ORDER — CIPROFLOXACIN HCL 250 MG PO TABS: 250.0000 mg | ORAL_TABLET | Freq: Two times a day (BID) | ORAL | Status: DC

## 2015-12-24 NOTE — Progress Notes (Signed)
Pre-visit discussion using our clinic review tool. No additional management support is needed unless otherwise documented below in the visit note.  

## 2015-12-24 NOTE — Patient Instructions (Addendum)
Return for for fasting labs, thyroid  and vitamin D level soon  Fluconazole, estrace and cipro sent to [facility]    Start exercising daily for 15  Minutes!!   Mammogram ordered (3d)  Menopause is a normal process in which your reproductive ability comes to an end. This process happens gradually over a span of months to years, usually between the ages of 58 and 23. Menopause is complete when you have missed 12 consecutive menstrual periods. It is important to talk with your health care provider about some of the most common conditions that affect postmenopausal women, such as heart disease, cancer, and bone loss (osteoporosis). Adopting a healthy lifestyle and getting preventive care can help to promote your health and wellness. Those actions can also lower your chances of developing some of these common conditions. WHAT SHOULD I KNOW ABOUT MENOPAUSE? During menopause, you may experience a number of symptoms, such as:  Moderate-to-severe hot flashes.  Night sweats.  Decrease in sex drive.  Mood swings.  Headaches.  Tiredness.  Irritability.  Memory problems.  Insomnia. Choosing to treat or not to treat menopausal changes is an individual decision that you make with your health care provider. WHAT SHOULD I KNOW ABOUT HORMONE REPLACEMENT THERAPY AND SUPPLEMENTS? Hormone therapy products are effective for treating symptoms that are associated with menopause, such as hot flashes and night sweats. Hormone replacement carries certain risks, especially as you become older. If you are thinking about using estrogen or estrogen with progestin treatments, discuss the benefits and risks with your health care provider. WHAT SHOULD I KNOW ABOUT HEART DISEASE AND STROKE? Heart disease, heart attack, and stroke become more likely as you age. This may be due, in part, to the hormonal changes that your body experiences during menopause. These can affect how your body processes dietary fats, triglycerides,  and cholesterol. Heart attack and stroke are both medical emergencies. There are many things that you can do to help prevent heart disease and stroke:  Have your blood pressure checked at least every 1-2 years. High blood pressure causes heart disease and increases the risk of stroke.  If you are 71-71 years old, ask your health care provider if you should take aspirin to prevent a heart attack or a stroke.  Do not use any tobacco products, including cigarettes, chewing tobacco, or electronic cigarettes. If you need help quitting, ask your health care provider.  It is important to eat a healthy diet and maintain a healthy weight.  Be sure to include plenty of vegetables, fruits, low-fat dairy products, and lean protein.  Avoid eating foods that are high in solid fats, added sugars, or salt (sodium).  Get regular exercise. This is one of the most important things that you can do for your health.  Try to exercise for at least 150 minutes each week. The type of exercise that you do should increase your heart rate and make you sweat. This is known as moderate-intensity exercise.  Try to do strengthening exercises at least twice each week. Do these in addition to the moderate-intensity exercise.  Know your numbers.Ask your health care provider to check your cholesterol and your blood glucose. Continue to have your blood tested as directed by your health care provider. WHAT SHOULD I KNOW ABOUT CANCER SCREENING? There are several types of cancer. Take the following steps to reduce your risk and to catch any cancer development as early as possible. Breast Cancer  Practice breast self-awareness.  This means understanding how your breasts normally  appear and feel.  It also means doing regular breast self-exams. Let your health care provider know about any changes, no matter how small.  If you are 84 or older, have a clinician do a breast exam (clinical breast exam or CBE) every year. Depending  on your age, family history, and medical history, it may be recommended that you also have a yearly breast X-ray (mammogram).  If you have a family history of breast cancer, talk with your health care provider about genetic screening.  If you are at high risk for breast cancer, talk with your health care provider about having an MRI and a mammogram every year.  Breast cancer (BRCA) gene test is recommended for women who have family members with BRCA-related cancers. Results of the assessment will determine the need for genetic counseling and BRCA1 and for BRCA2 testing. BRCA-related cancers include these types:  Breast. This occurs in males or females.  Ovarian.  Tubal. This may also be called fallopian tube cancer.  Cancer of the abdominal or pelvic lining (peritoneal cancer).  Prostate.  Pancreatic. Cervical, Uterine, and Ovarian Cancer Your health care provider may recommend that you be screened regularly for cancer of the pelvic organs. These include your ovaries, uterus, and vagina. This screening involves a pelvic exam, which includes checking for microscopic changes to the surface of your cervix (Pap test).  For women ages 21-65, health care providers may recommend a pelvic exam and a Pap test every three years. For women ages 47-65, they may recommend the Pap test and pelvic exam, combined with testing for human papilloma virus (HPV), every five years. Some types of HPV increase your risk of cervical cancer. Testing for HPV may also be done on women of any age who have unclear Pap test results.  Other health care providers may not recommend any screening for nonpregnant women who are considered low risk for pelvic cancer and have no symptoms. Ask your health care provider if a screening pelvic exam is right for you.  If you have had past treatment for cervical cancer or a condition that could lead to cancer, you need Pap tests and screening for cancer for at least 20 years after  your treatment. If Pap tests have been discontinued for you, your risk factors (such as having a new sexual partner) need to be reassessed to determine if you should start having screenings again. Some women have medical problems that increase the chance of getting cervical cancer. In these cases, your health care provider may recommend that you have screening and Pap tests more often.  If you have a family history of uterine cancer or ovarian cancer, talk with your health care provider about genetic screening.  If you have vaginal bleeding after reaching menopause, tell your health care provider.  There are currently no reliable tests available to screen for ovarian cancer. Lung Cancer Lung cancer screening is recommended for adults 78-1 years old who are at high risk for lung cancer because of a history of smoking. A yearly low-dose CT scan of the lungs is recommended if you:  Currently smoke.  Have a history of at least 30 pack-years of smoking and you currently smoke or have quit within the past 15 years. A pack-year is smoking an average of one pack of cigarettes per day for one year. Yearly screening should:  Continue until it has been 15 years since you quit.  Stop if you develop a health problem that would prevent you from having lung  cancer treatment. Colorectal Cancer  This type of cancer can be detected and can often be prevented.  Routine colorectal cancer screening usually begins at age 22 and continues through age 36.  If you have risk factors for colon cancer, your health care provider may recommend that you be screened at an earlier age.  If you have a family history of colorectal cancer, talk with your health care provider about genetic screening.  Your health care provider may also recommend using home test kits to check for hidden blood in your stool.  A small camera at the end of a tube can be used to examine your colon directly (sigmoidoscopy or colonoscopy). This  is done to check for the earliest forms of colorectal cancer.  Direct examination of the colon should be repeated every 5-10 years until age 96. However, if early forms of precancerous polyps or small growths are found or if you have a family history or genetic risk for colorectal cancer, you may need to be screened more often. Skin Cancer  Check your skin from head to toe regularly.  Monitor any moles. Be sure to tell your health care provider:  About any new moles or changes in moles, especially if there is a change in a mole's shape or color.  If you have a mole that is larger than the size of a pencil eraser.  If any of your family members has a history of skin cancer, especially at a young age, talk with your health care provider about genetic screening.  Always use sunscreen. Apply sunscreen liberally and repeatedly throughout the day.  Whenever you are outside, protect yourself by wearing long sleeves, pants, a wide-brimmed hat, and sunglasses. WHAT SHOULD I KNOW ABOUT OSTEOPOROSIS? Osteoporosis is a condition in which bone destruction happens more quickly than new bone creation. After menopause, you may be at an increased risk for osteoporosis. To help prevent osteoporosis or the bone fractures that can happen because of osteoporosis, the following is recommended:  If you are 42-50 years old, get at least 1,000 mg of calcium and at least 600 mg of vitamin D per day.  If you are older than age 91 but younger than age 58, get at least 1,200 mg of calcium and at least 600 mg of vitamin D per day.  If you are older than age 65, get at least 1,200 mg of calcium and at least 800 mg of vitamin D per day. Smoking and excessive alcohol intake increase the risk of osteoporosis. Eat foods that are rich in calcium and vitamin D, and do weight-bearing exercises several times each week as directed by your health care provider. WHAT SHOULD I KNOW ABOUT HOW MENOPAUSE AFFECTS [place]? Depression may occur at any age, but it is more common as you become older. Common symptoms of depression include:  Low or sad mood.  Changes in sleep patterns.  Changes in appetite or eating patterns.  Feeling an overall lack of motivation or enjoyment of activities that you previously enjoyed.  Frequent crying spells. Talk with your health care provider if you think that you are experiencing depression. WHAT SHOULD I KNOW ABOUT IMMUNIZATIONS? It is important that you get and maintain your immunizations. These include:  Tetanus, diphtheria, and pertussis (Tdap) booster vaccine.  Influenza every year before the flu season begins.  Pneumonia vaccine.  Shingles vaccine. Your health care provider may also recommend other immunizations.   This information is not intended to replace advice given to  you by your health care provider. Make sure you discuss any questions you have with your health care provider.   Document Released: 01/07/2006 Document Revised: 12/06/2014 Document Reviewed: 07/18/2014 Elsevier Interactive Patient Education [employer].

## 2015-12-24 NOTE — Assessment & Plan Note (Addendum)
Prior trial of ambien and restoril not tolerated.  Using alprazolam prn, not nightly, .  Proper sleep hygiene observed.  Increased stress since layoff in October.  [facility].

## 2015-12-25 ENCOUNTER — Telehealth: Payer: Self-pay

## 2015-12-25 NOTE — Telephone Encounter (Signed)
Started PA for Estrace cream on Cover my meds.

## 2015-12-26 ENCOUNTER — Telehealth: Payer: Self-pay | Admitting: *Deleted

## 2015-12-26 LAB — URINE CULTURE
Colony Count: NO GROWTH
Organism ID, Bacteria: NO GROWTH

## 2015-12-26 NOTE — Telephone Encounter (Signed)
Patient stated that her insurance company needed an explanation to why Estrace is being needed.  Contact [phone]

## 2015-12-26 NOTE — Telephone Encounter (Signed)
PA was completed on Cover My Meds yesterday, Will result in 1-3 business days. Called and left a VM for patient letting her know that the PA was started.

## 2015-12-27 ENCOUNTER — Encounter: Payer: Self-pay | Admitting: Internal Medicine

## 2015-12-27 DIAGNOSIS — R300 Dysuria: Secondary | ICD-10-CM | POA: Insufficient documentation

## 2015-12-27 DIAGNOSIS — Z0000 Encounter for general adult medical examination without abnormal findings: Secondary | ICD-10-CM | POA: Insufficient documentation

## 2015-12-27 NOTE — Assessment & Plan Note (Signed)
No ulcers, only antral eroisiosn seen in June 2016 EGD.  Continue PPI

## 2015-12-27 NOTE — Assessment & Plan Note (Signed)
Annual comprehensive preventive eExam was done as well as an evaluation and management of acute and chronic conditions .  During the course of the visit the patient was educated and counseled about appropriate screening and preventive services including :  diabetes screening, colorectal cancer screening, and recommended immunizations.  Printed recommendations for health maintenance screenings was given.

## 2015-12-27 NOTE — Progress Notes (Signed)
Patient ID: Nichole Riddle, female    DOB: 11-13-1961  Age: 55 y.o. MRN: [id number]  The patient is here for annual  wellness examination and management of other chronic and acute problems.   Colonoscopy July 2016 PAP smear normal Dec 2015   The risk factors are reflected in the social history.  The roster of all physicians providing medical care to patient - is listed in the Snapshot section of the chart.  Activities of daily living:  The patient is 100% independent in all ADLs: dressing, toileting, feeding as well as independent mobility  Home safety : The patient has smoke detectors in the home. They wear seatbelts.  There are no firearms at home. There is no violence in the home.   There is no risks for hepatitis, STDs or HIV. There is no   history of blood transfusion. They have no travel history to infectious disease endemic areas of the world.  The patient has seen their dentist in the last six month. They have seen their eye doctor in the last year. They admit to slight hearing difficulty with regard to whispered voices and some television programs.  They have deferred audiologic testing in the last year.  They do not  have excessive sun exposure. Discussed the need for sun protection: hats, long sleeves and use of sunscreen if there is significant sun exposure.   Diet: the importance of a healthy diet is discussed. They do have a healthy diet.  The benefits of regular aerobic exercise were discussed. She is not exercising   Depression screen: there are no signs or vegative symptoms of depression- irritability, change in appetite, anhedonia, sadness/tearfullness.  Cognitive assessment: the patient manages all their financial and personal affairs and is actively engaged. They could relate day,date,year and events; recalled 2/3 objects at 3 minutes; performed clock-face test normally.  The following portions of the patient's history were reviewed and updated as appropriate: allergies,  current medications, past family history, past medical history,  past surgical history, past social history  and problem list.  Visual acuity was not assessed per patient preference since she has regular follow up with her ophthalmologist. Hearing and body mass index were assessed and reviewed.   During the course of the visit the patient was educated and counseled about appropriate screening and preventive services including : fall prevention , diabetes screening, nutrition counseling, colorectal cancer screening, and recommended immunizations.    CC: The primary encounter diagnosis was Breast cancer screening. Diagnoses of Insomnia, Cystitis, History of gastric ulcer, IBS (irritable bowel syndrome), Dysuria, Rectal bleeding, Hyperlipidemia with target LDL less than 130, Visit for preventive health examination, and Encounter for preventive health examination were also pertinent to this visit.  2) follow up on ER visit jan 23 for suprapubic pain and UA concerning for cystitis., less likley stone,  no imaging done.   Was treated with IV toradol, fluids and Zofran.  given rx for bactrim and one Percocet which made her sick, so she called office on same day asking for alternative pain meds.. Tramadol prescribed 1/23. She has not been tolerating  septra due to nausea.  Switching to cipro   She has had Vaginal itching for the past 2 weeks without discharge. .  There have been no changes in soaps detergents or lotions.  Only douches  with refresh , last time a week ago.  She was prescribed Estrace cream by her gynecologist but ran out of it 6 months ago .  2) IBS with chronic  abdominal  pain mostly postprandial, and diarrhea.  Self referred to Nichole Riddle for  2nd opinion.  Wohl changed levbid to bentyl. Sent her for HIDA scan and [place] which were normal.   Colonoscopy in July was  normal except for polyps.  Previously had undergone an EGD which noted small antral erosions in June 2016.  Biopsies were all  negative.   Her abdominal pain has largely resolved which she attributes to use of Bentyl and getting laid off by Hospice.  She was planning to quit anyway.     History Nichole Riddle has a past medical history of Gastric ulcer (2012); History of cardiac catheterization (2012); Gastric ulcer due to Helicobacter pylori (0000000); Irritable bowel; Migraines; HLD (hyperlipidemia); Multiple gastric ulcers; Anxiety (1997); Depression (1997); Bulimia (1997); and IBS (irritable bowel syndrome) (06/17/2015).   She has past surgical history that includes Abdominal hysterectomy; Esophagogastroduodenoscopy; and Colonoscopy.   Her family history includes Celiac disease in her brother; Esophageal cancer in her maternal grandmother; Heart disease (age of onset: 60) in her brother; Heart disease (age of onset: 93) in her father; Irritable bowel syndrome in her mother; Stomach cancer in her maternal grandmother; Ulcerative colitis in her mother.She reports that she has never smoked. She has never used smokeless tobacco. She reports that she drinks about 1.2 oz of alcohol per week. She reports that she does not use illicit drugs.  Outpatient Prescriptions Prior to Visit  Medication Sig Dispense Refill  . albuterol (PROVENTIL HFA;VENTOLIN HFA) 108 (90 BASE) MCG/ACT inhaler Inhale 2 puffs into the lungs every 6 (six) hours as needed for wheezing or shortness of breath. 1 Inhaler 2  . ondansetron (ZOFRAN ODT) 8 MG disintegrating tablet Take 1 tablet (8 mg total) by mouth every 8 (eight) hours as needed for nausea or vomiting. 20 tablet 0  . ondansetron (ZOFRAN) 4 MG tablet Take 1 tablet (4 mg total) by mouth daily as needed for nausea or vomiting. 30 tablet 1  . rizatriptan (MAXALT-MLT) 10 MG disintegrating tablet DISSOLVE 1 TABLET ON THE TONGUE AS NEEDED FOR MIGRAINE, MAY REPEAT IN 2 HOURS IF NEEDED 10 tablet 3  . traMADol (ULTRAM) 50 MG tablet Take 1 tablet (50 mg total) by mouth every 8 (eight) hours as needed. 30 tablet 0   . ALPRAZolam (XANAX) 0.5 MG tablet TAKE 1 TABLET BY MOUTH EVERY NIGHT AT BEDTIME AS NEEDED FOR ANXIETY 30 tablet 1  . sulfamethoxazole-trimethoprim (BACTRIM DS) 800-160 MG tablet Take 1 tablet by mouth 2 (two) times daily. 14 tablet 0  . benzonatate (TESSALON) 200 MG capsule Take 1 capsule (200 mg total) by mouth 3 (three) times daily as needed for cough. (Patient not taking: Reported on 12/22/2015) 20 capsule 0  . dicyclomine (BENTYL) 20 MG tablet Take 1 tablet (20 mg total) by mouth 3 (three) times daily before meals. (Patient not taking: Reported on 12/22/2015) 90 tablet 3  . hyoscyamine (LEVBID) 0.375 MG 12 hr tablet Take 1 tablet (0.375 mg total) by mouth 2 (two) times daily. (Patient not taking: Reported on 12/22/2015) 60 tablet 5  . omeprazole (PRILOSEC) 40 MG capsule Take 1 capsule by mouth before breakfast daily. (Patient not taking: Reported on 12/22/2015) 90 capsule 3   No facility-administered medications prior to visit.    Review of Systems   Patient denies headache, fevers, malaise, unintentional weight loss, skin rash, eye pain, sinus congestion and sinus pain, sore throat, dysphagia,  hemoptysis , cough, dyspnea, wheezing, chest pain, palpitations, orthopnea, edema, abdominal pain, nausea, melena, diarrhea,  constipation, flank pain, dysuria, hematuria, urinary  Frequency, nocturia, numbness, tingling, seizures,  Focal weakness, Loss of consciousness,  Tremor, insomnia, depression, anxiety, and suicidal ideation.      Objective:  BP 106/62 mmHg  Pulse 72  Temp(Src) 98.1 F (36.7 C) (Oral)  Resp 12  Ht 5\' 3"  (1.6 m)  Wt 166 lb (75.297 kg)  BMI 29.41 kg/m2  SpO2 99%  Physical Exam   General Appearance:    Alert, cooperative, no distress, appears stated age  Head:    Normocephalic, without obvious abnormality, atraumatic  Eyes:    PERRL, conjunctiva/corneas clear, EOM's intact, fundi    benign, both eyes  Ears:    Normal TM's and external ear canals, both ears  Nose:    Nares normal, septum midline, mucosa normal, no drainage    or sinus tenderness  Throat:   Lips, mucosa, and tongue normal; teeth and gums normal  Neck:   Supple, symmetrical, trachea midline, no adenopathy;    thyroid:  no enlargement/tenderness/nodules; no carotid   bruit or JVD  Back:     Symmetric, no curvature, ROM normal, no CVA tenderness  Lungs:     Clear to auscultation bilaterally, respirations unlabored  Chest Wall:    No tenderness or deformity   Heart:    Regular rate and rhythm, S1 and S2 normal, no murmur, rub   or gallop  Breast Exam:    No tenderness, masses, or nipple abnormality  Abdomen:     Soft, non-tender, bowel sounds active all four quadrants,    no masses, no organomegaly  Genitalia:    Pelvic: cervix normal in appearance, external genitalia normal, no adnexal masses or tenderness, no cervical motion tenderness, rectovaginal septum normal, uterus normal size, shape, and consistency and vagina normal without discharge  Extremities:   Extremities normal, atraumatic, no cyanosis or edema  Pulses:   2+ and symmetric all extremities  Skin:   Skin color, texture, turgor normal, no rashes or lesions  Lymph nodes:   Cervical, supraclavicular, and axillary nodes normal  Neurologic:   CNII-XII intact, normal strength, sensation and reflexes    throughout       Assessment & Plan:   Problem List Items Addressed This Visit    History of gastric ulcer    No ulcers, only antral eroisiosn seen in June 2016 EGD.  Continue PPI       Hyperlipidemia with target LDL less than 130    Untreated due to statin intolerance Lab Results  Component Value Date   CHOL 254* 10/15/2013   HDL 49.20 10/15/2013   LDLDIRECT 185.6 10/15/2013   TRIG 202.0* 10/15/2013   CHOLHDL 5 10/15/2013           Encounter for preventive health examination    Annual comprehensive preventive eExam was done as well as an evaluation and management of acute and chronic conditions .  During the course  of the visit the patient was educated and counseled about appropriate screening and preventive services including :  diabetes screening, colorectal cancer screening, and recommended immunizations.  Printed recommendations for health maintenance screenings was given.       Rectal bleeding    Rectal exam was notable for internal hemorrhoids and was heme positive. Suggested that symptoms were due to constipation induced hemorrhoidal bleeding.   Lab Results  Component Value Date   WBC 11.9* 12/22/2015   HGB 12.7 12/22/2015   HCT 39.6 12/22/2015   MCV 82.4 12/22/2015   PLT 274 12/22/2015  Lab Results  Component Value Date   INR 1.0 11/08/2014           IBS (irritable bowel syndrome)    Diagnosis confirmed by second opinion (Wohl) .  Symptoms improved with Bentyl and decreased stress.       Dysuria    UTI partially treated with Bactrim but not tolerated, changed to cipro.  Repeat UA and culture is negative.       Visit for preventive health examination   Insomnia    Prior trial of ambien and restoril not tolerated.  Using alprazolam prn, not nightly, .  Proper sleep hygiene observed.  Increased stress since layoff in October.  From Hospice.         Other Visit Diagnoses    Breast cancer screening    -  Primary    Relevant Orders    MM DIGITAL SCREENING BILATERAL    Cystitis        Relevant Orders    Urine culture (Completed)       I have discontinued Ms. Howk's omeprazole, hyoscyamine, dicyclomine, benzonatate, and sulfamethoxazole-trimethoprim. I am also having her start on ciprofloxacin, fluconazole, and estradiol. Additionally, I am having her maintain her ondansetron, rizatriptan, albuterol, ondansetron, traMADol, and ALPRAZolam.  Meds ordered this encounter  Medications  . ciprofloxacin (CIPRO) 250 MG tablet    Sig: Take 1 tablet (250 mg total) by mouth 2 (two) times daily.    Dispense:  10 tablet    Refill:  0  . fluconazole (DIFLUCAN) 150 MG tablet    Sig:  Take 1 tablet (150 mg total) by mouth daily.    Dispense:  2 tablet    Refill:  0  . ALPRAZolam (XANAX) 0.5 MG tablet    Sig: TAKE 1 TABLET BY MOUTH EVERY NIGHT AT BEDTIME AS NEEDED FOR ANXIETY    Dispense:  30 tablet    Refill:  5    Not to exceed 4 additional fills before 03/14/2016  . estradiol (ESTRACE VAGINAL) 0.1 MG/GM vaginal cream    Sig: Place 1 Applicatorful vaginally at bedtime.    Dispense:  42.5 g    Refill:  12    Medications Discontinued During This Encounter  Medication Reason  . omeprazole (PRILOSEC) 40 MG capsule Error  . hyoscyamine (LEVBID) 0.375 MG 12 hr tablet Error  . dicyclomine (BENTYL) 20 MG tablet Error  . benzonatate (TESSALON) 200 MG capsule Error  . sulfamethoxazole-trimethoprim (BACTRIM DS) 800-160 MG tablet Side effect (s)  . ALPRAZolam (XANAX) 0.5 MG tablet Reorder    Follow-up: No Follow-up on file.   Crecencio Mc, MD

## 2015-12-27 NOTE — Assessment & Plan Note (Signed)
Rectal exam was notable for internal hemorrhoids and was heme positive. Suggested that symptoms were due to constipation induced hemorrhoidal bleeding.   Lab Results  Component Value Date   WBC 11.9* 12/22/2015   HGB 12.7 12/22/2015   HCT 39.6 12/22/2015   MCV 82.4 12/22/2015   PLT 274 12/22/2015   Lab Results  Component Value Date   INR 1.0 11/08/2014

## 2015-12-27 NOTE — Assessment & Plan Note (Signed)
UTI partially treated with Bactrim but not tolerated, changed to cipro.  Repeat UA and culture is negative.

## 2015-12-27 NOTE — Assessment & Plan Note (Signed)
Untreated due to statin intolerance Lab Results  Component Value Date   CHOL 254* 10/15/2013   HDL 49.20 10/15/2013   LDLDIRECT 185.6 10/15/2013   TRIG 202.0* 10/15/2013   CHOLHDL 5 10/15/2013

## 2015-12-27 NOTE — Assessment & Plan Note (Signed)
Diagnosis confirmed by second opinion Nichole Riddle) .  Symptoms improved with Bentyl and decreased stress.

## 2016-01-01 MED ORDER — ESTROGENS, CONJUGATED 0.625 MG/GM VA CREA: 1.0000 | TOPICAL_CREAM | VAGINAL | Status: DC

## 2016-01-01 NOTE — Addendum Note (Signed)
Addended by: Crecencio Mc on: 01/01/2016 01:10 PM   Modules accepted: Orders

## 2016-01-01 NOTE — Telephone Encounter (Signed)
Sending  rx for Premarin vaginal cream.  Tier 3 per EPIC, please notify patient of the change and why

## 2016-01-01 NOTE — Telephone Encounter (Signed)
Spoke with the patient, she verbalized understanding and will pick up the Premarin at the pharmacy.  thanks

## 2016-01-01 NOTE — Telephone Encounter (Signed)
PA for Estrace was denied, please advise?

## 2016-01-08 ENCOUNTER — Ambulatory Visit (INDEPENDENT_AMBULATORY_CARE_PROVIDER_SITE_OTHER): Payer: BLUE CROSS/BLUE SHIELD | Admitting: Psychology

## 2016-01-08 DIAGNOSIS — F331 Major depressive disorder, recurrent, moderate: Secondary | ICD-10-CM

## 2016-01-22 ENCOUNTER — Ambulatory Visit (INDEPENDENT_AMBULATORY_CARE_PROVIDER_SITE_OTHER): Payer: BLUE CROSS/BLUE SHIELD | Admitting: Psychology

## 2016-01-22 DIAGNOSIS — F331 Major depressive disorder, recurrent, moderate: Secondary | ICD-10-CM | POA: Diagnosis not present

## 2016-01-23 ENCOUNTER — Ambulatory Visit
Admission: RE | Admit: 2016-01-23 | Discharge: 2016-01-23 | Disposition: A | Discharge status: Home / Self Care | Payer: BLUE CROSS/BLUE SHIELD | Source: Ambulatory Visit | Attending: Internal Medicine | Admitting: Internal Medicine

## 2016-01-23 DIAGNOSIS — Z1239 Encounter for other screening for malignant neoplasm of breast: Secondary | ICD-10-CM

## 2016-01-30 ENCOUNTER — Ambulatory Visit: Payer: BLUE CROSS/BLUE SHIELD | Admitting: Psychology

## 2016-02-04 LAB — HM MAMMOGRAPHY

## 2016-02-09 ENCOUNTER — Telehealth: Payer: Self-pay | Admitting: Internal Medicine

## 2016-02-09 MED ORDER — ESTRADIOL 0.1 MG/GM VA CREA: 1.0000 | TOPICAL_CREAM | Freq: Every day | VAGINAL | Status: DC

## 2016-02-09 NOTE — Telephone Encounter (Signed)
Insurance didn't approve the Estrace last time as she had not tried the alternatives. Per the patient the alternative is not working,  Per the patient she would like the estrace called in and a PA completed.  Please advise

## 2016-02-09 NOTE — Telephone Encounter (Signed)
Pt called about wanting a prescription for estradiol (ESTRACE VAGINAL) 0.1 MG/GM vaginal cream. Pt tried the other medication first that was suggested and it did not work. Pharmacy is [id number] [place], [place] - [address]. Pt states she needs the PA. Call pt @ [phone]. Thank you!

## 2016-02-09 NOTE — Telephone Encounter (Signed)
Start the PA process for Estrace .  The alternative  medication was Premarin.

## 2016-02-10 NOTE — Telephone Encounter (Signed)
Resubmitted PA for Estrace cream on cover my meds.

## 2016-02-26 NOTE — Telephone Encounter (Signed)
Approval for Estrace cream from 02/10/2016-11/28/2038.

## 2016-03-08 ENCOUNTER — Ambulatory Visit (INDEPENDENT_AMBULATORY_CARE_PROVIDER_SITE_OTHER): Payer: BLUE CROSS/BLUE SHIELD | Admitting: Internal Medicine

## 2016-03-08 ENCOUNTER — Encounter: Payer: Self-pay | Admitting: Internal Medicine

## 2016-03-08 VITALS — BP 108/76 | HR 75 | Temp 97.9°F | Resp 12 | Ht 63.0 in | Wt 166.5 lb

## 2016-03-08 DIAGNOSIS — T148XXA Other injury of unspecified body region, initial encounter: Secondary | ICD-10-CM

## 2016-03-08 DIAGNOSIS — D72829 Elevated white blood cell count, unspecified: Secondary | ICD-10-CM

## 2016-03-08 DIAGNOSIS — G4700 Insomnia, unspecified: Secondary | ICD-10-CM

## 2016-03-08 DIAGNOSIS — Z7289 Other problems related to lifestyle: Secondary | ICD-10-CM

## 2016-03-08 DIAGNOSIS — E785 Hyperlipidemia, unspecified: Secondary | ICD-10-CM

## 2016-03-08 DIAGNOSIS — R5383 Other fatigue: Secondary | ICD-10-CM

## 2016-03-08 DIAGNOSIS — F418 Other specified anxiety disorders: Secondary | ICD-10-CM

## 2016-03-08 DIAGNOSIS — R233 Spontaneous ecchymoses: Secondary | ICD-10-CM

## 2016-03-08 DIAGNOSIS — F419 Anxiety disorder, unspecified: Principal | ICD-10-CM

## 2016-03-08 DIAGNOSIS — F329 Major depressive disorder, single episode, unspecified: Secondary | ICD-10-CM

## 2016-03-08 DIAGNOSIS — F32A Depression, unspecified: Secondary | ICD-10-CM

## 2016-03-08 MED ORDER — CLONAZEPAM 0.5 MG PO TABS: 0.5000 mg | ORAL_TABLET | Freq: Two times a day (BID) | ORAL | Status: DC | PRN

## 2016-03-08 MED ORDER — TRAZODONE HCL 50 MG PO TABS: 25.0000 mg | ORAL_TABLET | Freq: Every evening | ORAL | Status: DC | PRN

## 2016-03-08 MED ORDER — VENLAFAXINE HCL ER 37.5 MG PO CP24: 37.5000 mg | ORAL_CAPSULE | Freq: Every day | ORAL | Status: DC

## 2016-03-08 NOTE — Progress Notes (Signed)
Subjective:  Patient ID: Nichole Riddle, female    DOB: [birth year]  Age: 55 y.o. MRN: [id number]  CC: The primary encounter diagnosis was Anxiety and depression. Diagnoses of Leukocytosis, Hyperlipidemia, Other problems related to lifestyle, Other fatigue, Insomnia, and Bruising were also pertinent to this visit.  HPI Nichole Riddle presents for persistent insomnia despite taking alprazolam at night.  Waking up afer 3-4 hours .  Cites multiple financial stressors.  Unemployed,  Mother has been ill.  Boyfriend has had some truck troubles.    Prior trial of Paxil caused agitation .   Stopped after one week .  Has tried trazodone in the distant past ,  Did not titrate dose.  Personality change due to persistent anxiety   Has noticed spontaneous bruising on extremities and face,  But no gums bleeding.  Not taking asppirin.   Outpatient Prescriptions Prior to Visit  Medication Sig Dispense Refill  . albuterol (PROVENTIL HFA;VENTOLIN HFA) 108 (90 BASE) MCG/ACT inhaler Inhale 2 puffs into the lungs every 6 (six) hours as needed for wheezing or shortness of breath. 1 Inhaler 2  . ALPRAZolam (XANAX) 0.5 MG tablet TAKE 1 TABLET BY MOUTH EVERY NIGHT AT BEDTIME AS NEEDED FOR ANXIETY 30 tablet 5  . estradiol (ESTRACE VAGINAL) 0.1 MG/GM vaginal cream Place 1 Applicatorful vaginally at bedtime. FOR 2 WEEKS,  Then reduce use to twice weekly 42.5 g 12  . ondansetron (ZOFRAN ODT) 8 MG disintegrating tablet Take 1 tablet (8 mg total) by mouth every 8 (eight) hours as needed for nausea or vomiting. 20 tablet 0  . ondansetron (ZOFRAN) 4 MG tablet Take 1 tablet (4 mg total) by mouth daily as needed for nausea or vomiting. 30 tablet 1  . rizatriptan (MAXALT-MLT) 10 MG disintegrating tablet DISSOLVE 1 TABLET ON THE TONGUE AS NEEDED FOR MIGRAINE, MAY REPEAT IN 2 HOURS IF NEEDED 10 tablet 3  . fluconazole (DIFLUCAN) 150 MG tablet Take 1 tablet (150 mg total) by mouth daily. (Patient not taking: Reported on 03/08/2016) 2  tablet 0  . traMADol (ULTRAM) 50 MG tablet Take 1 tablet (50 mg total) by mouth every 8 (eight) hours as needed. (Patient not taking: Reported on 03/08/2016) 30 tablet 0  . ciprofloxacin (CIPRO) 250 MG tablet Take 1 tablet (250 mg total) by mouth 2 (two) times daily. 10 tablet 0  . conjugated estrogens (PREMARIN) vaginal cream Place 1 Applicatorful vaginally 2 (two) times a week. (Patient not taking: Reported on 03/08/2016) 42.5 g 12   No facility-administered medications prior to visit.    Review of Systems;  Patient denies headache, fevers, malaise, unintentional weight loss, skin rash, eye pain, sinus congestion and sinus pain, sore throat, dysphagia,  hemoptysis , cough, dyspnea, wheezing, chest pain, palpitations, orthopnea, edema, abdominal pain, nausea, melena, diarrhea, constipation, flank pain, dysuria, hematuria, urinary  Frequency, nocturia, numbness, tingling, seizures,  Focal weakness, Loss of consciousness,  Tremor, , depression, anxiety, and suicidal ideation.      Objective:  BP 108/76 mmHg  Pulse 75  Temp(Src) 97.9 F (36.6 C) (Oral)  Resp 12  Ht 5\' 3"  (1.6 m)  Wt 166 lb 8 oz (75.524 kg)  BMI 29.50 kg/m2  SpO2 98%  BP Readings from Last 3 Encounters:  03/08/16 108/76  12/24/15 106/62  12/22/15 122/85    Wt Readings from Last 3 Encounters:  03/08/16 166 lb 8 oz (75.524 kg)  12/24/15 166 lb (75.297 kg)  12/22/15 165 lb (74.844 kg)    General appearance: alert, cooperative and  appears stated age Ears: normal TM's and external ear canals both ears Throat: lips, mucosa, and tongue normal; teeth and gums normal Neck: no adenopathy, no carotid bruit, supple, symmetrical, trachea midline and thyroid not enlarged, symmetric, no tenderness/mass/nodules Back: symmetric, no curvature. ROM normal. No CVA tenderness. Lungs: clear to auscultation bilaterally Heart: regular rate and rhythm, S1, S2 normal, no murmur, click, rub or gallop Abdomen: soft, non-tender; bowel  sounds normal; no masses,  no organomegaly Pulses: 2+ and symmetric Skin: several resolving ecchymoses. Skin color, texture, turgor normal. No rashes or lesions Lymph nodes: Cervical, supraclavicular, and axillary nodes normal.  Lab Results  Component Value Date   HGBA1C 5.8 10/25/2012    Lab Results  Component Value Date   CREATININE 0.93 03/08/2016   CREATININE 0.77 12/22/2015   CREATININE 1.00 03/31/2015    Lab Results  Component Value Date   WBC 5.3 03/08/2016   HGB 13.0 03/08/2016   HCT 39.6 03/08/2016   PLT 256.0 03/08/2016   GLUCOSE 105* 03/08/2016   CHOL 376* 03/08/2016   TRIG 232.0* 03/08/2016   HDL 54.80 03/08/2016   LDLDIRECT 265.0 03/08/2016   ALT 15 03/08/2016   AST 18 03/08/2016   NA 140 03/08/2016   K 3.9 03/08/2016   CL 103 03/08/2016   CREATININE 0.93 03/08/2016   BUN 13 03/08/2016   CO2 29 03/08/2016   TSH 2.580 05/14/2014   INR 1.0 11/08/2014   HGBA1C 5.8 10/25/2012   MICROALBUR 0.50 03/09/2013    No results found.  Assessment & Plan:   Problem List Items Addressed This Visit    Anxiety and depression - Primary    Adding Effexor starting at 37.5 mg   Titrate to 75 nmg after 2 weeks.      Add clonazepam up to 2 times daily for anxiety        Insomnia    Stopping alprazolam,  Starting trazodone. Titrate up to 100 mg daily       Bruising    She has no bleeding of gums.  Reassurance provided.   Lab Results  Component Value Date   WBC 5.3 03/08/2016   HGB 13.0 03/08/2016   HCT 39.6 03/08/2016   MCV 83.2 03/08/2016   PLT 256.0 03/08/2016          Other Visit Diagnoses    Leukocytosis        Relevant Orders    CBC with Differential/Platelet (Completed)    Hyperlipidemia        Relevant Orders    Lipid panel (Completed)    Other problems related to lifestyle        Relevant Orders    HCV RNA quant    HIV antibody    Other fatigue        Relevant Orders    Comprehensive metabolic panel (Completed)       I have  discontinued Ms. Dorame's ciprofloxacin and conjugated estrogens. I am also having her start on venlafaxine XR, traZODone, and clonazePAM. Additionally, I am having her maintain her ondansetron, rizatriptan, albuterol, ondansetron, traMADol, fluconazole, ALPRAZolam, and estradiol.  Meds ordered this encounter  Medications  . venlafaxine XR (EFFEXOR XR) 37.5 MG 24 hr capsule    Sig: Take 1 capsule (37.5 mg total) by mouth daily with breakfast. Increase to 75 mg after two weeks    Dispense:  60 capsule    Refill:  2  . traZODone (DESYREL) 50 MG tablet    Sig: Take 0.5-1 tablets (25-50 mg total)  by mouth at bedtime as needed for sleep.    Dispense:  90 tablet    Refill:  3  . clonazePAM (KLONOPIN) 0.5 MG tablet    Sig: Take 1 tablet (0.5 mg total) by mouth 2 (two) times daily as needed for anxiety.    Dispense:  60 tablet    Refill:  1    Medications Discontinued During This Encounter  Medication Reason  . ciprofloxacin (CIPRO) 250 MG tablet Completed Course  . conjugated estrogens (PREMARIN) vaginal cream Change in therapy    Follow-up: Return in about 4 weeks (around 04/05/2016).   Crecencio Mc, MD

## 2016-03-08 NOTE — Progress Notes (Signed)
Pre-visit discussion using our clinic review tool. No additional management support is needed unless otherwise documented below in the visit note.  

## 2016-03-08 NOTE — Patient Instructions (Addendum)
We are treating your anxiety and insomnia today   Trazodone :  Take 1 hour before bedtime, starting at 25 mg . You can titrate up to 100 mg graduallly ,  If tolerated  Effexor starting at 37.5 mg daily , after 2 weeks,  Increase to 75 mg daily  Clonazepam 0.5 mg up to twice daily IF NEEDED   Here are the names of several well respected female therapists:    Karen San Marino   [phone] [place] [phone]  [place]  Interstitial Cystitis Interstitial cystitis is a condition that causes inflammation of the bladder. The bladder is a hollow organ in the lower part of your abdomen. It stores urine after the urine is made by your kidneys. With interstitial cystitis, you may have pain in the bladder area. You may also have a frequent and urgent need to urinate. The severity of interstitial cystitis can vary from person to person. You may have flare-ups of the condition, and then it may go away for a while. For many people who have this condition, it becomes a long-term problem. CAUSES The cause of this condition is not known. RISK FACTORS This condition is more likely to develop in women. SYMPTOMS Symptoms of interstitial cystitis vary, and they can change over time. Symptoms may include:  Discomfort or pain in the bladder area. This can range from mild to severe. The pain may change in intensity as the bladder fills with urine or as it empties.  Pelvic pain.  An urgent need to urinate.  Frequent urination.  Pain during sexual intercourse.  Pinpoint bleeding on the bladder wall. For women, the symptoms often get worse during menstruation. DIAGNOSIS This condition is diagnosed by evaluating your symptoms and ruling out other causes. A physical exam will be done. Various tests may be done to rule out other conditions. Common tests include:  Urine tests.  Cystoscopy. In this test, a tool that is like a very thin telescope is used to look into your  bladder.  Biopsy. This involves taking a sample of tissue from the bladder wall to be examined under a microscope. TREATMENT There is no cure for interstitial cystitis, but treatment methods are available to control your symptoms. Work closely with your health care provider to find the treatments that will be most effective for you. Treatment options may include:  Medicines to relieve pain and to help reduce the number of times that you feel the need to urinate.  Bladder training. This involves learning ways to control when you urinate, such as:  Urinating at scheduled times.  Training yourself to delay urination.  Doing exercises (Kegel exercises) to strengthen the muscles that control urine flow.  Lifestyle changes, such as changing your diet or taking steps to control stress.  Use of a device that provides electrical stimulation in order to reduce pain.  A procedure that stretches your bladder by filling it with air or fluid.  Surgery. This is rare. It is only done for extreme cases if other treatments do not help. HOME CARE INSTRUCTIONS  Take medicines only as directed by your health care provider.  Use bladder training techniques as directed.  Keep a bladder diary to find out which foods, liquids, or activities make your symptoms worse.  Use your bladder diary to schedule bathroom trips. If you are away from home, plan to be near a bathroom at each of your scheduled times.  Make sure you urinate just before you leave the house and  just before you go to bed.  Do Kegel exercises as directed by your health care provider.  Do not drink alcohol.  Do not use any tobacco products, including cigarettes, chewing tobacco, or electronic cigarettes. If you need help quitting, ask your health care provider.  Make dietary changes as directed by your health care provider. You may need to avoid spicy foods and foods that contain a high amount of potassium.  Limit your drinking of  beverages that stimulate urination. These include soda, coffee, and tea.  Keep all follow-up visits as directed by your health care provider. This is important. SEEK MEDICAL CARE IF:  Your symptoms do not get better after treatment.  Your pain and discomfort are getting worse.  You have more frequent urges to urinate.  You have a fever. SEEK IMMEDIATE MEDICAL CARE IF:  You are not able to control your bladder at all.   This information is not intended to replace advice given to you by your health care provider. Make sure you discuss any questions you have with your health care provider.   Document Released: 07/16/2004 Document Revised: 12/06/2014 Document Reviewed: 07/23/2014 Elsevier Interactive Patient Education [employer].

## 2016-03-08 NOTE — Assessment & Plan Note (Addendum)
Adding Effexor starting at 37.5 mg   Titrate to 75 nmg after 2 weeks.      Add clonazepam up to 2 times daily for anxiety

## 2016-03-09 DIAGNOSIS — T148XXA Other injury of unspecified body region, initial encounter: Secondary | ICD-10-CM | POA: Insufficient documentation

## 2016-03-09 LAB — LIPID PANEL
CHOL/HDL RATIO: 7
CHOLESTEROL: 376 mg/dL — AB (ref 0–200)
HDL: 54.8 mg/dL (ref >39.00)
NonHDL: 320.81
TRIGLYCERIDES: 232 mg/dL — AB (ref 0.0–149.0)
VLDL: 46.4 mg/dL — AB (ref 0.0–40.0)

## 2016-03-09 LAB — COMPREHENSIVE METABOLIC PANEL
ALBUMIN: 4.5 g/dL (ref 3.5–5.2)
ALT: 15 U/L (ref 0–35)
AST: 18 U/L (ref 0–37)
Alkaline Phosphatase: 74 U/L (ref 39–117)
BUN: 13 mg/dL (ref 6–23)
CALCIUM: 9.7 mg/dL (ref 8.4–10.5)
CHLORIDE: 103 meq/L (ref 96–112)
CO2: 29 meq/L (ref 19–32)
Creatinine, Ser: 0.93 mg/dL (ref 0.40–1.20)
GFR: 66.04 mL/min (ref >60.00)
Glucose, Bld: 105 mg/dL — ABNORMAL HIGH (ref 70–99)
POTASSIUM: 3.9 meq/L (ref 3.5–5.1)
Sodium: 140 mEq/L (ref 135–145)
Total Bilirubin: 0.2 mg/dL (ref 0.2–1.2)
Total Protein: 7.2 g/dL (ref 6.0–8.3)

## 2016-03-09 LAB — CBC WITH DIFFERENTIAL/PLATELET
BASOS PCT: 0.4 % (ref 0.0–3.0)
Basophils Absolute: 0 10*3/uL (ref 0.0–0.1)
EOS ABS: 0.1 10*3/uL (ref 0.0–0.7)
EOS PCT: 1.2 % (ref 0.0–5.0)
HEMATOCRIT: 39.6 % (ref 36.0–46.0)
HEMOGLOBIN: 13 g/dL (ref 12.0–15.0)
LYMPHS PCT: 42.7 % (ref 12.0–46.0)
Lymphs Abs: 2.2 10*3/uL (ref 0.7–4.0)
MCHC: 33 g/dL (ref 30.0–36.0)
MCV: 83.2 fl (ref 78.0–100.0)
MONOS PCT: 3.7 % (ref 3.0–12.0)
Monocytes Absolute: 0.2 10*3/uL (ref 0.1–1.0)
NEUTROS ABS: 2.7 10*3/uL (ref 1.4–7.7)
Neutrophils Relative %: 52 % (ref 43.0–77.0)
PLATELETS: 256 10*3/uL (ref 150.0–400.0)
RBC: 4.76 Mil/uL (ref 3.87–5.11)
RDW: 13.9 % (ref 11.5–15.5)
WBC: 5.3 10*3/uL (ref 4.0–10.5)

## 2016-03-09 LAB — LDL CHOLESTEROL, DIRECT: LDL DIRECT: 265 mg/dL

## 2016-03-09 LAB — HIV ANTIBODY (ROUTINE TESTING W REFLEX): HIV 1&2 Ab, 4th Generation: NONREACTIVE

## 2016-03-09 NOTE — Assessment & Plan Note (Signed)
She has no bleeding of gums.  Reassurance provided.   Lab Results  Component Value Date   WBC 5.3 03/08/2016   HGB 13.0 03/08/2016   HCT 39.6 03/08/2016   MCV 83.2 03/08/2016   PLT 256.0 03/08/2016

## 2016-03-09 NOTE — Assessment & Plan Note (Signed)
Stopping alprazolam,  Starting trazodone. Titrate up to 100 mg daily

## 2016-03-10 ENCOUNTER — Encounter: Payer: Self-pay | Admitting: Internal Medicine

## 2016-03-11 ENCOUNTER — Encounter: Payer: Self-pay | Admitting: Internal Medicine

## 2016-03-15 ENCOUNTER — Encounter: Payer: Self-pay | Admitting: Internal Medicine

## 2016-03-15 ENCOUNTER — Telehealth: Payer: Self-pay | Admitting: Internal Medicine

## 2016-03-15 DIAGNOSIS — E785 Hyperlipidemia, unspecified: Secondary | ICD-10-CM

## 2016-03-15 DIAGNOSIS — Z79899 Other long term (current) drug therapy: Secondary | ICD-10-CM

## 2016-03-15 MED ORDER — ROSUVASTATIN CALCIUM 10 MG PO TABS: 10.0000 mg | ORAL_TABLET | Freq: Every day | ORAL | Status: DC

## 2016-03-15 NOTE — Telephone Encounter (Signed)
Pt called she just found out her mom as coronary artery disease. Pt had ask the cardiologist and he stated that it's hereditary and she needs to go back the crestor medication. Pharmacy is [id number] [place], [place] - [address]. Call pt @ [phone]. Thank you!

## 2016-03-15 NOTE — Telephone Encounter (Signed)
Please advise and restart if needed. thanks

## 2016-03-15 NOTE — Telephone Encounter (Signed)
[facility]  She will need to return for fasting labs on or around June 1

## 2016-03-15 NOTE — Telephone Encounter (Signed)
Spoke with the patient, and scheduled follow up labs for June 7th, 2017

## 2016-03-15 NOTE — Assessment & Plan Note (Addendum)
Patient has decided to resume Crestor given mother's recent diagnosis of CAD.   Lab Results  Component Value Date   CHOL 376* 03/08/2016   HDL 54.80 03/08/2016   LDLDIRECT 265.0 03/08/2016   TRIG 232.0* 03/08/2016   CHOLHDL 7 03/08/2016   No results found for: [facility]

## 2016-03-31 ENCOUNTER — Ambulatory Visit: Payer: Self-pay | Admitting: Internal Medicine

## 2016-03-31 LAB — HCV RNA QUANT: HEPATITIS C QUANTITATION: NOT DETECTED [IU]/mL

## 2016-04-02 ENCOUNTER — Other Ambulatory Visit: Payer: Self-pay

## 2016-04-02 MED ORDER — VENLAFAXINE HCL ER 37.5 MG PO CP24: 37.5000 mg | ORAL_CAPSULE | Freq: Every day | ORAL | Status: DC

## 2016-04-02 NOTE — Telephone Encounter (Signed)
Received request from pharmacy. Rx has been sent. [facility]

## 2016-04-16 ENCOUNTER — Encounter: Payer: Self-pay | Admitting: Internal Medicine

## 2016-05-05 ENCOUNTER — Other Ambulatory Visit: Payer: Self-pay

## 2016-05-10 ENCOUNTER — Encounter: Payer: Self-pay | Admitting: Internal Medicine

## 2016-05-10 ENCOUNTER — Other Ambulatory Visit (INDEPENDENT_AMBULATORY_CARE_PROVIDER_SITE_OTHER): Payer: BLUE CROSS/BLUE SHIELD

## 2016-05-10 ENCOUNTER — Ambulatory Visit (INDEPENDENT_AMBULATORY_CARE_PROVIDER_SITE_OTHER): Payer: BLUE CROSS/BLUE SHIELD | Admitting: Internal Medicine

## 2016-05-10 VITALS — BP 136/78 | HR 82 | Temp 98.1°F | Resp 12 | Ht 63.0 in | Wt 163.5 lb

## 2016-05-10 DIAGNOSIS — S62501D Fracture of unspecified phalanx of right thumb, subsequent encounter for fracture with routine healing: Secondary | ICD-10-CM

## 2016-05-10 DIAGNOSIS — A938 Other specified arthropod-borne viral fevers: Secondary | ICD-10-CM

## 2016-05-10 DIAGNOSIS — E785 Hyperlipidemia, unspecified: Secondary | ICD-10-CM

## 2016-05-10 DIAGNOSIS — S62501A Fracture of unspecified phalanx of right thumb, initial encounter for closed fracture: Secondary | ICD-10-CM | POA: Insufficient documentation

## 2016-05-10 DIAGNOSIS — Z79899 Other long term (current) drug therapy: Secondary | ICD-10-CM

## 2016-05-10 DIAGNOSIS — W57XXXA Bitten or stung by nonvenomous insect and other nonvenomous arthropods, initial encounter: Secondary | ICD-10-CM

## 2016-05-10 DIAGNOSIS — S30861A Insect bite (nonvenomous) of abdominal wall, initial encounter: Secondary | ICD-10-CM

## 2016-05-10 LAB — LIPID PANEL
CHOLESTEROL: 231 mg/dL — AB (ref 0–200)
HDL: 53.6 mg/dL (ref >39.00)
LDL Cholesterol: 149 mg/dL — ABNORMAL HIGH (ref 0–99)
NonHDL: 177.84
Total CHOL/HDL Ratio: 4
Triglycerides: 142 mg/dL (ref 0.0–149.0)
VLDL: 28.4 mg/dL (ref 0.0–40.0)

## 2016-05-10 LAB — COMPREHENSIVE METABOLIC PANEL
ALBUMIN: 4.3 g/dL (ref 3.5–5.2)
ALK PHOS: 86 U/L (ref 39–117)
ALT: 48 U/L — ABNORMAL HIGH (ref 0–35)
AST: 42 U/L — ABNORMAL HIGH (ref 0–37)
BUN: 9 mg/dL (ref 6–23)
CO2: 27 mEq/L (ref 19–32)
Calcium: 9 mg/dL (ref 8.4–10.5)
Chloride: 101 mEq/L (ref 96–112)
Creatinine, Ser: 0.96 mg/dL (ref 0.40–1.20)
GFR: 63.63 mL/min (ref >60.00)
Glucose, Bld: 92 mg/dL (ref 70–99)
POTASSIUM: 4.3 meq/L (ref 3.5–5.1)
Sodium: 137 mEq/L (ref 135–145)
TOTAL PROTEIN: 7.4 g/dL (ref 6.0–8.3)
Total Bilirubin: 0.3 mg/dL (ref 0.2–1.2)

## 2016-05-10 MED ORDER — DOXYCYCLINE HYCLATE 100 MG PO CAPS: 100.0000 mg | ORAL_CAPSULE | Freq: Two times a day (BID) | ORAL | Status: DC

## 2016-05-10 MED ORDER — PROMETHAZINE HCL 25 MG PO TABS: 25.0000 mg | ORAL_TABLET | Freq: Three times a day (TID) | ORAL | Status: AC | PRN

## 2016-05-10 NOTE — Assessment & Plan Note (Signed)
She has fevers headache myalgias after finding two engorged ticks on abdomen within the past week.  LFTS are slightly elevated,  ,  Treating empiriclally for RMSF with doxycycline and phenergan for nausea

## 2016-05-10 NOTE — Progress Notes (Signed)
Subjective:  Patient ID: Nichole Riddle, female    DOB: 06-09-1961  Age: 55 y.o. MRN: [id number]  CC: The primary encounter diagnosis was Tick bite of abdomen, initial encounter. Diagnoses of Tick borne fever and Fracture of thumb, right, closed, with routine healing, subsequent encounter were also pertinent to this visit.  HPI Nichole Riddle presents with signs and symptoms concerning for  RMSF.  Woke up with fever to 101  24 hours ago,  Accompanied by headache, nausea and myalgias. Had been working in the yard push mowing the lawn and dog sitting.  Has pulled two dog ticks off right lateral abdominal wall and left lower abdominal wall in the last week. Denies rash. Recent right  thumb fracture . Had her thumb bent back from dog leash while dog walking . Saw Nichole Riddle, Wore splint until May 31 ,  Still hurts a lot and swells  2 weeks later broke 3rd toe  Both treated at Urgent Care   Getting heberden's nodes on right hand index finger and 5th finger. Has has steroid injections in the thumb in the past   Outpatient Prescriptions Prior to Visit  Medication Sig Dispense Refill  . albuterol (PROVENTIL HFA;VENTOLIN HFA) 108 (90 BASE) MCG/ACT inhaler Inhale 2 puffs into the lungs every 6 (six) hours as needed for wheezing or shortness of breath. 1 Inhaler 2  . ALPRAZolam (XANAX) 0.5 MG tablet TAKE 1 TABLET BY MOUTH EVERY NIGHT AT BEDTIME AS NEEDED FOR ANXIETY 30 tablet 5  . clonazePAM (KLONOPIN) 0.5 MG tablet Take 1 tablet (0.5 mg total) by mouth 2 (two) times daily as needed for anxiety. 60 tablet 1  . estradiol (ESTRACE VAGINAL) 0.1 MG/GM vaginal cream Place 1 Applicatorful vaginally at bedtime. FOR 2 WEEKS,  Then reduce use to twice weekly 42.5 g 12  . fluconazole (DIFLUCAN) 150 MG tablet Take 1 tablet (150 mg total) by mouth daily. 2 tablet 0  . ondansetron (ZOFRAN ODT) 8 MG disintegrating tablet Take 1 tablet (8 mg total) by mouth every 8 (eight) hours as needed for nausea or vomiting. 20 tablet  0  . rizatriptan (MAXALT-MLT) 10 MG disintegrating tablet DISSOLVE 1 TABLET ON THE TONGUE AS NEEDED FOR MIGRAINE, MAY REPEAT IN 2 HOURS IF NEEDED 10 tablet 3  . rosuvastatin (CRESTOR) 10 MG tablet Take 1 tablet (10 mg total) by mouth daily. 90 tablet 3  . traMADol (ULTRAM) 50 MG tablet Take 1 tablet (50 mg total) by mouth every 8 (eight) hours as needed. 30 tablet 0  . traZODone (DESYREL) 50 MG tablet Take 0.5-1 tablets (25-50 mg total) by mouth at bedtime as needed for sleep. 90 tablet 3  . venlafaxine XR (EFFEXOR XR) 37.5 MG 24 hr capsule Take 1 capsule (37.5 mg total) by mouth daily with breakfast. Increase to 75 mg after two weeks 60 capsule 2   No facility-administered medications prior to visit.    Review of Systems;  Patient denies headache, fevers, malaise, unintentional weight loss, skin rash, eye pain, sinus congestion and sinus pain, sore throat, dysphagia,  hemoptysis , cough, dyspnea, wheezing, chest pain, palpitations, orthopnea, edema, abdominal pain, nausea, melena, diarrhea, constipation, flank pain, dysuria, hematuria, urinary  Frequency, nocturia, numbness, tingling, seizures,  Focal weakness, Loss of consciousness,  Tremor, insomnia, depression, anxiety, and suicidal ideation.      Objective:  BP 136/78 mmHg  Pulse 82  Temp(Src) 98.1 F (36.7 C) (Oral)  Resp 12  Ht 5\' 3"  (1.6 m)  Wt 163 lb 8 oz (  74.163 kg)  BMI 28.97 kg/m2  SpO2 96%  BP Readings from Last 3 Encounters:  05/10/16 136/78  03/08/16 108/76  12/24/15 106/62    Wt Readings from Last 3 Encounters:  05/10/16 163 lb 8 oz (74.163 kg)  03/08/16 166 lb 8 oz (75.524 kg)  12/24/15 166 lb (75.297 kg)    General appearance: alert, cooperative and appears stated age Ears: normal TM's and external ear canals both ears Throat: lips, mucosa, and tongue normal; teeth and gums normal Neck: no adenopathy, no carotid bruit, supple, symmetrical, trachea midline and thyroid not enlarged, symmetric, no  tenderness/mass/nodules Back: symmetric, no curvature. ROM normal. No CVA tenderness. Lungs: clear to auscultation bilaterally Heart: regular rate and rhythm, S1, S2 normal, no murmur, click, rub or gallop Abdomen: soft, non-tender; bowel sounds normal; no masses,  no organomegaly Pulses: 2+ and symmetric Skin: Skin color, texture, turgor normal. No rashes or lesions Lymph nodes: Cervical, supraclavicular, and axillary nodes normal.  Lab Results  Component Value Date   HGBA1C 5.8 10/25/2012    Lab Results  Component Value Date   CREATININE 0.96 05/10/2016   CREATININE 0.93 03/08/2016   CREATININE 0.77 12/22/2015    Lab Results  Component Value Date   WBC 5.3 03/08/2016   HGB 13.0 03/08/2016   HCT 39.6 03/08/2016   PLT 256.0 03/08/2016   GLUCOSE 92 05/10/2016   CHOL 231* 05/10/2016   TRIG 142.0 05/10/2016   HDL 53.60 05/10/2016   LDLDIRECT 265.0 03/08/2016   LDLCALC 149* 05/10/2016   ALT 48* 05/10/2016   AST 42* 05/10/2016   NA 137 05/10/2016   K 4.3 05/10/2016   CL 101 05/10/2016   CREATININE 0.96 05/10/2016   BUN 9 05/10/2016   CO2 27 05/10/2016   TSH 2.580 05/14/2014   INR 1.0 11/08/2014   HGBA1C 5.8 10/25/2012   MICROALBUR 0.50 03/09/2013    No results found.  Assessment & Plan:   Problem List Items Addressed This Visit    Tick borne fever    She has fevers headache myalgias after finding two engorged ticks on abdomen within the past week.  LFTS are slightly elevated,  ,  Treating empiriclally for RMSF with doxycycline and phenergan for nausea       Relevant Orders   Ehrlichia antibody panel   Rocky mtn spotted fvr abs pnl(IgG+IgM)   CBC with Differential/Platelet   Lyme Ab/Western Blot Reflex   Fracture of thumb, right, closed    Treated with brace by Nichole Riddle.  Still swollen and tender.        Other Visit Diagnoses    Tick bite of abdomen, initial encounter    -  Primary    Relevant Orders    Ehrlichia antibody panel    Rocky mtn spotted fvr abs  pnl(IgG+IgM)    CBC with Differential/Platelet    Lyme Ab/Western Blot Reflex       I am having Nichole Riddle start on doxycycline and promethazine. I am also having her maintain her rizatriptan, albuterol, ondansetron, traMADol, fluconazole, ALPRAZolam, estradiol, traZODone, clonazePAM, rosuvastatin, and venlafaxine XR.  Meds ordered this encounter  Medications  . doxycycline (VIBRAMYCIN) 100 MG capsule    Sig: Take 1 capsule (100 mg total) by mouth 2 (two) times daily.    Dispense:  14 capsule    Refill:  0  . promethazine (PHENERGAN) 25 MG tablet    Sig: Take 1 tablet (25 mg total) by mouth every 8 (eight) hours as needed for nausea or vomiting.  Dispense:  30 tablet    Refill:  0    There are no discontinued medications.  Follow-up: No Follow-up on file.   Crecencio Mc, MD

## 2016-05-10 NOTE — Progress Notes (Signed)
Pre-visit discussion using our clinic review tool. No additional management support is needed unless otherwise documented below in the visit note.  

## 2016-05-10 NOTE — Patient Instructions (Signed)
[place] Spotted Fever [place] spotted fever is an illness that is spread to people by infected ticks. The illness causes flulike symptoms and a reddish-purple rash. This illness can quickly become very serious. Treatment must be started right away. When the illness is not treated right away, it can sometimes lead to long-term health problems or even death. This illness is most common during warm weather when ticks are most active. CAUSES [facility] spotted fever is caused by a type of bacteria that is called Rickettsia rickettsii. This type of bacteria is carried by [place] dog ticks and [employer]. People get infected through a bite from a tick that is infected with the bacteria. The bite is painless, and it frequently goes unnoticed. The bacteria can also infect a person when tick blood or tick feces get into a person's body through damaged skin. A tick bite is not necessary for an infection to occur. People can get [facility] spotted fever if they get a tick's blood or body fluids on their skin in the area of a small cut or sore. This could happen while removing a tick from another person or a dog. The infection is not contagious, and it cannot be spread (transmitted) from person to person. SIGNS AND SYMPTOMS Symptoms may begin 2-14 days after a tick bite. The most common early symptoms are:  Fever.  Muscle aches.  Headache.  Nausea.  Vomiting.  Poor appetite.  Abdominal pain. The reddish-purple rash usually appears 3-5 days after the first symptoms begin. The rash often starts on the wrists and ankles. It may then spread to the palms, the soles of the feet, the legs, and the trunk. DIAGNOSIS Diagnosis is based on a physical exam, medical history, and blood tests. Your health care provider may suspect [facility] spotted fever in one of these cases:   If you have recently been bitten by a tick.  If you have been in areas that have a lot of ticks  or in areas where the disease is common. TREATMENT It is important to begin treatment right away. Treatment will usually involve the use of antibiotic medicines. In some cases, your health care provider may begin treatment before the diagnosis is confirmed. If your symptoms are severe, a hospital stay may be needed. HOME CARE INSTRUCTIONS  Rest as much as possible until you feel better.  Take medicines only as directed by your health care provider.  Take your antibiotic medicine as directed by your health care provider. Finish the antibiotic even if you start to feel better.  Drink enough fluid to keep your urine clear or pale yellow.  Keep all follow-up visits as directed by your health care provider. This is important. PREVENTION Avoiding tick bites can help to prevent this illness. Take these steps to avoid tick bites when you are outdoors:  Be aware that most ticks live in shrubs, low tree branches, and grassy areas. A tick can climb onto your body when you make contact with leaves or grass where the tick is waiting.  Wear protective clothing. Long sleeves and long pants are best.  Wear white clothes so you can see ticks more easily.  Tuck your pant legs into your socks.  If you go walking on a trail, stay in the middle of the trail to avoid brushing against bushes.  Avoid walking through areas that have long grass.  Put insect repellent on all exposed skin and along boot tops, pant legs, and sleeve cuffs.  Check clothing, hair, and skin repeatedly and before going inside.  Check family members and pets for ticks.  Brush off any ticks that are not attached.  Take a shower or a bath as soon as possible after you have been outdoors. Check your skin for ticks. The most common places on the body where ticks attach themselves are the scalp, neck, armpits, waist, and groin. You can also greatly reduce your chances of getting [facility] spotted fever if you remove attached  ticks as soon as possible. To remove an attached tick, use a forceps or fine-point tweezers to detach the intact tick without leaving its mouth parts in the skin. The wound from the tick bite should be washed after the tick has been removed. SEEK MEDICAL CARE IF:  You have drainage, swelling, or increased redness or pain in the area of the rash. SEEK IMMEDIATE MEDICAL CARE IF:  You have chest pain.  You have shortness of breath.  You have a severe headache.  You have a seizure.  You have severe abdominal pain.  You are feeling confused.  You are bruising easily.  You have bleeding from your gums.  You have blood in your stool.   This information is not intended to replace advice given to you by your health care provider. Make sure you discuss any questions you have with your health care provider.   Document Released: 02/27/2001 Document Revised: 12/06/2014 Document Reviewed: 07/01/2014 Elsevier Interactive Patient Education [employer].

## 2016-05-10 NOTE — Assessment & Plan Note (Signed)
Treated with brace by Rudene Christians.  Still swollen and tender.

## 2016-05-12 ENCOUNTER — Encounter: Payer: Self-pay | Admitting: Internal Medicine

## 2016-05-12 ENCOUNTER — Other Ambulatory Visit: Payer: Self-pay | Admitting: Internal Medicine

## 2016-05-12 DIAGNOSIS — R748 Abnormal levels of other serum enzymes: Secondary | ICD-10-CM

## 2016-07-01 ENCOUNTER — Encounter: Payer: Self-pay | Admitting: Internal Medicine

## 2016-07-12 ENCOUNTER — Other Ambulatory Visit: Payer: Self-pay | Admitting: Internal Medicine

## 2016-07-12 ENCOUNTER — Telehealth: Payer: Self-pay | Admitting: Internal Medicine

## 2016-07-12 MED ORDER — CLONAZEPAM 0.5 MG PO TABS: 0.5000 mg | ORAL_TABLET | Freq: Two times a day (BID) | ORAL | 2 refills | Status: DC | PRN

## 2016-07-12 NOTE — Telephone Encounter (Signed)
My  chart message sent see below.

## 2016-07-12 NOTE — Telephone Encounter (Signed)
Nichole Riddle called in regards to her pharmacy telling her they received a denial as far as refilling Xanax for her. I read the MyChart message Dr. Derrel Nip sent her today. She said she WILL continue the Clonazepam. She understands Dr. Derrel Nip has wanted her to discontinue Xanax for some time so she's ok with sticking with Clonazepam. Thank you.

## 2016-07-12 NOTE — Telephone Encounter (Signed)
Last fill was 12/24/15 with 5 refills. On office visit 03/08/16 Trazodone was started and Xanax stopped but she states migraines have gotten much worse on Trazodone so she stopped taking this. She takes Xanax and Klonopin periodically not daily. Azalee Course, RMA

## 2016-07-12 NOTE — Telephone Encounter (Signed)
I will not refill the clonazepam AND the alprazolam because of the risk of dependence and overdose with 2 benzodiazepines .  She needs to make a choice between the two. If I refill the alprazolam I wil discontinue the clonazepam .

## 2016-07-12 NOTE — Telephone Encounter (Signed)
Please review. Thank you-Kijuan Gallicchio, RMA

## 2016-07-12 NOTE — Telephone Encounter (Signed)
CLONAZEPAM WILL BE REFILLED, going forward.  rx printed

## 2016-07-13 NOTE — Telephone Encounter (Signed)
Pt advised-aa 

## 2016-07-15 ENCOUNTER — Other Ambulatory Visit: Payer: Self-pay | Admitting: Internal Medicine

## 2016-07-16 ENCOUNTER — Encounter: Payer: Self-pay | Admitting: Internal Medicine

## 2016-07-20 ENCOUNTER — Other Ambulatory Visit (INDEPENDENT_AMBULATORY_CARE_PROVIDER_SITE_OTHER): Payer: BLUE CROSS/BLUE SHIELD

## 2016-07-20 DIAGNOSIS — W57XXXA Bitten or stung by nonvenomous insect and other nonvenomous arthropods, initial encounter: Secondary | ICD-10-CM

## 2016-07-20 DIAGNOSIS — S30861A Insect bite (nonvenomous) of abdominal wall, initial encounter: Secondary | ICD-10-CM | POA: Diagnosis not present

## 2016-07-20 DIAGNOSIS — A938 Other specified arthropod-borne viral fevers: Secondary | ICD-10-CM

## 2016-07-20 DIAGNOSIS — R748 Abnormal levels of other serum enzymes: Secondary | ICD-10-CM | POA: Diagnosis not present

## 2016-07-20 LAB — CBC WITH DIFFERENTIAL/PLATELET
Basophils Absolute: 0 10*3/uL (ref 0.0–0.1)
Basophils Relative: 0.7 % (ref 0.0–3.0)
EOS PCT: 1 % (ref 0.0–5.0)
Eosinophils Absolute: 0 10*3/uL (ref 0.0–0.7)
HCT: 40.2 % (ref 36.0–46.0)
HEMOGLOBIN: 13.5 g/dL (ref 12.0–15.0)
LYMPHS PCT: 47.2 % — AB (ref 12.0–46.0)
Lymphs Abs: 1.7 10*3/uL (ref 0.7–4.0)
MCHC: 33.6 g/dL (ref 30.0–36.0)
MCV: 83 fl (ref 78.0–100.0)
MONO ABS: 0.3 10*3/uL (ref 0.1–1.0)
MONOS PCT: 7.7 % (ref 3.0–12.0)
Neutro Abs: 1.6 10*3/uL (ref 1.4–7.7)
Neutrophils Relative %: 43.4 % (ref 43.0–77.0)
Platelets: 238 10*3/uL (ref 150.0–400.0)
RBC: 4.85 Mil/uL (ref 3.87–5.11)
RDW: 13.2 % (ref 11.5–15.5)
WBC: 3.7 10*3/uL — AB (ref 4.0–10.5)

## 2016-07-20 LAB — COMPREHENSIVE METABOLIC PANEL
ALBUMIN: 4.3 g/dL (ref 3.5–5.2)
ALK PHOS: 78 U/L (ref 39–117)
ALT: 15 U/L (ref 0–35)
AST: 18 U/L (ref 0–37)
BUN: 10 mg/dL (ref 6–23)
CO2: 28 mEq/L (ref 19–32)
Calcium: 8.9 mg/dL (ref 8.4–10.5)
Chloride: 102 mEq/L (ref 96–112)
Creatinine, Ser: 0.89 mg/dL (ref 0.40–1.20)
GFR: 69.39 mL/min (ref >60.00)
Glucose, Bld: 93 mg/dL (ref 70–99)
POTASSIUM: 4.2 meq/L (ref 3.5–5.1)
SODIUM: 137 meq/L (ref 135–145)
TOTAL PROTEIN: 7.3 g/dL (ref 6.0–8.3)
Total Bilirubin: 0.3 mg/dL (ref 0.2–1.2)

## 2016-07-21 LAB — LYME AB/WESTERN BLOT REFLEX

## 2016-07-22 LAB — ROCKY MTN SPOTTED FVR ABS PNL(IGG+IGM)
RMSF IGM: NOT DETECTED
RMSF IgG: NOT DETECTED

## 2016-07-28 LAB — EHRLICHIA ANTIBODY PANEL: E chaffeensis (HGE) Ab, IgM: <1:20 {titer}

## 2016-07-29 ENCOUNTER — Encounter: Payer: Self-pay | Admitting: Internal Medicine

## 2016-08-09 ENCOUNTER — Telehealth: Payer: Self-pay | Admitting: *Deleted

## 2016-08-09 ENCOUNTER — Ambulatory Visit: Payer: Self-pay | Admitting: Internal Medicine

## 2016-08-09 DIAGNOSIS — Z0289 Encounter for other administrative examinations: Secondary | ICD-10-CM

## 2016-08-09 NOTE — Telephone Encounter (Signed)
[facility]'s schedule should be openning for this week, try one of those slots first, thanks

## 2016-08-09 NOTE — Telephone Encounter (Signed)
Pt will like to reschedule her appt from tis afternoon , please give a time and date to place pt for a office visit

## 2016-08-09 NOTE — Telephone Encounter (Signed)
Scheduled

## 2016-08-12 ENCOUNTER — Encounter: Payer: Self-pay | Admitting: Internal Medicine

## 2016-08-12 ENCOUNTER — Ambulatory Visit (INDEPENDENT_AMBULATORY_CARE_PROVIDER_SITE_OTHER): Payer: BLUE CROSS/BLUE SHIELD | Admitting: Internal Medicine

## 2016-08-12 ENCOUNTER — Ambulatory Visit: Payer: Self-pay | Admitting: Internal Medicine

## 2016-08-12 DIAGNOSIS — F32A Depression, unspecified: Secondary | ICD-10-CM

## 2016-08-12 DIAGNOSIS — F419 Anxiety disorder, unspecified: Principal | ICD-10-CM

## 2016-08-12 DIAGNOSIS — F418 Other specified anxiety disorders: Secondary | ICD-10-CM | POA: Diagnosis not present

## 2016-08-12 DIAGNOSIS — M2550 Pain in unspecified joint: Secondary | ICD-10-CM | POA: Diagnosis not present

## 2016-08-12 DIAGNOSIS — F329 Major depressive disorder, single episode, unspecified: Secondary | ICD-10-CM

## 2016-08-12 DIAGNOSIS — T148 Other injury of unspecified body region: Secondary | ICD-10-CM | POA: Diagnosis not present

## 2016-08-12 DIAGNOSIS — T148XXA Other injury of unspecified body region, initial encounter: Secondary | ICD-10-CM

## 2016-08-12 MED ORDER — DULOXETINE HCL 30 MG PO CPEP: 30.0000 mg | ORAL_CAPSULE | Freq: Every day | ORAL | 0 refills | Status: DC

## 2016-08-12 NOTE — Progress Notes (Signed)
Subjective:  Patient ID: Nichole Riddle, female    DOB: 11/08/1961  Age: 55 y.o. MRN: [id number]  CC: Diagnoses of Anxiety and depression, Arthralgia of multiple joints, and Bruising were pertinent to this visit.  HPI Nichole Riddle presents for follow up on anxiety and depression managed with effexor and clonazepam .  She  Has been avoiding daily use of trazodone due to resultant excessive sedation,  And has been limiting her use of clonazepam to prn.  She is averaging daily use for periods of less than 3 weeks, then tries to wean off.   Pain I njoints ,  Diffuse. Tick labs were negative for infection.  She has diffuse myalgias  Starting taking crestor in April.  Stopped it 3 weeks ago,  Muscles still hurting  Aching in neck , shoulders,  Medial side of both knees.  Gets pains in hands and feet that she describes as a hot searing pain   But hands are not warm. Symptoms started after the crestor .  Top of right hip is tender.  History of remote blunt trauma  To hip and pelivs in 200 when she fell off the roof of a house she was cleaning. "overall I feel like shit"  Seeing a counsellor as of Monday for  Weekly therapy sessions .   Diet reviewed. Eating clean,  Lots of fruits . Has resumed soft drinks daily  Takinn 75 mg effexor  Having increase in migraines , gets left sided facial spasms with it.     Not exercising because muscles were aching so much.     Outpatient Medications Prior to Visit  Medication Sig Dispense Refill  . clonazePAM (KLONOPIN) 0.5 MG tablet Take 1 tablet (0.5 mg total) by mouth 2 (two) times daily as needed for anxiety. 60 tablet 2  . estradiol (ESTRACE VAGINAL) 0.1 MG/GM vaginal cream Place 1 Applicatorful vaginally at bedtime. FOR 2 WEEKS,  Then reduce use to twice weekly 42.5 g 12  . promethazine (PHENERGAN) 25 MG tablet Take 1 tablet (25 mg total) by mouth every 8 (eight) hours as needed for nausea or vomiting. 30 tablet 0  . rizatriptan (MAXALT-MLT) 10 MG  disintegrating tablet DISSOLVE 1 TABLET ON THE TONGUE AS NEEDED FOR MIGRAINE, MAY REPEAT IN 2 HOURS IF NEEDED 10 tablet 3  . rosuvastatin (CRESTOR) 10 MG tablet Take 1 tablet (10 mg total) by mouth daily. 90 tablet 3  . traZODone (DESYREL) 50 MG tablet Take 0.5-1 tablets (25-50 mg total) by mouth at bedtime as needed for sleep. 90 tablet 3  . venlafaxine XR (EFFEXOR-XR) 37.5 MG 24 hr capsule TAKE 1 CAPSULE (37.5 MG TOTAL) BY MOUTH DAILY WITH BREAKFAST. INCREASE TO 2 CAPSULES AFTER TWO WEEKS 60 capsule 2  . albuterol (PROVENTIL HFA;VENTOLIN HFA) 108 (90 BASE) MCG/ACT inhaler Inhale 2 puffs into the lungs every 6 (six) hours as needed for wheezing or shortness of breath. (Patient not taking: Reported on 08/12/2016) 1 Inhaler 2  . ALPRAZolam (XANAX) 0.5 MG tablet TAKE 1 TABLET BY MOUTH EVERY NIGHT AT BEDTIME AS NEEDED FOR ANXIETY 30 tablet 5  . doxycycline (VIBRAMYCIN) 100 MG capsule Take 1 capsule (100 mg total) by mouth 2 (two) times daily. 14 capsule 0  . fluconazole (DIFLUCAN) 150 MG tablet Take 1 tablet (150 mg total) by mouth daily. 2 tablet 0  . ondansetron (ZOFRAN ODT) 8 MG disintegrating tablet Take 1 tablet (8 mg total) by mouth every 8 (eight) hours as needed for nausea or vomiting. 20 tablet  0  . traMADol (ULTRAM) 50 MG tablet Take 1 tablet (50 mg total) by mouth every 8 (eight) hours as needed. 30 tablet 0   No facility-administered medications prior to visit.     Review of Systems;  Patient denies headache, fevers, malaise, unintentional weight loss, skin rash, eye pain, sinus congestion and sinus pain, sore throat, dysphagia,  hemoptysis , cough, dyspnea, wheezing, chest pain, palpitations, orthopnea, edema, abdominal pain, nausea, melena, diarrhea, constipation, flank pain, dysuria, hematuria, urinary  Frequency, nocturia, numbness, tingling, seizures,  Focal weakness, Loss of consciousness,  Tremor, insomnia, depression, anxiety, and suicidal ideation.      Objective:  BP 116/72 (BP  Location: Right Arm, Patient Position: Sitting, Cuff Size: Normal)   Pulse 67   Temp 98.1 F (36.7 C) (Oral)   Resp 16   Ht 5\' 3"  (1.6 m)   Wt 162 lb 9.6 oz (73.8 kg)   SpO2 96%   BMI 28.80 kg/m   BP Readings from Last 3 Encounters:  08/12/16 116/72  05/10/16 136/78  03/08/16 108/76    Wt Readings from Last 3 Encounters:  08/12/16 162 lb 9.6 oz (73.8 kg)  05/10/16 163 lb 8 oz (74.2 kg)  03/08/16 166 lb 8 oz (75.5 kg)    General appearance: alert, cooperative and appears stated age Ears: normal TM's and external ear canals both ears Throat: lips, mucosa, and tongue normal; teeth and gums normal Neck: no adenopathy, no carotid bruit, supple, symmetrical, trachea midline and thyroid not enlarged, symmetric, no tenderness/mass/nodules Back: symmetric, no curvature. ROM normal. No CVA tenderness. Lungs: clear to auscultation bilaterally Heart: regular rate and rhythm, S1, S2 normal, no murmur, click, rub or gallop Abdomen: soft, non-tender; bowel sounds normal; no masses,  no organomegaly Pulses: 2+ and symmetric Skin: Skin color, texture, turgor normal. No rashes or lesions Lymph nodes: Cervical, supraclavicular, and axillary nodes normal.  Lab Results  Component Value Date   HGBA1C 5.8 10/25/2012    Lab Results  Component Value Date   CREATININE 0.89 07/20/2016   CREATININE 0.96 05/10/2016   CREATININE 0.93 03/08/2016    Lab Results  Component Value Date   WBC 3.7 (L) 07/20/2016   HGB 13.5 07/20/2016   HCT 40.2 07/20/2016   PLT 238.0 07/20/2016   GLUCOSE 93 07/20/2016   CHOL 231 (H) 05/10/2016   TRIG 142.0 05/10/2016   HDL 53.60 05/10/2016   LDLDIRECT 265.0 03/08/2016   LDLCALC 149 (H) 05/10/2016   ALT 15 07/20/2016   AST 18 07/20/2016   NA 137 07/20/2016   K 4.2 07/20/2016   CL 102 07/20/2016   CREATININE 0.89 07/20/2016   BUN 10 07/20/2016   CO2 28 07/20/2016   TSH 2.580 05/14/2014   INR 1.0 11/08/2014   HGBA1C 5.8 10/25/2012   MICROALBUR 0.50  03/09/2013    No results found.  Assessment & Plan:   Problem List Items Addressed This Visit    Anxiety and depression    Continue prn clonazepam.  Trial of Savella for signs and symptoms suggestive of FM.  Will wean the Effexor to off.       Arthralgia of multiple joints    Worse with repeat statin trial.  She has stopped Crestor. Tick antibody panels were negative. Previous screening serologies for inflammatory and autoimmune conditions were negative. Trial of Savella for FM       Bruising    She has no bleeding of gums.  Reassurance again provided.   Lab Results  Component Value Date  WBC 3.7 (L) 07/20/2016   HGB 13.5 07/20/2016   HCT 40.2 07/20/2016   MCV 83.0 07/20/2016   PLT 238.0 07/20/2016          Other Visit Diagnoses   None.     I have discontinued Ms. Gasparini's ondansetron, traMADol, fluconazole, ALPRAZolam, and doxycycline. I am also having her start on DULoxetine. Additionally, I am having her maintain her rizatriptan, albuterol, estradiol, traZODone, rosuvastatin, promethazine, clonazePAM, and venlafaxine XR.  Meds ordered this encounter  Medications  . DULoxetine (CYMBALTA) 30 MG capsule    Sig: Take 1 capsule (30 mg total) by mouth daily. Increase to 2 daily after one week    Dispense:  60 capsule    Refill:  0    Medications Discontinued During This Encounter  Medication Reason  . traMADol (ULTRAM) 50 MG tablet Completed Course  . ondansetron (ZOFRAN ODT) 8 MG disintegrating tablet Patient Discharge  . fluconazole (DIFLUCAN) 150 MG tablet Completed Course  . doxycycline (VIBRAMYCIN) 100 MG capsule Completed Course  . ALPRAZolam (XANAX) 0.5 MG tablet Change in therapy    Follow-up: Return in about 4 weeks (around 09/09/2016).   Crecencio Mc, MD

## 2016-08-12 NOTE — Progress Notes (Signed)
Pre visit review using our clinic review tool, if applicable. No additional management support is needed unless otherwise documented below in the visit note. 

## 2016-08-12 NOTE — Patient Instructions (Addendum)
I am recommending a trial of Cymbalta to help manage your muscle pain   Start with 30 mg daily in the evening .   Reduce  Your dose of Effexor to one tablet daily on Day 4 of cymbalta.  You can increase the cymbalta  after one week to 60 mg if tolerated.    At that point reduce the dose of Effexor toone tablet  every other day   for one week, then stop

## 2016-08-14 NOTE — Assessment & Plan Note (Addendum)
Worse with repeat statin trial.  She has stopped Crestor. Tick antibody panels were negative. Previous screening serologies for inflammatory and autoimmune conditions were negative. Trial of Savella for FM

## 2016-08-14 NOTE — Assessment & Plan Note (Signed)
She has no bleeding of gums.  Reassurance again provided.   Lab Results  Component Value Date   WBC 3.7 (L) 07/20/2016   HGB 13.5 07/20/2016   HCT 40.2 07/20/2016   MCV 83.0 07/20/2016   PLT 238.0 07/20/2016

## 2016-08-14 NOTE — Assessment & Plan Note (Signed)
Continue prn clonazepam.  Trial of Savella for signs and symptoms suggestive of FM.  Will wean the Effexor to off.

## 2016-08-16 ENCOUNTER — Other Ambulatory Visit: Payer: Self-pay | Admitting: Internal Medicine

## 2016-09-28 ENCOUNTER — Other Ambulatory Visit: Payer: Self-pay | Admitting: Internal Medicine

## 2016-09-28 MED ORDER — VENLAFAXINE HCL ER 37.5 MG PO CP24: ORAL_CAPSULE | ORAL | 1 refills | Status: DC

## 2016-10-24 ENCOUNTER — Other Ambulatory Visit: Payer: Self-pay | Admitting: Internal Medicine

## 2016-10-25 ENCOUNTER — Other Ambulatory Visit: Payer: Self-pay | Admitting: Internal Medicine

## 2016-10-26 NOTE — Telephone Encounter (Signed)
Okay to refill? Last seen on 08/12/16. Last filled on 07/12/16 for #60 with 2 refills. No future appt scheduled.

## 2016-10-27 ENCOUNTER — Other Ambulatory Visit: Payer: Self-pay | Admitting: Internal Medicine

## 2016-10-27 MED ORDER — CLONAZEPAM 0.5 MG PO TABS: ORAL_TABLET | ORAL | 2 refills | Status: DC

## 2016-10-28 NOTE — Telephone Encounter (Signed)
Rx faxed

## 2016-12-22 ENCOUNTER — Encounter: Payer: Self-pay | Admitting: Internal Medicine

## 2016-12-30 ENCOUNTER — Telehealth: Payer: Self-pay | Admitting: *Deleted

## 2016-12-30 DIAGNOSIS — IMO0002 Reserved for concepts with insufficient information to code with codable children: Secondary | ICD-10-CM

## 2016-12-30 DIAGNOSIS — E559 Vitamin D deficiency, unspecified: Secondary | ICD-10-CM

## 2016-12-30 DIAGNOSIS — R740 Nonspecific elevation of levels of transaminase and lactic acid dehydrogenase [LDH]: Secondary | ICD-10-CM

## 2016-12-30 DIAGNOSIS — M791 Myalgia, unspecified site: Secondary | ICD-10-CM

## 2016-12-30 DIAGNOSIS — D708 Other neutropenia: Secondary | ICD-10-CM

## 2016-12-30 DIAGNOSIS — E785 Hyperlipidemia, unspecified: Secondary | ICD-10-CM

## 2016-12-30 NOTE — Telephone Encounter (Signed)
Fasting labs ordered

## 2016-12-30 NOTE — Telephone Encounter (Signed)
Pt informed

## 2016-12-30 NOTE — Telephone Encounter (Signed)
Pt requested to have labs prior to physical appt, on 02/28. Can pt be scheduled Pt contact [phone]

## 2017-01-04 ENCOUNTER — Ambulatory Visit (INDEPENDENT_AMBULATORY_CARE_PROVIDER_SITE_OTHER): Payer: BLUE CROSS/BLUE SHIELD | Admitting: Family

## 2017-01-04 ENCOUNTER — Ambulatory Visit (INDEPENDENT_AMBULATORY_CARE_PROVIDER_SITE_OTHER): Payer: BLUE CROSS/BLUE SHIELD

## 2017-01-04 ENCOUNTER — Encounter: Payer: Self-pay | Admitting: Family

## 2017-01-04 VITALS — BP 108/78 | HR 83 | Temp 97.8°F | Ht 63.0 in | Wt 165.2 lb

## 2017-01-04 DIAGNOSIS — J40 Bronchitis, not specified as acute or chronic: Secondary | ICD-10-CM | POA: Diagnosis not present

## 2017-01-04 MED ORDER — PREDNISONE 10 MG PO TABS: ORAL_TABLET | ORAL | 0 refills | Status: DC

## 2017-01-04 MED ORDER — BENZONATATE 100 MG PO CAPS: 100.0000 mg | ORAL_CAPSULE | Freq: Two times a day (BID) | ORAL | 1 refills | Status: DC | PRN

## 2017-01-04 NOTE — Progress Notes (Signed)
Pre visit review using our clinic review tool, if applicable. No additional management support is needed unless otherwise documented below in the visit note. 

## 2017-01-04 NOTE — Patient Instructions (Signed)
Please take cough medication at night only as needed. As we discussed, I do not recommend dosing throughout the day as coughing is a protective mechanism . It also helps to break up thick mucous.  Do not take cough suppressants with alcohol as can lead to trouble breathing. Advise caution if taking cough suppressant and operating machinery ( i.e driving a car) as you may feel very tired.   Use albuterol every 6 hours for first 24 hours to get good medication into the lungs and loosen congestion; after, you may use as needed and eventually stop all together when cough resolves.  Increase intake of clear fluids. Congestion is best treated by hydration, when mucus is wetter, it is thinner, less sticky, and easier to expel from the body, either through coughing up drainage, or by blowing your nose.   Get plenty of rest.   Use saline nasal drops and blow your nose frequently. Run a humidifier at night and elevate the head of the bed. Vicks Vapor rub will help with congestion and cough. Steam showers and sinus massage for congestion.   Use Acetaminophen or Ibuprofen as needed for fever or pain. Avoid second hand smoke. Even the smallest exposure will worsen symptoms.   Over the counter medications you can try include Delsym for cough, a decongestant for congestion, and Mucinex or Robitussin as an expectorant. Be sure to just get the plain Mucinex or Robitussin that just has one medication (Guaifenesen). We don't recommend the combination products. Note, be sure to drink two glasses of water with each dose of Mucinex as the medication will not work well without adequate hydration.   You can also try a teaspoon of honey to see if this will help reduce cough. Throat lozenges can sometimes be beneficial as well.    This illness will typically last 7 - 10 days.   Please follow up with our clinic if you develop a fever greater than 101 F, symptoms worsen, or do not resolve in the next week.

## 2017-01-04 NOTE — Progress Notes (Signed)
Subjective:    Patient ID: Nichole Riddle, female    DOB: 1961/11/21, 56 y.o.   MRN: [id number]  CC: [name] is a 56 y.o. female who presents today for an acute visit.    HPI: Chief complaint of dry cough 2 weeks, waxing and waning. Some clear mucous with cough. Endorses scratchy throat and ear pressure.  Cough worse with moving around 'or gets hot.' Continues to SOB. In the past, has had similar SOB with 'bronchitis.' Relief with alzselzter and albuterol.    Had been seen at [facility] and suspect flu. Was given azithromycin and prednisone, no tamiflu. Felt better.   No lung disease  No LE swelling, CP, sinus pain.       HISTORY:  Past Medical History:  Diagnosis Date  . Anxiety 1997  . Bulimia 1997   per psychology note in chart  . Depression 1997  . Gastric ulcer 2012   NSAID induced   . Gastric ulcer due to Helicobacter pylori 0000000   and NSAID use  . History of cardiac catheterization 2012   normal   . HLD (hyperlipidemia)   . IBS (irritable bowel syndrome) 06/17/2015  . Irritable bowel    with alternating constipation/bloating and diarrhea  . Migraines   . Multiple gastric ulcers    esophagus   Past Surgical History:  Procedure Laterality Date  . ABDOMINAL HYSTERECTOMY    . COLONOSCOPY    . ESOPHAGOGASTRODUODENOSCOPY     Family History  Problem Relation Age of Onset  . Heart disease Father 73    died of massive mi  . Heart disease Brother 34    has had 4 MIs, cardiomyopathy  . Celiac disease Brother   . Esophageal cancer Maternal Grandmother   . Stomach cancer Maternal Grandmother   . Irritable bowel syndrome Mother   . Ulcerative colitis Mother     Allergies: Bactrim [sulfamethoxazole-trimethoprim]; Lipitor [atorvastatin]; Nitroglycerin; Prednisone; Topamax [topiramate]; and Vicodin [hydrocodone-acetaminophen] Current Outpatient Prescriptions on File Prior to Visit  Medication Sig Dispense Refill  . albuterol (PROVENTIL HFA;VENTOLIN HFA) 108  (90 BASE) MCG/ACT inhaler Inhale 2 puffs into the lungs every 6 (six) hours as needed for wheezing or shortness of breath. 1 Inhaler 2  . clonazePAM (KLONOPIN) 0.5 MG tablet TAKE 1 TABLET BY MOUTH TWICE A DAY AS NEEDED FOR ANXIETY 60 tablet 2  . estradiol (ESTRACE VAGINAL) 0.1 MG/GM vaginal cream Place 1 Applicatorful vaginally at bedtime. FOR 2 WEEKS,  Then reduce use to twice weekly 42.5 g 12  . promethazine (PHENERGAN) 25 MG tablet Take 1 tablet (25 mg total) by mouth every 8 (eight) hours as needed for nausea or vomiting. 30 tablet 0  . rizatriptan (MAXALT-MLT) 10 MG disintegrating tablet DISSOLVE 1 TABLET ON THE TONGUE AS NEEDED FOR MIGRAINE, MAY REPEAT IN 2 HOURS IF NEEDED 10 tablet 2  . venlafaxine XR (EFFEXOR-XR) 37.5 MG 24 hr capsule TAKE 1 CAPSULE (37.5 MG TOTAL) BY MOUTH DAILY WITH BREAKFAST. INCREASE TO 2 CAPSULES AFTER TWO WEEKS 180 capsule 1  . [DISCONTINUED] estradiol (ESTRACE) 0.5 MG tablet Take 1 tablet (0.5 mg total) by mouth daily. 30 tablet 11  . [DISCONTINUED] simvastatin (ZOCOR) 40 MG tablet Take 1 tablet (40 mg total) by mouth every evening. 90 tablet 2   No current facility-administered medications on file prior to visit.     Social History  Substance Use Topics  . Smoking status: Never Smoker  . Smokeless tobacco: Never Used  . Alcohol use 1.2 oz/week  2 Cans of beer per week     Comment: occasional-beer    Review of Systems  Constitutional: Negative for chills and fever.  HENT: Positive for congestion, ear pain and sore throat. Negative for sinus pain and sinus pressure.   Eyes: Negative for visual disturbance.  Respiratory: Positive for choking and shortness of breath. Negative for cough and wheezing.   Cardiovascular: Negative for chest pain and palpitations.  Gastrointestinal: Negative for nausea and vomiting.  Neurological: Negative for headaches.      Objective:    BP 108/78   Pulse 83   Temp 97.8 F (36.6 C) (Oral)   Ht 5\' 3"  (1.6 m)   Wt 165 lb  3.2 oz (74.9 kg)   SpO2 98%   BMI 29.26 kg/m    Physical Exam  Constitutional: She appears well-developed and well-nourished.  HENT:  Head: Normocephalic and atraumatic.  Right Ear: Hearing, tympanic membrane, external ear and ear canal normal. No drainage, swelling or tenderness. No foreign bodies. Tympanic membrane is not erythematous and not bulging. No middle ear effusion. No decreased hearing is noted.  Left Ear: Hearing, tympanic membrane, external ear and ear canal normal. No drainage, swelling or tenderness. No foreign bodies. Tympanic membrane is not erythematous and not bulging.  No middle ear effusion. No decreased hearing is noted.  Nose: Nose normal. No rhinorrhea. Right sinus exhibits no maxillary sinus tenderness and no frontal sinus tenderness. Left sinus exhibits no maxillary sinus tenderness and no frontal sinus tenderness.  Mouth/Throat: Uvula is midline, oropharynx is clear and moist and mucous membranes are normal. No oropharyngeal exudate, posterior oropharyngeal edema, posterior oropharyngeal erythema or tonsillar abscesses.  Eyes: Conjunctivae are normal.  Cardiovascular: Regular rhythm, normal heart sounds and normal pulses.   Pulmonary/Chest: Effort normal and breath sounds normal. She has no wheezes. She has no rhonchi. She has no rales.  Lymphadenopathy:       Head (right side): No submental, no submandibular, no tonsillar, no preauricular, no posterior auricular and no occipital adenopathy present.       Head (left side): No submental, no submandibular, no tonsillar, no preauricular, no posterior auricular and no occipital adenopathy present.    She has no cervical adenopathy.  Neurological: She is alert.  Skin: Skin is warm and dry.  Psychiatric: She has a normal mood and affect. Her speech is normal and behavior is normal. Thought content normal.  Vitals reviewed.      Assessment & Plan:  1. Bronchitis sa02 stayed at 98% while we walked down hallway. No  fever. Pending CXR to ensure no bacterial infection. Had felt better on steroids, will order short course. PRN tessalon.   - DG Chest 2 View - predniSONE (DELTASONE) 10 MG tablet; Take 40 mg by mouth on day 1, then taper 10 mg daily until gone  Dispense: 10 tablet; Refill: 0 - benzonatate (TESSALON PERLES) 100 MG capsule; Take 1 capsule (100 mg total) by mouth 2 (two) times daily as needed for cough.  Dispense: 30 capsule; Refill: 1     I have discontinued Ms. Mccarrick's traZODone, rosuvastatin, and DULoxetine. I am also having her maintain her albuterol, estradiol, promethazine, venlafaxine XR, rizatriptan, and clonazePAM.   No orders of the defined types were placed in this encounter.   Return precautions given.   Risks, benefits, and alternatives of the medications and treatment plan prescribed today were discussed, and patient expressed understanding.   Education regarding symptom management and diagnosis given to patient on AVS.  Continue  to follow with Crecencio Mc, MD for routine health maintenance.   Carol Ada and I agreed with plan.   Mable Paris, FNP

## 2017-01-24 ENCOUNTER — Other Ambulatory Visit (INDEPENDENT_AMBULATORY_CARE_PROVIDER_SITE_OTHER): Payer: BLUE CROSS/BLUE SHIELD

## 2017-01-24 DIAGNOSIS — E559 Vitamin D deficiency, unspecified: Secondary | ICD-10-CM

## 2017-01-24 DIAGNOSIS — M791 Myalgia, unspecified site: Secondary | ICD-10-CM

## 2017-01-24 DIAGNOSIS — E785 Hyperlipidemia, unspecified: Secondary | ICD-10-CM

## 2017-01-24 DIAGNOSIS — IMO0002 Reserved for concepts with insufficient information to code with codable children: Secondary | ICD-10-CM

## 2017-01-24 DIAGNOSIS — R740 Nonspecific elevation of levels of transaminase and lactic acid dehydrogenase [LDH]: Secondary | ICD-10-CM | POA: Diagnosis not present

## 2017-01-24 DIAGNOSIS — D708 Other neutropenia: Secondary | ICD-10-CM | POA: Diagnosis not present

## 2017-01-24 LAB — COMPREHENSIVE METABOLIC PANEL
ALK PHOS: 74 U/L (ref 39–117)
ALT: 13 U/L (ref 0–35)
AST: 17 U/L (ref 0–37)
Albumin: 4.5 g/dL (ref 3.5–5.2)
BILIRUBIN TOTAL: 0.3 mg/dL (ref 0.2–1.2)
BUN: 9 mg/dL (ref 6–23)
CALCIUM: 9.4 mg/dL (ref 8.4–10.5)
CO2: 25 mEq/L (ref 19–32)
CREATININE: 0.82 mg/dL (ref 0.40–1.20)
Chloride: 102 mEq/L (ref 96–112)
GFR: 76.13 mL/min (ref >60.00)
GLUCOSE: 93 mg/dL (ref 70–99)
Potassium: 4.5 mEq/L (ref 3.5–5.1)
Sodium: 137 mEq/L (ref 135–145)
TOTAL PROTEIN: 7.3 g/dL (ref 6.0–8.3)

## 2017-01-24 LAB — LIPID PANEL
CHOL/HDL RATIO: 6
CHOLESTEROL: 390 mg/dL — AB (ref 0–200)
HDL: 62.1 mg/dL (ref >39.00)
LDL Cholesterol: 307 mg/dL — ABNORMAL HIGH (ref 0–99)
NonHDL: 328.02
TRIGLYCERIDES: 106 mg/dL (ref 0.0–149.0)
VLDL: 21.2 mg/dL (ref 0.0–40.0)

## 2017-01-24 LAB — CBC WITH DIFFERENTIAL/PLATELET
Basophils Absolute: 0 10*3/uL (ref 0.0–0.1)
Basophils Relative: 0.6 % (ref 0.0–3.0)
Eosinophils Absolute: 0.1 10*3/uL (ref 0.0–0.7)
Eosinophils Relative: 1 % (ref 0.0–5.0)
HEMATOCRIT: 40.7 % (ref 36.0–46.0)
Hemoglobin: 13.4 g/dL (ref 12.0–15.0)
LYMPHS PCT: 41.9 % (ref 12.0–46.0)
Lymphs Abs: 2.1 10*3/uL (ref 0.7–4.0)
MCHC: 32.8 g/dL (ref 30.0–36.0)
MCV: 85.4 fl (ref 78.0–100.0)
Monocytes Absolute: 0.3 10*3/uL (ref 0.1–1.0)
Monocytes Relative: 5.8 % (ref 3.0–12.0)
Neutro Abs: 2.6 10*3/uL (ref 1.4–7.7)
Neutrophils Relative %: 50.7 % (ref 43.0–77.0)
Platelets: 287 10*3/uL (ref 150.0–400.0)
RBC: 4.77 Mil/uL (ref 3.87–5.11)
RDW: 14.1 % (ref 11.5–15.5)
WBC: 5.1 10*3/uL (ref 4.0–10.5)

## 2017-01-24 LAB — VITAMIN D 25 HYDROXY (VIT D DEFICIENCY, FRACTURES): VITD: 12.87 ng/mL — ABNORMAL LOW (ref 30.00–100.00)

## 2017-01-24 LAB — TSH: TSH: 1.11 u[IU]/mL (ref 0.35–4.50)

## 2017-01-24 LAB — CK: CK TOTAL: 79 U/L (ref 7–177)

## 2017-01-24 LAB — VITAMIN B12: Vitamin B-12: 287 pg/mL (ref 211–911)

## 2017-01-26 ENCOUNTER — Encounter: Payer: Self-pay | Admitting: Internal Medicine

## 2017-01-26 ENCOUNTER — Ambulatory Visit (INDEPENDENT_AMBULATORY_CARE_PROVIDER_SITE_OTHER): Payer: BLUE CROSS/BLUE SHIELD | Admitting: Internal Medicine

## 2017-01-26 VITALS — BP 126/80 | HR 78 | Resp 16 | Ht 63.0 in | Wt 163.0 lb

## 2017-01-26 DIAGNOSIS — F5105 Insomnia due to other mental disorder: Secondary | ICD-10-CM | POA: Diagnosis not present

## 2017-01-26 DIAGNOSIS — F329 Major depressive disorder, single episode, unspecified: Secondary | ICD-10-CM

## 2017-01-26 DIAGNOSIS — F418 Other specified anxiety disorders: Secondary | ICD-10-CM | POA: Diagnosis not present

## 2017-01-26 DIAGNOSIS — Z79899 Other long term (current) drug therapy: Secondary | ICD-10-CM | POA: Diagnosis not present

## 2017-01-26 DIAGNOSIS — Z0000 Encounter for general adult medical examination without abnormal findings: Secondary | ICD-10-CM

## 2017-01-26 DIAGNOSIS — E785 Hyperlipidemia, unspecified: Secondary | ICD-10-CM | POA: Diagnosis not present

## 2017-01-26 DIAGNOSIS — Z1231 Encounter for screening mammogram for malignant neoplasm of breast: Secondary | ICD-10-CM | POA: Diagnosis not present

## 2017-01-26 DIAGNOSIS — F99 Mental disorder, not otherwise specified: Secondary | ICD-10-CM

## 2017-01-26 DIAGNOSIS — F419 Anxiety disorder, unspecified: Secondary | ICD-10-CM

## 2017-01-26 DIAGNOSIS — E7800 Pure hypercholesterolemia, unspecified: Secondary | ICD-10-CM | POA: Diagnosis not present

## 2017-01-26 DIAGNOSIS — Z1239 Encounter for other screening for malignant neoplasm of breast: Secondary | ICD-10-CM

## 2017-01-26 DIAGNOSIS — F32A Depression, unspecified: Secondary | ICD-10-CM

## 2017-01-26 MED ORDER — PRAVASTATIN SODIUM 20 MG PO TABS: 20.0000 mg | ORAL_TABLET | Freq: Every day | ORAL | 3 refills | Status: DC

## 2017-01-26 MED ORDER — CLONAZEPAM 1 MG PO TABS: ORAL_TABLET | ORAL | 5 refills | Status: DC

## 2017-01-26 NOTE — Progress Notes (Signed)
Patient ID: Nichole Riddle, female    DOB: [birth year]  Age: 56 y.o. MRN: [id number]  The patient is here for her annual  wellness examination and management of other chronic and acute problems.    PAP NORMAL 2015 at [facility] side   MAMMOGRAM MARCH 2017 at [facility] side  COLONOSCOPY 2016   LIPIDS DISCUSSED . WILLING TO TRY PRAVASTATIN ANXIETY: CLONAZEPAM TAKEN on  NIGHT 3 OF INSOMNIA LOTS OF IRRITABILITY,  SEEING A THERAPIST,  (CY[facility]IA [facility]IN AT [facility]) DOES NOT WANT TO INCREASE HER EFFEXOR.   The risk factors are reflected in the social history.  The roster of all physicians providing medical care to patient - is listed in the Snapshot section of the chart.  Home safety : The patient has smoke detectors in the home. They wear seatbelts.  There are no firearms at home. There is no violence in the home.   There is no risks for hepatitis, STDs or HIV. There is no   history of blood transfusion. They have no travel history to infectious disease endemic areas of the world.  The patient has seen their dentist in the last six month. They have seen their eye doctor in the last year.  They do not  have excessive sun exposure. Discussed the need for sun protection: hats, long sleeves and use of sunscreen if there is significant sun exposure.   Diet: the importance of a healthy diet is discussed. They do have a healthy diet.  The benefits of regular aerobic exercise were discussed. She walks 3 times per week ,  20 minutes.   Depression screen: there are no signs or vegative symptoms of untreated depression.  The following portions of the patient's history were reviewed and updated as appropriate: allergies, current medications, past family history, past medical history,  past surgical history, past social history  and problem list.  Visual acuity was not assessed per patient preference since she has regular follow up with her ophthalmologist. Hearing and body mass index were assessed and reviewed.    During the course of the visit the patient was educated and counseled about appropriate screening and preventive services including : fall prevention , diabetes screening, nutrition counseling, colorectal cancer screening, and recommended immunizations.    CC: The primary encounter diagnosis was Breast cancer screening. Diagnoses of Pure hypercholesterolemia, Long-term use of high-risk medication, Anxiety and depression, Encounter for preventive health examination, Hyperlipidemia with target LDL less than 130, and Insomnia due to other mental disorder were also pertinent to this visit.  Treated for flu and bronchitis one month ago  Anxiety  No migraines in a while  IBS managed with increased water intake,  miralax x 3 days, daily probiotic. Avoids sweets including chocolate which result in change in BM  bp elevated today due to argument with fiancee before visit   Social History   Social History Narrative   She is engaged to be married,   Her fiancee has a 5 yr old son and a a bitter ex wife.     She resigned from [employer] due to the stress level, and has been  self employed as a Art therapist.  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      History Harveen has a past medical history of Anxiety (1997); Bulimia (1997); Depression (1997); Gastric ulcer (2012); Gastric ulcer due to Helicobacter pylori (0000000); History of cardiac catheterization (2012); HLD (hyperlipidemia); IBS (irritable bowel syndrome) (06/17/2015); Irritable  bowel; Migraines; and Multiple gastric ulcers.   She has a past surgical history that includes Abdominal hysterectomy; Esophagogastroduodenoscopy; and Colonoscopy.   Her family history includes Celiac disease in her brother; Esophageal cancer in her maternal grandmother; Heart disease (age of onset: 65) in her brother; Heart disease (age of onset: 68) in her father; Irritable bowel syndrome in her mother; Stomach cancer in her  maternal grandmother; Ulcerative colitis in her mother.She reports that she has never smoked. She has never used smokeless tobacco. She reports that she drinks about 1.2 oz of alcohol per week . She reports that she does not use drugs.  Outpatient Medications Prior to Visit  Medication Sig Dispense Refill  . albuterol (PROVENTIL HFA;VENTOLIN HFA) 108 (90 BASE) MCG/ACT inhaler Inhale 2 puffs into the lungs every 6 (six) hours as needed for wheezing or shortness of breath. 1 Inhaler 2  . benzonatate (TESSALON PERLES) 100 MG capsule Take 1 capsule (100 mg total) by mouth 2 (two) times daily as needed for cough. 30 capsule 1  . estradiol (ESTRACE VAGINAL) 0.1 MG/GM vaginal cream Place 1 Applicatorful vaginally at bedtime. FOR 2 WEEKS,  Then reduce use to twice weekly 42.5 g 12  . promethazine (PHENERGAN) 25 MG tablet Take 1 tablet (25 mg total) by mouth every 8 (eight) hours as needed for nausea or vomiting. 30 tablet 0  . rizatriptan (MAXALT-MLT) 10 MG disintegrating tablet DISSOLVE 1 TABLET ON THE TONGUE AS NEEDED FOR MIGRAINE, MAY REPEAT IN 2 HOURS IF NEEDED 10 tablet 2  . venlafaxine XR (EFFEXOR-XR) 37.5 MG 24 hr capsule TAKE 1 CAPSULE (37.5 MG TOTAL) BY MOUTH DAILY WITH BREAKFAST. INCREASE TO 2 CAPSULES AFTER TWO WEEKS 180 capsule 1  . clonazePAM (KLONOPIN) 0.5 MG tablet TAKE 1 TABLET BY MOUTH TWICE A DAY AS NEEDED FOR ANXIETY 60 tablet 2  . predniSONE (DELTASONE) 10 MG tablet Take 40 mg by mouth on day 1, then taper 10 mg daily until gone 10 tablet 0   No facility-administered medications prior to visit.     Review of Systems   Patient denies headache, fevers, malaise, unintentional weight loss, skin rash, eye pain, sinus congestion and sinus pain, sore throat, dysphagia,  hemoptysis , cough, dyspnea, wheezing, chest pain, palpitations, orthopnea, edema, abdominal pain, nausea, melena, diarrhea, constipation, flank pain, dysuria, hematuria, urinary  Frequency, nocturia, numbness, tingling,  seizures,  Focal weakness, Loss of consciousness,  Tremor, , depression, and suicidal ideation.      Objective:  BP 126/80   Pulse 78   Resp 16   Ht 5\' 3"  (1.6 m)   Wt 163 lb (73.9 kg)   SpO2 98%   BMI 28.87 kg/m   Physical Exam   General appearance: alert, cooperative and appears stated age Head: Normocephalic, without obvious abnormality, atraumatic Eyes: conjunctivae/corneas clear. PERRL, EOM's intact. Fundi benign. Ears: normal TM's and external ear canals both ears Nose: Nares normal. Septum midline. Mucosa normal. No drainage or sinus tenderness. Throat: lips, mucosa, and tongue normal; teeth and gums normal Neck: no adenopathy, no carotid bruit, no JVD, supple, symmetrical, trachea midline and thyroid not enlarged, symmetric, no tenderness/mass/nodules Lungs: clear to auscultation bilaterally Breasts: normal appearance, no masses or tenderness Heart: regular rate and rhythm, S1, S2 normal, no murmur, click, rub or gallop Abdomen: soft, non-tender; bowel sounds normal; no masses,  no organomegaly Extremities: extremities normal, atraumatic, no cyanosis or edema Pulses: 2+ and symmetric Skin: Skin color, texture, turgor normal. No rashes or lesions Neurologic: Alert and oriented X  3, normal strength and tone. Normal symmetric reflexes. Normal coordination and gait.      Assessment & Plan:   Problem List Items Addressed This Visit    Anxiety and depression    Continue prn clonazepam and daily Effexor .  She never started [facility] for signs and symptoms suggestive of FM.       Encounter for preventive health examination    Annual comprehensive preventive exam was done as well as an evaluation and management of chronic conditions .  During the course of the visit the patient was educated and counseled about appropriate screening and preventive services including :  diabetes screening, lipid analysis with projected  10 year  risk for CAD , nutrition counseling, breast,  cervical and colorectal cancer screening, and recommended immunizations.  Printed recommendations for health maintenance screenings was given      Hyperlipidemia with target LDL less than 130    Untreated LDL is over 300    She is willing to resume trial of statin with pravastatin       Relevant Medications   pravastatin (PRAVACHOL) 20 MG tablet   Insomnia    Stopping alprazolam,  Did not tolerate trazodone trial.  She is using clonazepam.  The risks and benefits of benzodiazepine use were reviewed with patient today including excessive sedation leading to respiratory depression,  impaired thinking/driving, and addiction.  Patient was advised to avoid concurrent use with alcohol, to use medication only as needed and not to share with others  .        Other Visit Diagnoses    Breast cancer screening    -  Primary   Relevant Orders   MM SCREENING BREAST TOMO BILATERAL   Pure hypercholesterolemia       Relevant Medications   pravastatin (PRAVACHOL) 20 MG tablet   Other Relevant Orders   Lipid panel   Long-term use of high-risk medication       Relevant Orders   Comprehensive metabolic panel      I have discontinued Ms. Rosen's predniSONE. I have also changed her clonazePAM. Additionally, I am having her start on pravastatin. Lastly, I am having her maintain her albuterol, estradiol, promethazine, venlafaxine XR, rizatriptan, and benzonatate.  Meds ordered this encounter  Medications  . pravastatin (PRAVACHOL) 20 MG tablet    Sig: Take 1 tablet (20 mg total) by mouth daily.    Dispense:  90 tablet    Refill:  3  . clonazePAM (KLONOPIN) 1 MG tablet    Sig: TAKE 1 TABLET BY MOUTH  AT BEDTIME AS NEEDED FOR ANXIETY    Dispense:  30 tablet    Refill:  5    Not to exceed 3 additional fills before 01/08/2017    Medications Discontinued During This Encounter  Medication Reason  . predniSONE (DELTASONE) 10 MG tablet Completed Course  . clonazePAM (KLONOPIN) 0.5 MG tablet Reorder     Follow-up: Return in about 6 weeks (around 03/09/2017), or fasting labs,   6 months OV .   Crecencio Mc, MD

## 2017-01-26 NOTE — Progress Notes (Signed)
Pre visit review using our clinic review tool, if applicable. No additional management support is needed unless otherwise documented below in the visit note. 

## 2017-01-26 NOTE — Patient Instructions (Signed)
3d Mammogram has bee ordered for Nichole Riddle  I have increased the clonazepam dose to 1 mg   We discussed a TRIAL of pravastatin 20 mg daily for your LDL of 300 (total cholesterol 390)  If you tolerate it,  Return in 6 weeks for fasting labs     Health Maintenance for Postmenopausal Women Menopause is a normal process in which your reproductive ability comes to an end. This process happens gradually over a span of months to years, usually between the ages of 10 and 16. Menopause is complete when you have missed 12 consecutive menstrual periods. It is important to talk with your health care provider about some of the most common conditions that affect postmenopausal women, such as heart disease, cancer, and bone loss (osteoporosis). Adopting a healthy lifestyle and getting preventive care can help to promote your health and wellness. Those actions can also lower your chances of developing some of these common conditions. What should I know about menopause? During menopause, you may experience a number of symptoms, such as:  Moderate-to-severe hot flashes.  Night sweats.  Decrease in sex drive.  Mood swings.  Headaches.  Tiredness.  Irritability.  Memory problems.  Insomnia. Choosing to treat or not to treat menopausal changes is an individual decision that you make with your health care provider. What should I know about hormone replacement therapy and supplements? Hormone therapy products are effective for treating symptoms that are associated with menopause, such as hot flashes and night sweats. Hormone replacement carries certain risks, especially as you become older. If you are thinking about using estrogen or estrogen with progestin treatments, discuss the benefits and risks with your health care provider. What should I know about heart disease and stroke? Heart disease, heart attack, and stroke become more likely as you age. This may be due, in part, to the hormonal changes  that your body experiences during menopause. These can affect how your body processes dietary fats, triglycerides, and cholesterol. Heart attack and stroke are both medical emergencies. There are many things that you can do to help prevent heart disease and stroke:  Have your blood pressure checked at least every 1-2 years. High blood pressure causes heart disease and increases the risk of stroke.  If you are 38-58 years old, ask your health care provider if you should take aspirin to prevent a heart attack or a stroke.  Do not use any tobacco products, including cigarettes, chewing tobacco, or electronic cigarettes. If you need help quitting, ask your health care provider.  It is important to eat a healthy diet and maintain a healthy weight.  Be sure to include plenty of vegetables, fruits, low-fat dairy products, and lean protein.  Avoid eating foods that are high in solid fats, added sugars, or salt (sodium).  Get regular exercise. This is one of the most important things that you can do for your health.  Try to exercise for at least 150 minutes each week. The type of exercise that you do should increase your heart rate and make you sweat. This is known as moderate-intensity exercise.  Try to do strengthening exercises at least twice each week. Do these in addition to the moderate-intensity exercise.  Know your numbers.Ask your health care provider to check your cholesterol and your blood glucose. Continue to have your blood tested as directed by your health care provider. What should I know about cancer screening? There are several types of cancer. Take the following steps to reduce your risk and to  catch any cancer development as early as possible. Breast Cancer  Practice breast self-awareness.  This means understanding how your breasts normally appear and feel.  It also means doing regular breast self-exams. Let your health care provider know about any changes, no matter how  small.  If you are 50 or older, have a clinician do a breast exam (clinical breast exam or CBE) every year. Depending on your age, family history, and medical history, it may be recommended that you also have a yearly breast X-ray (mammogram).  If you have a family history of breast cancer, talk with your health care provider about genetic screening.  If you are at high risk for breast cancer, talk with your health care provider about having an MRI and a mammogram every year.  Breast cancer (BRCA) gene test is recommended for women who have family members with BRCA-related cancers. Results of the assessment will determine the need for genetic counseling and BRCA1 and for BRCA2 testing. BRCA-related cancers include these types:  Breast. This occurs in males or females.  Ovarian.  Tubal. This may also be called fallopian tube cancer.  Cancer of the abdominal or pelvic lining (peritoneal cancer).  Prostate.  Pancreatic. Cervical, Uterine, and Ovarian Cancer  Your health care provider may recommend that you be screened regularly for cancer of the pelvic organs. These include your ovaries, uterus, and vagina. This screening involves a pelvic exam, which includes checking for microscopic changes to the surface of your cervix (Pap test).  For women ages 21-65, health care providers may recommend a pelvic exam and a Pap test every three years. For women ages 53-65, they may recommend the Pap test and pelvic exam, combined with testing for human papilloma virus (HPV), every five years. Some types of HPV increase your risk of cervical cancer. Testing for HPV may also be done on women of any age who have unclear Pap test results.  Other health care providers may not recommend any screening for nonpregnant women who are considered low risk for pelvic cancer and have no symptoms. Ask your health care provider if a screening pelvic exam is right for you.  If you have had past treatment for cervical  cancer or a condition that could lead to cancer, you need Pap tests and screening for cancer for at least 20 years after your treatment. If Pap tests have been discontinued for you, your risk factors (such as having a new sexual partner) need to be reassessed to determine if you should start having screenings again. Some women have medical problems that increase the chance of getting cervical cancer. In these cases, your health care provider may recommend that you have screening and Pap tests more often.  If you have a family history of uterine cancer or ovarian cancer, talk with your health care provider about genetic screening.  If you have vaginal bleeding after reaching menopause, tell your health care provider.  There are currently no reliable tests available to screen for ovarian cancer. Lung Cancer  Lung cancer screening is recommended for adults 24-60 years old who are at high risk for lung cancer because of a history of smoking. A yearly low-dose CT scan of the lungs is recommended if you:  Currently smoke.  Have a history of at least 30 pack-years of smoking and you currently smoke or have quit within the past 15 years. A pack-year is smoking an average of one pack of cigarettes per day for one year. Yearly screening should:  Continue  until it has been 15 years since you quit.  Stop if you develop a health problem that would prevent you from having lung cancer treatment. Colorectal Cancer  This type of cancer can be detected and can often be prevented.  Routine colorectal cancer screening usually begins at age 82 and continues through age 63.  If you have risk factors for colon cancer, your health care provider may recommend that you be screened at an earlier age.  If you have a family history of colorectal cancer, talk with your health care provider about genetic screening.  Your health care provider may also recommend using home test kits to check for hidden blood in your  stool.  A small camera at the end of a tube can be used to examine your colon directly (sigmoidoscopy or colonoscopy). This is done to check for the earliest forms of colorectal cancer.  Direct examination of the colon should be repeated every 5-10 years until age 63. However, if early forms of precancerous polyps or small growths are found or if you have a family history or genetic risk for colorectal cancer, you may need to be screened more often. Skin Cancer  Check your skin from head to toe regularly.  Monitor any moles. Be sure to tell your health care provider:  About any new moles or changes in moles, especially if there is a change in a mole's shape or color.  If you have a mole that is larger than the size of a pencil eraser.  If any of your family members has a history of skin cancer, especially at a young age, talk with your health care provider about genetic screening.  Always use sunscreen. Apply sunscreen liberally and repeatedly throughout the day.  Whenever you are outside, protect yourself by wearing long sleeves, pants, a wide-brimmed hat, and sunglasses. What should I know about osteoporosis? Osteoporosis is a condition in which bone destruction happens more quickly than new bone creation. After menopause, you may be at an increased risk for osteoporosis. To help prevent osteoporosis or the bone fractures that can happen because of osteoporosis, the following is recommended:  If you are 75-30 years old, get at least 1,000 mg of calcium and at least 600 mg of vitamin D per day.  If you are older than age 22 but younger than age 36, get at least 1,200 mg of calcium and at least 600 mg of vitamin D per day.  If you are older than age 23, get at least 1,200 mg of calcium and at least 800 mg of vitamin D per day. Smoking and excessive alcohol intake increase the risk of osteoporosis. Eat foods that are rich in calcium and vitamin D, and do weight-bearing exercises several  times each week as directed by your health care provider. What should I know about how menopause affects my mental health? Depression may occur at any age, but it is more common as you become older. Common symptoms of depression include:  Low or sad mood.  Changes in sleep patterns.  Changes in appetite or eating patterns.  Feeling an overall lack of motivation or enjoyment of activities that you previously enjoyed.  Frequent crying spells. Talk with your health care provider if you think that you are experiencing depression. What should I know about immunizations? It is important that you get and maintain your immunizations. These include:  Tetanus, diphtheria, and pertussis (Tdap) booster vaccine.  Influenza every year before the flu season begins.  Pneumonia vaccine.  Shingles vaccine. Your health care provider may also recommend other immunizations. This information is not intended to replace advice given to you by your health care provider. Make sure you discuss any questions you have with your health care provider. Document Released: 01/07/2006 Document Revised: 06/04/2016 Document Reviewed: 08/19/2015 Elsevier Interactive Patient Education  2017 [employer].

## 2017-01-27 ENCOUNTER — Other Ambulatory Visit: Payer: Self-pay | Admitting: Internal Medicine

## 2017-01-27 ENCOUNTER — Encounter: Payer: Self-pay | Admitting: Internal Medicine

## 2017-01-27 MED ORDER — ERGOCALCIFEROL 1.25 MG (50000 UT) PO CAPS: 50000.0000 [IU] | ORAL_CAPSULE | ORAL | 2 refills | Status: DC

## 2017-01-27 MED ORDER — "SYRINGE 25G X 1"" 3 ML MISC": 0 refills | Status: AC

## 2017-01-27 MED ORDER — CYANOCOBALAMIN 1000 MCG/ML IJ SOLN: 1000.0000 ug | INTRAMUSCULAR | 3 refills | Status: DC

## 2017-01-27 NOTE — Assessment & Plan Note (Signed)
Annual comprehensive preventive exam was done as well as an evaluation and management of chronic conditions .  During the course of the visit the patient was educated and counseled about appropriate screening and preventive services including :  diabetes screening, lipid analysis with projected  10 year  risk for CAD , nutrition counseling, breast, cervical and colorectal cancer screening, and recommended immunizations.  Printed recommendations for health maintenance screenings was given 

## 2017-01-27 NOTE — Assessment & Plan Note (Signed)
Untreated LDL is over 300    She is willing to resume trial of statin with pravastatin

## 2017-01-27 NOTE — Assessment & Plan Note (Addendum)
Stopping alprazolam,  Did not tolerate trazodone trial.  She is using clonazepam.  The risks and benefits of benzodiazepine use were reviewed with patient today including excessive sedation leading to respiratory depression,  impaired thinking/driving, and addiction.  Patient was advised to avoid concurrent use with alcohol, to use medication only as needed and not to share with others  .

## 2017-01-27 NOTE — Assessment & Plan Note (Addendum)
Continue prn clonazepam and daily Effexor .  She never started [facility] for signs and symptoms suggestive of FM.

## 2017-02-01 ENCOUNTER — Encounter: Payer: Self-pay | Admitting: Internal Medicine

## 2017-03-03 ENCOUNTER — Ambulatory Visit
Admission: RE | Admit: 2017-03-03 | Discharge: 2017-03-03 | Disposition: A | Discharge status: Home / Self Care | Payer: BLUE CROSS/BLUE SHIELD | Source: Ambulatory Visit | Attending: Internal Medicine | Admitting: Internal Medicine

## 2017-03-03 DIAGNOSIS — Z1231 Encounter for screening mammogram for malignant neoplasm of breast: Secondary | ICD-10-CM | POA: Diagnosis not present

## 2017-03-03 DIAGNOSIS — Z1239 Encounter for other screening for malignant neoplasm of breast: Secondary | ICD-10-CM

## 2017-03-09 ENCOUNTER — Ambulatory Visit: Payer: Self-pay | Admitting: Internal Medicine

## 2017-03-10 ENCOUNTER — Inpatient Hospital Stay
Admission: RE | Admit: 2017-03-10 | Discharge: 2017-03-10 | Disposition: A | Discharge status: Home / Self Care | Payer: Self-pay | Source: Ambulatory Visit | Attending: *Deleted | Admitting: *Deleted

## 2017-03-10 ENCOUNTER — Other Ambulatory Visit: Payer: Self-pay | Admitting: *Deleted

## 2017-03-10 DIAGNOSIS — Z9289 Personal history of other medical treatment: Secondary | ICD-10-CM

## 2017-03-11 ENCOUNTER — Other Ambulatory Visit (INDEPENDENT_AMBULATORY_CARE_PROVIDER_SITE_OTHER): Payer: BLUE CROSS/BLUE SHIELD

## 2017-03-11 DIAGNOSIS — E7800 Pure hypercholesterolemia, unspecified: Secondary | ICD-10-CM | POA: Diagnosis not present

## 2017-03-11 DIAGNOSIS — Z79899 Other long term (current) drug therapy: Secondary | ICD-10-CM | POA: Diagnosis not present

## 2017-03-11 LAB — COMPREHENSIVE METABOLIC PANEL
ALK PHOS: 69 U/L (ref 39–117)
ALT: 14 U/L (ref 0–35)
AST: 18 U/L (ref 0–37)
Albumin: 4.4 g/dL (ref 3.5–5.2)
BILIRUBIN TOTAL: 0.4 mg/dL (ref 0.2–1.2)
BUN: 8 mg/dL (ref 6–23)
CALCIUM: 9.3 mg/dL (ref 8.4–10.5)
CHLORIDE: 102 meq/L (ref 96–112)
CO2: 27 mEq/L (ref 19–32)
CREATININE: 0.89 mg/dL (ref 0.40–1.20)
GFR: 69.23 mL/min (ref >60.00)
Glucose, Bld: 99 mg/dL (ref 70–99)
POTASSIUM: 4 meq/L (ref 3.5–5.1)
Sodium: 137 mEq/L (ref 135–145)
TOTAL PROTEIN: 7.4 g/dL (ref 6.0–8.3)

## 2017-03-11 LAB — LIPID PANEL
CHOLESTEROL: 323 mg/dL — AB (ref 0–200)
HDL: 58.5 mg/dL (ref >39.00)
LDL Cholesterol: 239 mg/dL — ABNORMAL HIGH (ref 0–99)
NonHDL: 264.19
TRIGLYCERIDES: 124 mg/dL (ref 0.0–149.0)
Total CHOL/HDL Ratio: 6
VLDL: 24.8 mg/dL (ref 0.0–40.0)

## 2017-03-14 ENCOUNTER — Encounter: Payer: Self-pay | Admitting: Internal Medicine

## 2017-03-25 ENCOUNTER — Telehealth: Payer: Self-pay | Admitting: *Deleted

## 2017-03-25 MED ORDER — ESTRADIOL 0.1 MG/GM VA CREA: 1.0000 | TOPICAL_CREAM | Freq: Every day | VAGINAL | 12 refills | Status: DC

## 2017-03-25 NOTE — Telephone Encounter (Signed)
Medication Refill requested for :estradiol cream  Pharmacy:[facility]  Return Contact :[phone]

## 2017-03-25 NOTE — Telephone Encounter (Signed)
refilled 

## 2017-04-21 ENCOUNTER — Telehealth: Payer: Self-pay | Admitting: Internal Medicine

## 2017-04-21 NOTE — Telephone Encounter (Signed)
Pt called about the B12 dosage of once a month is not working she does not have any energy this has been going on for about one month. This week has been very rough for her. Please advise?  Call pt @ [phone]. Thank you!

## 2017-04-22 NOTE — Telephone Encounter (Signed)
Looks like pt does b12 injections once a month. Please advise.

## 2017-04-22 NOTE — Telephone Encounter (Signed)
LMTCB

## 2017-04-22 NOTE — Telephone Encounter (Signed)
The dose is 1000 mcg injectible,  She can change the frequency to once a week

## 2017-04-26 MED ORDER — CYANOCOBALAMIN 1000 MCG/ML IJ SOLN: 1000.0000 ug | INTRAMUSCULAR | 3 refills | Status: DC

## 2017-04-26 NOTE — Telephone Encounter (Signed)
[facility]  

## 2017-04-26 NOTE — Telephone Encounter (Signed)
Pt called back returning your call. Thank you! °

## 2017-04-26 NOTE — Telephone Encounter (Signed)
Spoke with pt and she stated that she would like to go back to taking the injections once weekly.

## 2017-05-16 ENCOUNTER — Other Ambulatory Visit: Payer: Self-pay | Admitting: Internal Medicine

## 2017-05-16 NOTE — Telephone Encounter (Signed)
Refilled: 10/25/2016 Last OV: 01/26/2017 Next OV: not scheduled.

## 2017-05-16 NOTE — Telephone Encounter (Signed)
Pt called requesting a refill on her rizatriptan (MAXALT-MLT) 10 MG disintegrating tablet. Please advise, thank you!  [place], [place]  Call pt @ [phone]

## 2017-05-17 MED ORDER — RIZATRIPTAN BENZOATE 10 MG PO TBDP: ORAL_TABLET | ORAL | 5 refills | Status: DC

## 2017-06-15 ENCOUNTER — Encounter: Payer: Self-pay | Admitting: Family

## 2017-06-15 ENCOUNTER — Ambulatory Visit (INDEPENDENT_AMBULATORY_CARE_PROVIDER_SITE_OTHER): Payer: BLUE CROSS/BLUE SHIELD | Admitting: Family

## 2017-06-15 ENCOUNTER — Telehealth: Payer: Self-pay | Admitting: *Deleted

## 2017-06-15 VITALS — BP 116/74 | HR 90 | Temp 98.4°F | Ht 63.0 in | Wt 165.4 lb

## 2017-06-15 DIAGNOSIS — M5441 Lumbago with sciatica, right side: Secondary | ICD-10-CM

## 2017-06-15 DIAGNOSIS — G8929 Other chronic pain: Secondary | ICD-10-CM | POA: Diagnosis not present

## 2017-06-15 MED ORDER — PREDNISONE 10 MG PO TABS: ORAL_TABLET | ORAL | 0 refills | Status: DC

## 2017-06-15 MED ORDER — LIDOCAINE 5 % EX PTCH: 1.0000 | MEDICATED_PATCH | CUTANEOUS | 0 refills | Status: DC

## 2017-06-15 MED ORDER — CYCLOBENZAPRINE HCL 5 MG PO TABS: 5.0000 mg | ORAL_TABLET | Freq: Three times a day (TID) | ORAL | 1 refills | Status: DC | PRN

## 2017-06-15 NOTE — Telephone Encounter (Signed)
Medication has been sent to correct pharmacy. 

## 2017-06-15 NOTE — Telephone Encounter (Signed)
Pt stated that her Rx's were sent to [facility] and would need to be sent to [facility]  *pt saw Joycelyn Schmid this morning

## 2017-06-15 NOTE — Assessment & Plan Note (Signed)
Radicular symptoms consistent with nerve impingement. Symptoms also consistent with sacroiliac joint dysfunction as well as trochanteric bursitis on the right hip. Patient and I discussed conservative management to start including physical therapy, muscle relaxant as needed, lidocaine patches, and prednisone if pain does not respond to latter. Return precautions given.

## 2017-06-15 NOTE — Telephone Encounter (Signed)
I will resend them.

## 2017-06-15 NOTE — Progress Notes (Signed)
Pre visit review using our clinic review tool, if applicable. No additional management support is needed unless otherwise documented below in the visit note. 

## 2017-06-15 NOTE — Progress Notes (Signed)
Subjective:    Patient ID: Nichole Riddle, female    DOB: 1961/11/09, 56 y.o.   MRN: [id number]  CC: Nichole Riddle is a 56 y.o. female who presents today for follow up.   HPI: Chronic low back pain, right hip x 2 years, worsened over the past week. Feels band of pressure around low back which wraps around hips.  When lays down on right side, feels pain and pressure. 'not excuriating'.  Cannot on right hip. Right leg numbness. No recent falls.  Tyelonol arthritis with some relief, not working as well.    [place] 10 years ago and 'cracked' her pelvis, hitting cement from 15 feet from ladder when cleaning.   No recent imaging  Owns cleaning business  No urinary incontinence, dysuria, groin pain.   no h/o cancer.    HISTORY:  Past Medical History:  Diagnosis Date  . Anxiety 1997  . Bulimia 1997   per psychology note in chart  . Depression 1997  . Gastric ulcer 2012   NSAID induced   . Gastric ulcer due to Helicobacter pylori 7829   and NSAID use  . History of cardiac catheterization 2012   normal   . HLD (hyperlipidemia)   . IBS (irritable bowel syndrome) 06/17/2015  . Irritable bowel    with alternating constipation/bloating and diarrhea  . Migraines   . Multiple gastric ulcers    esophagus   Past Surgical History:  Procedure Laterality Date  . ABDOMINAL HYSTERECTOMY    . COLONOSCOPY    . ESOPHAGOGASTRODUODENOSCOPY     Family History  Problem Relation Age of Onset  . Heart disease Father 67       died of massive mi  . Heart disease Brother 32       has had 4 MIs, cardiomyopathy  . Celiac disease Brother   . Esophageal cancer Maternal Grandmother   . Stomach cancer Maternal Grandmother   . Irritable bowel syndrome Mother   . Ulcerative colitis Mother   . Breast cancer Neg Hx     Allergies: Bactrim [sulfamethoxazole-trimethoprim]; Lipitor [atorvastatin]; Nitroglycerin; Prednisone; Topamax [topiramate]; and Vicodin [hydrocodone-acetaminophen] Current Outpatient  Prescriptions on File Prior to Visit  Medication Sig Dispense Refill  . clonazePAM (KLONOPIN) 1 MG tablet TAKE 1 TABLET BY MOUTH  AT BEDTIME AS NEEDED FOR ANXIETY 30 tablet 5  . cyanocobalamin (,VITAMIN B-12,) 1000 MCG/ML injection Inject 1 mL (1,000 mcg total) into the muscle once a week. For four weeks,, then monthly thereafter 10 mL 3  . ergocalciferol (DRISDOL) 50000 units capsule Take 1 capsule (50,000 Units total) by mouth once a week. 4 capsule 2  . estradiol (ESTRACE VAGINAL) 0.1 MG/GM vaginal cream Place 1 Applicatorful vaginally at bedtime. FOR 2 WEEKS,  Then reduce use to twice weekly 42.5 g 12  . pravastatin (PRAVACHOL) 20 MG tablet Take 1 tablet (20 mg total) by mouth daily. 90 tablet 3  . promethazine (PHENERGAN) 25 MG tablet Take 1 tablet (25 mg total) by mouth every 8 (eight) hours as needed for nausea or vomiting. 30 tablet 0  . rizatriptan (MAXALT-MLT) 10 MG disintegrating tablet DISSOLVE 1 TABLET ON THE TONGUE AS NEEDED FOR MIGRAINE, MAY REPEAT IN 2 HOURS IF NEEDED 10 tablet 5  . Syringe/Needle, Disp, (SYRINGE 3CC/25GX1") 25G X 1" 3 ML MISC Use for b12 injections 50 each 0  . [DISCONTINUED] estradiol (ESTRACE) 0.5 MG tablet Take 1 tablet (0.5 mg total) by mouth daily. 30 tablet 11  . [DISCONTINUED] simvastatin (ZOCOR) 40 MG tablet  Take 1 tablet (40 mg total) by mouth every evening. 90 tablet 2   No current facility-administered medications on file prior to visit.     Social History  Substance Use Topics  . Smoking status: Never Smoker  . Smokeless tobacco: Never Used  . Alcohol use 1.2 oz/week    2 Cans of beer per week     Comment: occasional-beer    Review of Systems  Constitutional: Negative for chills and fever.  Respiratory: Negative for cough.   Cardiovascular: Negative for chest pain and palpitations.  Gastrointestinal: Negative for nausea and vomiting.  Genitourinary: Negative for difficulty urinating.  Musculoskeletal: Positive for back pain.  Neurological:  Positive for numbness. Negative for headaches.      Objective:    BP 116/74   Pulse 90   Temp 98.4 F (36.9 C) (Oral)   Ht 5\' 3"  (1.6 m)   Wt 165 lb 6.4 oz (75 kg)   SpO2 97%   BMI 29.30 kg/m  BP Readings from Last 3 Encounters:  06/15/17 116/74  01/26/17 126/80  01/04/17 108/78   Wt Readings from Last 3 Encounters:  06/15/17 165 lb 6.4 oz (75 kg)  01/26/17 163 lb (73.9 kg)  01/04/17 165 lb 3.2 oz (74.9 kg)    Physical Exam  Constitutional: She appears well-developed and well-nourished.  Eyes: Conjunctivae are normal.  Cardiovascular: Normal rate, regular rhythm, normal heart sounds and normal pulses.   Pulmonary/Chest: Effort normal and breath sounds normal. She has no wheezes. She has no rhonchi. She has no rales.  Musculoskeletal:       Lumbar back: She exhibits normal range of motion, no tenderness, no bony tenderness, no swelling, no edema, no pain and no spasm.  Full range of motion with flexion, tension, lateral side bends. No bony tenderness. + single leg on right.   Right Hip: No limp or waddling gait. Full ROM with flexion and hip rotation in flexion.  Pain of lateral hip with  (flexion-abduction-external rotation) test. Pain with deep palpation of greater trochanter. Pain over bilateral SI joints.   No edema, erythema. Skin intact   Neurological: She is alert. She has normal strength. No sensory deficit.  Reflex Scores:      Patellar reflexes are 2+ on the right side and 2+ on the left side. Sensation and strength intact bilateral lower extremities.  Skin: Skin is warm and dry.  Psychiatric: She has a normal mood and affect. Her speech is normal and behavior is normal. Thought content normal.  Vitals reviewed.      Assessment & Plan:   Problem List Items Addressed This Visit      Nervous and Auditory   Chronic bilateral low back pain with right-sided sciatica - Primary    Radicular symptoms consistent with nerve impingement. Symptoms also consistent  with sacroiliac joint dysfunction as well as trochanteric bursitis on the right hip. Patient and I discussed conservative management to start including physical therapy, muscle relaxant as needed, lidocaine patches, and prednisone if pain does not respond to latter. Return precautions given.      Relevant Medications   cyclobenzaprine (FLEXERIL) 5 MG tablet   lidocaine (LIDODERM) 5 %   predniSONE (DELTASONE) 10 MG tablet   Other Relevant Orders   Ambulatory referral to Physical Therapy       I have discontinued Ms. Cabana's albuterol, venlafaxine XR, and benzonatate. I am also having her start on cyclobenzaprine, lidocaine, and predniSONE. Additionally, I am having her maintain her promethazine, pravastatin,  clonazePAM, SYRINGE 3CC/25GX1", ergocalciferol, estradiol, cyanocobalamin, and rizatriptan.   Meds ordered this encounter  Medications  . cyclobenzaprine (FLEXERIL) 5 MG tablet    Sig: Take 1 tablet (5 mg total) by mouth 3 (three) times daily as needed for muscle spasms.    Dispense:  30 tablet    Refill:  1    Order Specific Question:   Supervising Provider    Answer:   Deborra Medina L [[id number]]  . lidocaine (LIDODERM) 5 %    Sig: Place 1 patch onto the skin daily. Remove & Discard patch within 12 hours.    Dispense:  30 patch    Refill:  0    Order Specific Question:   Supervising Provider    Answer:   Deborra Medina L [[id number]]  . predniSONE (DELTASONE) 10 MG tablet    Sig: Take 40 mg by mouth on day 1, then taper 10 mg daily until gone    Dispense:  10 tablet    Refill:  0    Order Specific Question:   Supervising Provider    Answer:   Crecencio Mc [[id number]]    Return precautions given.   Risks, benefits, and alternatives of the medications and treatment plan prescribed today were discussed, and patient expressed understanding.   Education regarding symptom management and diagnosis given to patient on AVS.  Continue to follow with Crecencio Mc, MD for routine health  maintenance.   Carol Ada and I agreed with plan.   Mable Paris, FNP

## 2017-06-15 NOTE — Patient Instructions (Addendum)
Pleasure seeing you  Let's treat conservatively as we discussed.   Lidocaine Flexeril Prednisone if needed  Do not drive or operate heavy machinery while on muscle relaxant. Please do not drink alcohol. Only take this medication as needed for acute muscle spasm at bedtime. This medication make you feel drowsy so be very careful.  Stop taking if become too drowsy or somnolent as this puts you at risk for falls. Please contact our office with any questions.   Home exercises as we discussed below/handout.  If conservative treatment doesn't yield results, we will consult to Sports Medicine/Orthopedics for further evaluation and imaging.   If there is no improvement in your symptoms, or if there is any worsening of symptoms, or if you have any additional concerns, please return for re-evaluation; or, if we are closed, consider going to the Emergency Room for evaluation if symptoms urgent. Trochanteric Bursitis Trochanteric bursitis is a condition that causes hip pain. Trochanteric bursitis happens when fluid-filled sacs (bursae) in the hip get irritated. Normally these sacs absorb shock and help strong bands of tissue (tendons) in your hip glide smoothly over each other and over your hip bones. What are the causes? This condition results from increased friction between the hip bones and the tendons that go over them. This condition can happen if you:  Have weak hips.  Use your hip muscles too much (overuse).  Get hit in the hip.  What increases the risk? This condition is more likely to develop in:  Women.  Adults who are middle-aged or older.  People with arthritis or a spinal condition.  People with weak buttocks muscles (gluteal muscles).  People who have one leg that is shorter than the other.  People who participate in certain kinds of athletic activities, such as: ? Running sports, especially long-distance running. ? Contact sports, like football or martial arts. ? Sports  in which falls may occur, like skiing.  What are the signs or symptoms? The main symptom of this condition is pain and tenderness over the point of your hip. The pain may be:  Sharp and intense.  Dull and achy.  Felt on the outside of your thigh.  It may increase when you:  Lie on your side.  Walk or run.  Go up on stairs.  Sit.  Stand up after sitting.  Stand for long periods of time.  How is this diagnosed? This condition may be diagnosed based on:  Your symptoms.  Your medical history.  A physical exam.  Imaging tests, such as: ? X-rays to check your bones. ? An MRI or ultrasound to check your tendons and muscles.  During your physical exam, your health care provider will check the movement and strength of your hip. He or she may press on the point of your hip to check for pain. How is this treated? This condition may be treated by:  Resting.  Reducing your activity.  Avoiding activities that cause pain.  Using crutches, a cane, or a walker to decrease the strain on your hip.  Taking medicine to help with swelling.  Having medicine injected into the bursae to help with swelling.  Using ice, heat, and massage therapy for pain relief.  Physical therapy exercises for strength and flexibility.  Surgery (rare).  Follow these instructions at home: Activity  Rest.  Avoid activities that cause pain.  Return to your normal activities as told by your health care provider. Ask your health care provider what activities are safe for you.  Managing pain, stiffness, and swelling  Take over-the-counter and prescription medicines only as told by your health care provider.  If directed, apply heat to the injured area as told by your health care provider. ? Place a towel between your skin and the heat source. ? Leave the heat on for 20-30 minutes. ? Remove the heat if your skin turns bright red. This is especially important if you are unable to feel pain,  heat, or cold. You may have a greater risk of getting burned.  If directed, apply ice to the injured area: ? Put ice in a plastic bag. ? Place a towel between your skin and the bag. ? Leave the ice on for 20 minutes, 2-3 times a day. General instructions  If the affected leg is one that you use for driving, ask your health care provider when it is safe to drive.  Use crutches, a cane, or a walker as told by your health care provider.  If one of your legs is shorter than the other, get fitted for a shoe insert.  Lose weight if you are overweight. How is this prevented?  Wear supportive footwear that is appropriate for your sport.  If you have hip pain, start any new exercise or sport slowly.  Maintain physical fitness, including: ? Strength. ? Flexibility. Contact a health care provider if:  Your pain does not improve with 2-4 weeks. Get help right away if:  You develop severe pain.  You have a fever.  You develop increased redness over your hip.  You have a change in your bowel function or bladder function.  You cannot control the muscles in your feet. This information is not intended to replace advice given to you by your health care provider. Make sure you discuss any questions you have with your health care provider. Document Released: 12/23/2004 Document Revised: 07/21/2016 Document Reviewed: 10/31/2015 Elsevier Interactive Patient Education  2018 [employer].   Sacroiliac Joint Dysfunction Sacroiliac joint dysfunction is a condition that causes inflammation on one or both sides of the sacroiliac (SI) joint. The SI joint connects the lower part of the spine (sacrum) with the two upper portions of the pelvis (ilium). This condition causes deep aching or burning pain in the low back. In some cases, the pain may also spread into one or both buttocks or hips or spread down the legs. What are the causes? This condition may be caused by:  Pregnancy. During pregnancy,  extra stress is put on the SI joints because the pelvis widens.  Injury, such as: ? Car accidents. ? Sport-related injuries. ? Work-related injuries.  Having one leg that is shorter than the other.  Conditions that affect the joints, such as: ? Rheumatoid arthritis. ? Gout. ? Psoriatic arthritis. ? Joint infection (septic arthritis).  Sometimes, the cause of SI joint dysfunction is not known. What are the signs or symptoms? Symptoms of this condition include:  Aching or burning pain in the lower back. The pain may also spread to other areas, such as: ? Buttocks. ? Groin. ? Thighs and legs.  Muscle spasms in or around the painful areas.  Increased pain when standing, walking, running, stair climbing, bending, or lifting.  How is this diagnosed? Your health care provider will do a physical exam and take your medical history. During the exam, the health care provider may move one or both of your legs to different positions to check for pain. Various tests may be done to help verify the diagnosis, including:  Imaging tests to look for other causes of pain. These may include: ? MRI. ? CT scan. ? Bone scan.  Diagnostic injection. A numbing medicine is injected into the SI joint using a needle. If the pain is temporarily improved or stopped after the injection, this can indicate that SI joint dysfunction is the problem.  How is this treated? Treatment may vary depending on the cause and severity of your condition. Treatment options may include:  Applying ice or heat to the lower back area. This can help to reduce pain and muscle spasms.  Medicines to relieve pain or inflammation or to relax the muscles.  Wearing a back brace (sacroiliac brace) to help support the joint while your back is healing.  Physical therapy to increase muscle strength around the joint and flexibility at the joint. This may also involve learning proper body positions and ways of moving to relieve stress  on the joint.  Direct manipulation of the SI joint.  Injections of steroid medicine into the joint in order to reduce pain and swelling.  Radiofrequency ablation to burn away nerves that are carrying pain messages from the joint.  Use of a device that provides electrical stimulation in order to reduce pain at the joint.  Surgery to put in screws and plates that limit or prevent joint motion. This is rare.  Follow these instructions at home:  Rest as needed. Limit your activities as directed by your health care provider.  Take medicines only as directed by your health care provider.  If directed, apply ice to the affected area: ? Put ice in a plastic bag. ? Place a towel between your skin and the bag. ? Leave the ice on for 20 minutes, 2-3 times per day.  Use a heating pad or a moist heat pack as directed by your health care provider.  Exercise as directed by your health care provider or physical therapist.  Keep all follow-up visits as directed by your health care provider. This is important. Contact a health care provider if:  Your pain is not controlled with medicine.  You have a fever.  You have increasingly severe pain. Get help right away if:  You have weakness, numbness, or tingling in your legs or feet.  You lose control of your bladder or bowel. This information is not intended to replace advice given to you by your health care provider. Make sure you discuss any questions you have with your health care provider. Document Released: 02/11/2009 Document Revised: 04/22/2016 Document Reviewed: 07/23/2014 Elsevier Interactive Patient Education  [employer].

## 2017-06-16 ENCOUNTER — Telehealth: Payer: Self-pay

## 2017-06-16 NOTE — Telephone Encounter (Signed)
PA for lidocaine patch submitted on covermymeds.

## 2017-06-24 ENCOUNTER — Telehealth: Payer: Self-pay | Admitting: Internal Medicine

## 2017-06-24 ENCOUNTER — Other Ambulatory Visit: Payer: Self-pay | Admitting: Internal Medicine

## 2017-06-24 MED ORDER — CYANOCOBALAMIN 1000 MCG/ML IJ SOLN: 1000.0000 ug | INTRAMUSCULAR | 3 refills | Status: DC

## 2017-06-24 NOTE — Telephone Encounter (Signed)
Pt called about pharmacy stating pt needs a new Rx to get the B12 injection. Please advise?  Pharmacy is [facility], [place]  Call pt @ [phone]. Thank you!

## 2017-06-24 NOTE — Telephone Encounter (Signed)
Refilled. thanks

## 2017-06-24 NOTE — Telephone Encounter (Signed)
Last OV was 01/26/17, refill was the same day #30 with 5 refills. Please advise, thanks

## 2017-06-24 NOTE — Telephone Encounter (Signed)
Pt will also need to refill Effexor she would like to restart medication. Thank you!

## 2017-06-27 MED ORDER — VENLAFAXINE HCL ER 37.5 MG PO CP24: 37.5000 mg | ORAL_CAPSULE | Freq: Every day | ORAL | 0 refills | Status: DC

## 2017-06-27 MED ORDER — CYANOCOBALAMIN 1000 MCG/ML IJ SOLN: 1000.0000 ug | INTRAMUSCULAR | 3 refills | Status: DC

## 2017-06-27 NOTE — Telephone Encounter (Signed)
She should have refills until August 28 according to chart then.  Needs 6 month follow up schedule

## 2017-06-27 NOTE — Telephone Encounter (Signed)
Resent vitamin b12 to [facility]. The pt would like to restart the Effexor is it ok to send this in or does she need to schedule an appt to see you first?

## 2017-06-27 NOTE — Telephone Encounter (Signed)
Pt stated that [facility] did not receive scripts for her B12 and Effexor  Pharmacy [facility]

## 2017-06-27 NOTE — Telephone Encounter (Signed)
Refilled both,  Needs OV 1 montth

## 2017-06-27 NOTE — Telephone Encounter (Signed)
Pt called, [facility] is not receiving prescription. Fax number is [phone].

## 2017-06-27 NOTE — Telephone Encounter (Signed)
LMTCB. Need to schedule pt and follow up with Dr. Derrel Nip in one month.

## 2017-06-28 NOTE — Telephone Encounter (Signed)
Sent mychart to schedule appt, thanks

## 2017-07-19 ENCOUNTER — Other Ambulatory Visit: Payer: Self-pay | Admitting: Internal Medicine

## 2017-07-19 NOTE — Telephone Encounter (Signed)
Last OV 01/26/2017 Next OV 08/16/2017 Last refill 06/27/2017

## 2017-07-20 NOTE — Telephone Encounter (Signed)
Please schedule patient for medication follow up, thank you!

## 2017-07-20 NOTE — Telephone Encounter (Signed)
Refill for 30 days only.  OFFICE VISIT NEEDED prior to any more refills 

## 2017-07-21 NOTE — Telephone Encounter (Signed)
Printed, signed and faxed.  

## 2017-08-08 ENCOUNTER — Telehealth: Payer: Self-pay | Admitting: Internal Medicine

## 2017-08-08 ENCOUNTER — Other Ambulatory Visit: Payer: Self-pay | Admitting: Internal Medicine

## 2017-08-08 ENCOUNTER — Ambulatory Visit: Payer: BLUE CROSS/BLUE SHIELD | Admitting: Internal Medicine

## 2017-08-23 ENCOUNTER — Other Ambulatory Visit: Payer: Self-pay | Admitting: Internal Medicine

## 2017-08-23 NOTE — Telephone Encounter (Signed)
Printed, signed and faxed.  

## 2017-08-23 NOTE — Telephone Encounter (Signed)
Refill for 30 days only.   

## 2017-08-23 NOTE — Telephone Encounter (Signed)
Last refill states needed follow up for more refills, has followup on 09/19/17, please advise, thanks

## 2017-09-19 ENCOUNTER — Encounter: Payer: Self-pay | Admitting: Internal Medicine

## 2017-09-19 ENCOUNTER — Ambulatory Visit (INDEPENDENT_AMBULATORY_CARE_PROVIDER_SITE_OTHER): Payer: BLUE CROSS/BLUE SHIELD

## 2017-09-19 ENCOUNTER — Ambulatory Visit (INDEPENDENT_AMBULATORY_CARE_PROVIDER_SITE_OTHER): Payer: BLUE CROSS/BLUE SHIELD | Admitting: Internal Medicine

## 2017-09-19 VITALS — BP 110/68 | HR 68 | Temp 98.9°F | Resp 15 | Ht 63.0 in | Wt 166.0 lb

## 2017-09-19 DIAGNOSIS — E785 Hyperlipidemia, unspecified: Secondary | ICD-10-CM | POA: Diagnosis not present

## 2017-09-19 DIAGNOSIS — Z23 Encounter for immunization: Secondary | ICD-10-CM

## 2017-09-19 DIAGNOSIS — E538 Deficiency of other specified B group vitamins: Secondary | ICD-10-CM | POA: Diagnosis not present

## 2017-09-19 DIAGNOSIS — M542 Cervicalgia: Secondary | ICD-10-CM | POA: Diagnosis not present

## 2017-09-19 DIAGNOSIS — F329 Major depressive disorder, single episode, unspecified: Secondary | ICD-10-CM | POA: Diagnosis not present

## 2017-09-19 DIAGNOSIS — F419 Anxiety disorder, unspecified: Secondary | ICD-10-CM | POA: Diagnosis not present

## 2017-09-19 DIAGNOSIS — G44029 Chronic cluster headache, not intractable: Secondary | ICD-10-CM | POA: Diagnosis not present

## 2017-09-19 DIAGNOSIS — G44019 Episodic cluster headache, not intractable: Secondary | ICD-10-CM | POA: Diagnosis not present

## 2017-09-19 DIAGNOSIS — F32A Depression, unspecified: Secondary | ICD-10-CM

## 2017-09-19 DIAGNOSIS — G43009 Migraine without aura, not intractable, without status migrainosus: Secondary | ICD-10-CM

## 2017-09-19 MED ORDER — ALPRAZOLAM 0.5 MG PO TABS: 0.5000 mg | ORAL_TABLET | Freq: Two times a day (BID) | ORAL | 5 refills | Status: DC | PRN

## 2017-09-19 MED ORDER — CYANOCOBALAMIN 1000 MCG/ML IJ SOLN: 1000.0000 ug | INTRAMUSCULAR | 2 refills | Status: AC

## 2017-09-19 MED ORDER — CYANOCOBALAMIN 1000 MCG/ML IJ SOLN
1000.0000 ug | Freq: Once | INTRAMUSCULAR | Status: AC
  Administered 2017-09-19: 1000 ug via INTRAMUSCULAR

## 2017-09-19 MED ORDER — "SYRINGE 25G X 1"" 3 ML MISC": 0 refills | Status: AC

## 2017-09-19 NOTE — Progress Notes (Signed)
Subjective:  Patient ID: Nichole Riddle, female    DOB: October 14, 1961  Age: 56 y.o. MRN: [id number]  CC: The primary encounter diagnosis was Migraine without aura and without status migrainosus, not intractable. Diagnoses of Need for immunization against influenza, Chronic cluster headache, not intractable, Cervicalgia of occipito-atlanto-axial region, B12 deficiency, Episodic cluster headache, not intractable, Anxiety and depression, and Hyperlipidemia with target LDL less than 130 were also pertinent to this visit.  HPI Nichole Riddle presents for 6 month follow up on GAD managed with clonazepam and effexor , hyperlipidemia  Managed with pravastatin, with reduction in LDL from untreated 307 to  289 in April   Cc:  frequent headaches occurring 2-3 per week . Left sided completely,  Boring quality.  No auras,  Sometime the pain  is extreme,  Sometimes relieved with tylenol; if all else fails, she will use  Maxalt but the medication makes her neck feel tight.  The headaches are triggered by the sensation of something shifting inside her head.  She also reports recurrent neck pain. 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   Emotionally difficult  period,  Broke her engagement after 11 year relationship because her fiance relapsed on his cocaine abuse and started  Spending time on chat lines.  Mother has also been diagnosed  with thyroid  cancer. She is not sleeping well averaging 4 hours per night with clonazepam.  wants to resume alprazolam,  Because she has hadd a recurrence of panic attacks as well . [name]   Has not been taking pravastatin due to bone and muscle pain.  Improved with cessation of pravastatin .  Going to resume the pravastatin as a second trial.     Lab Results  Component Value Date   CHOL 323 (H) 03/11/2017   HDL 58.50 03/11/2017   LDLCALC 239 (H) 03/11/2017   LDLDIRECT 265.0 03/08/2016   TRIG  124.0 03/11/2017   CHOLHDL 6 03/11/2017       Outpatient Medications Prior to Visit  Medication Sig Dispense Refill  . ergocalciferol (DRISDOL) 50000 units capsule Take 1 capsule (50,000 Units total) by mouth once a week. 4 capsule 2  . estradiol (ESTRACE VAGINAL) 0.1 MG/GM vaginal cream Place 1 Applicatorful vaginally at bedtime. FOR 2 WEEKS,  Then reduce use to twice weekly 42.5 g 12  . pravastatin (PRAVACHOL) 20 MG tablet Take 1 tablet (20 mg total) by mouth daily. 90 tablet 3  . promethazine (PHENERGAN) 25 MG tablet Take 1 tablet (25 mg total) by mouth every 8 (eight) hours as needed for nausea or vomiting. 30 tablet 0  . rizatriptan (MAXALT-MLT) 10 MG disintegrating tablet DISSOLVE 1 TABLET ON THE TONGUE AS NEEDED FOR MIGRAINE, MAY REPEAT IN 2 HOURS IF NEEDED 10 tablet 5  . Syringe/Needle, Disp, (SYRINGE 3CC/25GX1") 25G X 1" 3 ML MISC Use for b12 injections 50 each 0  . venlafaxine XR (EFFEXOR-XR) 37.5 MG 24 hr capsule TAKE 1 CAPSULE EVERY DAY WITH BREAKFAST INCREASE TO 2 CAPS(75MG ) AFTER2 WEEKS 60 capsule 3  . clonazePAM (KLONOPIN) 1 MG tablet TAKE ONE TABLET AT BEDTIME AS NEEDED FORANXIETY 30 tablet 0  . cyanocobalamin (,VITAMIN B-12,) 1000 MCG/ML injection Inject 1 mL (1,000 mcg total) into the muscle once a week. For four weeks,, then monthly thereafter (Patient not taking: Reported on 09/19/2017) 10 mL 3  . cyclobenzaprine (FLEXERIL) 5 MG tablet  Take 1 tablet (5 mg total) by mouth 3 (three) times daily as needed for muscle spasms. (Patient not taking: Reported on 09/19/2017) 30 tablet 1  . lidocaine (LIDODERM) 5 % Place 1 patch onto the skin daily. Remove & Discard patch within 12 hours. (Patient not taking: Reported on 09/19/2017) 30 patch 0  . predniSONE (DELTASONE) 10 MG tablet Take 40 mg by mouth on day 1, then taper 10 mg daily until gone (Patient not taking: Reported on 09/19/2017) 10 tablet 0   No facility-administered medications prior to visit.     Review of  Systems;  Patient denies  fevers, malaise, unintentional weight loss, skin rash, eye pain, sinus congestion and sinus pain, sore throat, dysphagia,  hemoptysis , cough, dyspnea, wheezing, chest pain, palpitations, orthopnea, edema, abdominal pain, nausea, melena, diarrhea, constipation, flank pain, dysuria, hematuria, urinary  Frequency, nocturia, numbness, tingling, seizures,  Focal weakness, Loss of consciousness,  Tremor,  and suicidal ideation.      Objective:  BP 110/68 (BP Location: Left Arm, Patient Position: Sitting, Cuff Size: Normal)   Pulse 68   Temp 98.9 F (37.2 C) (Oral)   Resp 15   Ht 5\' 3"  (1.6 m)   Wt 166 lb (75.3 kg)   SpO2 97%   BMI 29.41 kg/m   BP Readings from Last 3 Encounters:  09/19/17 110/68  06/15/17 116/74  01/26/17 126/80    Wt Readings from Last 3 Encounters:  09/19/17 166 lb (75.3 kg)  06/15/17 165 lb 6.4 oz (75 kg)  01/26/17 163 lb (73.9 kg)    General appearance: alert, tearful, cooperative and appears stated age Ears: normal TM's and external ear canals both ears Throat: lips, mucosa, and tongue normal; teeth and gums normal Neck: no adenopathy, no carotid bruit, supple, symmetrical, trachea midline and thyroid not enlarged, symmetric, no tenderness/mass/nodules Back: symmetric, no curvature. ROM normal. No CVA tenderness. Lungs: clear to auscultation bilaterally Heart: regular rate and rhythm, S1, S2 normal, no murmur, click, rub or gallop Abdomen: soft, non-tender; bowel sounds normal; no masses,  no organomegaly Pulses: 2+ and symmetric Skin: Skin color, texture, turgor normal. No rashes or lesions Lymph nodes: Cervical, supraclavicular, and axillary nodes normal. Psych: affect sad, makes good eye contact. No fidgeting,  cries easily.  Denies suicidal thoughts   Lab Results  Component Value Date   HGBA1C 5.8 10/25/2012    Lab Results  Component Value Date   CREATININE 0.89 03/11/2017   CREATININE 0.82 01/24/2017   CREATININE 0.89  07/20/2016    Lab Results  Component Value Date   WBC 5.1 01/24/2017   HGB 13.4 01/24/2017   HCT 40.7 01/24/2017   PLT 287.0 01/24/2017   GLUCOSE 99 03/11/2017   CHOL 323 (H) 03/11/2017   TRIG 124.0 03/11/2017   HDL 58.50 03/11/2017   LDLDIRECT 265.0 03/08/2016   LDLCALC 239 (H) 03/11/2017   ALT 14 03/11/2017   AST 18 03/11/2017   NA 137 03/11/2017   K 4.0 03/11/2017   CL 102 03/11/2017   CREATININE 0.89 03/11/2017   BUN 8 03/11/2017   CO2 27 03/11/2017   TSH 1.11 01/24/2017   INR 1.0 11/08/2014   HGBA1C 5.8 10/25/2012   MICROALBUR 0.50 03/09/2013    Mm Outside Films Mammo  Result Date: 03/10/2017 This examination belongs to an outside facility and is stored here for comparison purposes only.  Contact the originating outside institution for any associated report or interpretation.   Assessment & Plan:   Problem List Items Addressed  This Visit    Anxiety and depression    With insomnia and panic attacks,  Aggravated by recent dissolution of 11 year relationship and engagement.  Resume alprazolam for panic attacks , continue Effexor 75 mg daily but will increase dose if 2-3 weeks if needed.       Relevant Medications   ALPRAZolam (XANAX) 0.5 MG tablet   Cervicalgia of occipito-atlanto-axial region    With neuralgia in a C3 distribution by history.  Plain films showing degenerative changes from C4 to C6 and muscle spasm       Relevant Orders   DG Cervical Spine Complete (Completed)   Cluster headache, episodic    Recurring now daily.  Left sided,  Boring quality,  Severe at times.  MRI brain ordered       Headache, migraine - Primary   Relevant Orders   Ambulatory referral to Neurology   Hyperlipidemia with target LDL less than 130    She is restarting pravastatin for a second trial to see if the myalgias recur.   Lab Results  Component Value Date   CHOL 323 (H) 03/11/2017   HDL 58.50 03/11/2017   LDLCALC 239 (H) 03/11/2017   LDLDIRECT 265.0 03/08/2016    TRIG 124.0 03/11/2017   CHOLHDL 6 03/11/2017          Other Visit Diagnoses    Need for immunization against influenza       Relevant Orders   Flu Vaccine QUAD 36+ mos IM (Completed)   Chronic cluster headache, not intractable       Relevant Orders   MR Brain Wo Contrast   B12 deficiency       Relevant Medications   cyanocobalamin ((VITAMIN B-12)) injection 1,000 mcg (Completed)     A total of 25 minutes of face to face time was spent with patient more than half of which was spent in counselling about the above mentioned conditions  and coordination of care   I have discontinued Ms. Holley's cyclobenzaprine, lidocaine, predniSONE, cyanocobalamin, and clonazePAM. I have also changed her ALPRAZolam. Additionally, I am having her start on cyanocobalamin and SYRINGE 3CC/25GX1". Lastly, I am having her maintain her promethazine, pravastatin, SYRINGE 3CC/25GX1", ergocalciferol, estradiol, rizatriptan, and venlafaxine XR. We administered cyanocobalamin.  Meds ordered this encounter  Medications  . ALPRAZolam (XANAX) 0.5 MG tablet    Sig: Take 1 tablet (0.5 mg total) by mouth 2 (two) times daily as needed for anxiety. TAKE 1 TABLET BY MOUTH EVERY NIGHT AT BEDTIME AS NEEDED FOR ANXIETY    Dispense:  60 tablet    Refill:  5    Not to exceed 4 additional fills before 03/14/2016  . cyanocobalamin ((VITAMIN B-12)) injection 1,000 mcg  . cyanocobalamin (,VITAMIN B-12,) 1000 MCG/ML injection    Sig: Inject 1 mL (1,000 mcg total) into the muscle once a week.    Dispense:  10 mL    Refill:  2  . Syringe/Needle, Disp, (SYRINGE 3CC/25GX1") 25G X 1" 3 ML MISC    Sig: Use for b12 injections    Dispense:  50 each    Refill:  0    Medications Discontinued During This Encounter  Medication Reason  . cyanocobalamin (,VITAMIN B-12,) 1000 MCG/ML injection Patient has not taken in last 30 days  . cyclobenzaprine (FLEXERIL) 5 MG tablet Patient has not taken in last 30 days  . lidocaine (LIDODERM) 5 %  Patient has not taken in last 30 days  . predniSONE (DELTASONE) 10 MG tablet Patient  has not taken in last 30 days  . clonazePAM (KLONOPIN) 1 MG tablet     Follow-up: No Follow-up on file.   Crecencio Mc, MD

## 2017-09-19 NOTE — Patient Instructions (Signed)
I have dc'd the clonazepam and prescribing alprazolam for use up to twice daily  We can increase the effexor dose in a few weeks if needed .  Resume B12 injections   xrays of neck today  MRI brain and referral to Dr Manuella Ghazi in progress

## 2017-09-20 DIAGNOSIS — M542 Cervicalgia: Secondary | ICD-10-CM | POA: Insufficient documentation

## 2017-09-20 NOTE — Assessment & Plan Note (Addendum)
With neuralgia in a C3 distribution by history.  Plain films showing degenerative changes from C4 to C6 and muscle spasm

## 2017-09-20 NOTE — Assessment & Plan Note (Signed)
Recurring now daily.  Left sided,  Boring quality,  Severe at times.  MRI brain ordered

## 2017-09-20 NOTE — Assessment & Plan Note (Addendum)
With insomnia and panic attacks,  Aggravated by recent dissolution of 11 year relationship and engagement.  Resume alprazolam for panic attacks , continue Effexor 75 mg daily but will increase dose if 2-3 weeks if needed.

## 2017-09-20 NOTE — Assessment & Plan Note (Signed)
She is restarting pravastatin for a second trial to see if the myalgias recur.   Lab Results  Component Value Date   CHOL 323 (H) 03/11/2017   HDL 58.50 03/11/2017   LDLCALC 239 (H) 03/11/2017   LDLDIRECT 265.0 03/08/2016   TRIG 124.0 03/11/2017   CHOLHDL 6 03/11/2017

## 2017-09-30 ENCOUNTER — Telehealth: Payer: Self-pay | Admitting: Internal Medicine

## 2017-09-30 MED ORDER — DIAZEPAM 10 MG PO TABS
10.0000 mg | ORAL_TABLET | Freq: Once | ORAL | 0 refills | Status: DC | PRN
Start: 2017-09-30 — End: 2018-02-22

## 2017-09-30 NOTE — Telephone Encounter (Signed)
Notified the Patient, thanks

## 2017-09-30 NOTE — Telephone Encounter (Signed)
rx has been printed, signed and faxed.  

## 2017-09-30 NOTE — Telephone Encounter (Signed)
Copied from [place] [id number]. Topic: Quick Communication - See Telephone Encounter >> Sep 30, 2017 10:06 AM Bea Graff, NT wrote: CRM for notification. See Telephone encounter for:  09/30/17. Patient needing a rx for valium to be called in for her MRI on Monday. She uses the [facility] on [address] in [place]. Their number is [phone]. Please let pt know once this is called in.

## 2017-09-30 NOTE — Telephone Encounter (Signed)
Please advise, thanks.

## 2017-09-30 NOTE — Telephone Encounter (Signed)
Valium rx printed, .tell her to start with 1/2 tablet one hour prior to MRI on Monday  repeat dose in 30 minutes if needed

## 2017-09-30 NOTE — Telephone Encounter (Signed)
See note.patient is requesting for today to use on Monday with mri.

## 2017-10-03 ENCOUNTER — Ambulatory Visit
Admission: RE | Admit: 2017-10-03 | Discharge: 2017-10-03 | Disposition: A | Discharge status: Home / Self Care | Payer: BLUE CROSS/BLUE SHIELD | Source: Ambulatory Visit | Attending: Internal Medicine | Admitting: Internal Medicine

## 2017-10-03 DIAGNOSIS — G44029 Chronic cluster headache, not intractable: Secondary | ICD-10-CM | POA: Diagnosis present

## 2017-10-06 ENCOUNTER — Encounter: Payer: Self-pay | Admitting: Internal Medicine

## 2017-10-07 ENCOUNTER — Other Ambulatory Visit: Payer: Self-pay | Admitting: Internal Medicine

## 2017-10-07 MED ORDER — TIZANIDINE HCL 2 MG PO CAPS: 2.0000 mg | ORAL_CAPSULE | Freq: Three times a day (TID) | ORAL | 2 refills | Status: DC

## 2017-10-10 ENCOUNTER — Other Ambulatory Visit: Payer: Self-pay

## 2017-10-10 ENCOUNTER — Telehealth: Payer: Self-pay

## 2017-10-10 MED ORDER — TIZANIDINE HCL 2 MG PO CAPS: 2.0000 mg | ORAL_CAPSULE | Freq: Three times a day (TID) | ORAL | 2 refills | Status: DC

## 2017-10-10 NOTE — Telephone Encounter (Signed)
Received call from [place] Tizanidine was called to wrong pharmacy. I have sent to correct one and up dated chart.

## 2017-10-12 ENCOUNTER — Telehealth: Payer: Self-pay

## 2017-10-12 MED ORDER — TIZANIDINE HCL 2 MG PO TABS: 2.0000 mg | ORAL_TABLET | Freq: Four times a day (QID) | ORAL | 5 refills | Status: DC | PRN

## 2017-10-12 NOTE — Telephone Encounter (Signed)
Yes,  New rx sent for tablets

## 2017-10-12 NOTE — Telephone Encounter (Signed)
Received a fax from [facility] about pt's tizanidine. The fax stated that they do not have the capsules and it is not an interchangable medication. They are wanting to know if it is okay to just use tablets. Please advise.

## 2017-11-10 ENCOUNTER — Telehealth: Payer: Self-pay | Admitting: Internal Medicine

## 2017-11-10 NOTE — Telephone Encounter (Signed)
Pt  Had  Called  In    Agent  Made  An  Appointment     With    The  Patient      For  Next   Wednesday      With   Nichole Riddle   Because    Unable to  See  Dr  Derrel Nip     Pt  Had  Some  Swelling of  Hands    Was  Discussing  Treatment options   With  Patient  Offered  To   Try to  Get   An  Appointment   Sooner  But  Pt had   Work  Barista     We  Then lost  Connection    And  Attempted   To  Call pt  Back

## 2017-11-16 ENCOUNTER — Ambulatory Visit: Payer: BLUE CROSS/BLUE SHIELD | Admitting: Family Medicine

## 2017-11-24 ENCOUNTER — Encounter: Payer: Self-pay | Admitting: Internal Medicine

## 2017-11-24 ENCOUNTER — Ambulatory Visit (INDEPENDENT_AMBULATORY_CARE_PROVIDER_SITE_OTHER): Payer: BLUE CROSS/BLUE SHIELD

## 2017-11-24 ENCOUNTER — Other Ambulatory Visit: Payer: Self-pay | Admitting: Internal Medicine

## 2017-11-24 DIAGNOSIS — R05 Cough: Secondary | ICD-10-CM

## 2017-11-24 DIAGNOSIS — R059 Cough, unspecified: Secondary | ICD-10-CM

## 2017-11-24 MED ORDER — PREDNISONE 10 MG PO TABS: ORAL_TABLET | ORAL | 0 refills | Status: DC

## 2018-01-09 ENCOUNTER — Ambulatory Visit: Payer: BLUE CROSS/BLUE SHIELD | Admitting: Internal Medicine

## 2018-01-13 ENCOUNTER — Other Ambulatory Visit (INDEPENDENT_AMBULATORY_CARE_PROVIDER_SITE_OTHER): Payer: Self-pay

## 2018-01-13 ENCOUNTER — Ambulatory Visit (INDEPENDENT_AMBULATORY_CARE_PROVIDER_SITE_OTHER): Payer: BLUE CROSS/BLUE SHIELD

## 2018-01-13 ENCOUNTER — Ambulatory Visit: Payer: BLUE CROSS/BLUE SHIELD | Admitting: Family Medicine

## 2018-01-13 ENCOUNTER — Other Ambulatory Visit: Payer: Self-pay | Admitting: Internal Medicine

## 2018-01-13 DIAGNOSIS — M545 Low back pain, unspecified: Secondary | ICD-10-CM

## 2018-01-13 DIAGNOSIS — R109 Unspecified abdominal pain: Secondary | ICD-10-CM

## 2018-01-13 LAB — URINALYSIS, ROUTINE W REFLEX MICROSCOPIC
BILIRUBIN URINE: NEGATIVE
Hgb urine dipstick: NEGATIVE
KETONES UR: NEGATIVE
LEUKOCYTES UA: NEGATIVE
NITRITE: NEGATIVE
PH: 5.5 (ref 5.0–8.0)
SPECIFIC GRAVITY, URINE: 1.02 (ref 1.000–1.030)
Total Protein, Urine: NEGATIVE
URINE GLUCOSE: NEGATIVE
Urobilinogen, UA: 0.2 (ref 0.0–1.0)

## 2018-01-14 ENCOUNTER — Encounter: Payer: Self-pay | Admitting: Internal Medicine

## 2018-01-15 ENCOUNTER — Encounter: Payer: Self-pay | Admitting: Internal Medicine

## 2018-01-27 ENCOUNTER — Telehealth: Payer: Self-pay | Admitting: Internal Medicine

## 2018-01-27 NOTE — Telephone Encounter (Signed)
Copied from [place] [phone]. Topic: Appointment Scheduling - Scheduling Inquiry for Clinic >> Jan 27, 2018  9:19 AM Arletha Grippe wrote: Reason for CRM: pt is asking for appt with dr Derrel Nip next week. Dr Derrel Nip did xrays on her ribs, and pt is having side pain and constipation. She is asking to be worked in. Please call [phone]

## 2018-01-27 NOTE — Telephone Encounter (Signed)
Spoke with pt and she has been scheduled for 02/02/2018 at 12:00pm. Pt is aware of appt date and time.

## 2018-02-02 ENCOUNTER — Ambulatory Visit: Payer: Self-pay | Admitting: Internal Medicine

## 2018-02-09 ENCOUNTER — Telehealth: Payer: Self-pay | Admitting: *Deleted

## 2018-02-09 NOTE — Telephone Encounter (Signed)
Copied from [place]. Topic: Inquiry >> Feb 09, 2018 10:33 AM Pricilla Handler wrote: Reason for CRM: Patient has requested a medication for diarrhea. Patient wants to know if Dr. Derrel Nip could send a medication to her pharmacy without the patient coming in for a visit. Patient's diarrhea has been persistent for a week. Please call patient at [phone].        Thank You!!!

## 2018-02-09 NOTE — Telephone Encounter (Signed)
No I cannot ,  Not without knowing what is causing the diarrhea and that require an office visit and testing .  If she is not seeing blood in stools or having fevers,  She can use Imodium and switch to a clear liquid diet . Avoid artificial sweeteners.   If symptoms do not resolve , needs to be seen

## 2018-02-09 NOTE — Telephone Encounter (Signed)
Patient does not want to come into office, but ask for something to be called in for Diarrhea symptoms times one week.

## 2018-02-10 NOTE — Telephone Encounter (Signed)
Patient notified and voiced understanding today diarrhea is better.

## 2018-02-22 ENCOUNTER — Encounter: Payer: Self-pay | Admitting: Internal Medicine

## 2018-02-22 ENCOUNTER — Ambulatory Visit (INDEPENDENT_AMBULATORY_CARE_PROVIDER_SITE_OTHER): Payer: BLUE CROSS/BLUE SHIELD | Admitting: Internal Medicine

## 2018-02-22 ENCOUNTER — Other Ambulatory Visit (HOSPITAL_COMMUNITY)
Admission: RE | Admit: 2018-02-22 | Discharge: 2018-02-22 | Disposition: A | Discharge status: Home / Self Care | Payer: BLUE CROSS/BLUE SHIELD | Source: Ambulatory Visit | Attending: Internal Medicine | Admitting: Internal Medicine

## 2018-02-22 VITALS — BP 122/86 | HR 80 | Temp 98.1°F | Resp 14 | Ht 63.0 in | Wt 166.1 lb

## 2018-02-22 DIAGNOSIS — Z124 Encounter for screening for malignant neoplasm of cervix: Secondary | ICD-10-CM

## 2018-02-22 DIAGNOSIS — M25512 Pain in left shoulder: Secondary | ICD-10-CM | POA: Diagnosis not present

## 2018-02-22 DIAGNOSIS — Z0000 Encounter for general adult medical examination without abnormal findings: Secondary | ICD-10-CM | POA: Diagnosis not present

## 2018-02-22 DIAGNOSIS — M791 Myalgia, unspecified site: Secondary | ICD-10-CM

## 2018-02-22 DIAGNOSIS — M542 Cervicalgia: Secondary | ICD-10-CM

## 2018-02-22 DIAGNOSIS — E785 Hyperlipidemia, unspecified: Secondary | ICD-10-CM

## 2018-02-22 DIAGNOSIS — Z90711 Acquired absence of uterus with remaining cervical stump: Secondary | ICD-10-CM

## 2018-02-22 DIAGNOSIS — R5383 Other fatigue: Secondary | ICD-10-CM

## 2018-02-22 DIAGNOSIS — L03032 Cellulitis of left toe: Secondary | ICD-10-CM | POA: Diagnosis not present

## 2018-02-22 DIAGNOSIS — T466X5A Adverse effect of antihyperlipidemic and antiarteriosclerotic drugs, initial encounter: Secondary | ICD-10-CM | POA: Diagnosis not present

## 2018-02-22 DIAGNOSIS — E559 Vitamin D deficiency, unspecified: Secondary | ICD-10-CM

## 2018-02-22 DIAGNOSIS — E7800 Pure hypercholesterolemia, unspecified: Secondary | ICD-10-CM

## 2018-02-22 DIAGNOSIS — G8929 Other chronic pain: Secondary | ICD-10-CM

## 2018-02-22 LAB — LIPID PANEL
CHOL/HDL RATIO: 6
Cholesterol: 353 mg/dL — ABNORMAL HIGH (ref 0–200)
HDL: 58.5 mg/dL (ref >39.00)
LDL CALC: 260 mg/dL — AB (ref 0–99)
NONHDL: 294.98
TRIGLYCERIDES: 173 mg/dL — AB (ref 0.0–149.0)
VLDL: 34.6 mg/dL (ref 0.0–40.0)

## 2018-02-22 LAB — COMPREHENSIVE METABOLIC PANEL
ALT: 18 U/L (ref 0–35)
AST: 18 U/L (ref 0–37)
Albumin: 4.4 g/dL (ref 3.5–5.2)
Alkaline Phosphatase: 79 U/L (ref 39–117)
BILIRUBIN TOTAL: 0.2 mg/dL (ref 0.2–1.2)
BUN: 11 mg/dL (ref 6–23)
CALCIUM: 9.6 mg/dL (ref 8.4–10.5)
CO2: 28 meq/L (ref 19–32)
Chloride: 101 mEq/L (ref 96–112)
Creatinine, Ser: 0.79 mg/dL (ref 0.40–1.20)
GFR: 79.18 mL/min (ref >60.00)
Glucose, Bld: 106 mg/dL — ABNORMAL HIGH (ref 70–99)
Potassium: 4 mEq/L (ref 3.5–5.1)
Sodium: 138 mEq/L (ref 135–145)
Total Protein: 7.6 g/dL (ref 6.0–8.3)

## 2018-02-22 LAB — VITAMIN D 25 HYDROXY (VIT D DEFICIENCY, FRACTURES): VITD: 16.43 ng/mL — ABNORMAL LOW (ref 30.00–100.00)

## 2018-02-22 MED ORDER — CEPHALEXIN 500 MG PO TABS: 500.0000 mg | ORAL_TABLET | Freq: Four times a day (QID) | ORAL | 0 refills | Status: DC

## 2018-02-22 MED ORDER — ZOSTER VAC RECOMB ADJUVANTED 50 MCG/0.5ML IM SUSR: 0.5000 mL | Freq: Once | INTRAMUSCULAR | 1 refills | Status: AC

## 2018-02-22 NOTE — Assessment & Plan Note (Signed)
LAST PAP NORMAL DEC 2015  I

## 2018-02-22 NOTE — Progress Notes (Signed)
Patient ID: Nichole Riddle, female    DOB: [birth year]  Age: 57 y.o. MRN: [id number]  The patient is here for annual PREVENTIVE  examination and management of other chronic and acute problems.  Health maint:  DUE FOR PAP MAMMOGRAM DUE IN April  COLON 2016   The risk factors are reflected in the social history.  The roster of all physicians providing medical care to patient - is listed in the Snapshot section of the chart.  Activities of daily living:  The patient is 100% independent in all ADLs: dressing, toileting, feeding as well as independent mobility  Home safety : The patient has smoke detectors in the home. They wear seatbelts.  There are no firearms at home. There is no violence in the home.   There is no risks for hepatitis, STDs or HIV. There is no   history of blood transfusion. They have no travel history to infectious disease endemic areas of the world.  The patient has seen their dentist in the last six month. They have seen their eye doctor in the last year. They admit to slight hearing difficulty with regard to whispered voices and some television programs.  They have deferred audiologic testing in the last year.  They do not  have excessive sun exposure. Discussed the need for sun protection: hats, long sleeves and use of sunscreen if there is significant sun exposure.   Diet: the importance of a healthy diet is discussed. They do have a healthy diet.  The benefits of regular aerobic exercise were discussed. She walks 4 times per week ,  20 minutes.   Depression screen: there are no signs or vegative symptoms of depression- irritability, change in appetite, anhedonia, sadness/tearfullness.  Cognitive assessment: the patient manages all their financial and personal affairs and is actively engaged. They could relate day,date,year and events; recalled 2/3 objects at 3 minutes; performed clock-face test normally.  The following portions of the patient's history were reviewed and  updated as appropriate: allergies, current medications, past family history, past medical history,  past surgical history, past social history  and problem list.  Visual acuity was not assessed per patient preference since she has regular follow up with her ophthalmologist. Hearing and body mass index were assessed and reviewed.   During the course of the visit the patient was educated and counseled about appropriate screening and preventive services including : fall prevention , diabetes screening, nutrition counseling, colorectal cancer screening, and recommended immunizations.    CC: The primary encounter diagnosis was Vitamin D deficiency. Diagnoses of H/O abdominal supracervical subtotal hysterectomy, Pure hypercholesterolemia, Fatigue, unspecified type, Cervical cancer screening, Visit for preventive health examination, Cervicalgia of occipito-atlanto-axial region, Chronic paronychia of toe, left, Hyperlipidemia with target LDL less than 130, Myalgia due to statin, and Chronic left shoulder pain were also pertinent to this visit.  LEFT SHOULDER PAIN SINCE a fall  IN MID February , HAVING NECK PAIN AND LEFT SHOULDER PAIN that has actually  gotten worse.    RIGHT FOOT PEELS ON THE PLANTAR SURFACE ONLY ON THE FOREFOOT UNDER THE TOES .USES VASELINE AS A MOISTURIZER.  DOES NOT ITCH   RECURRENT PARONYCHIA,  SKIN PEELING on sides of toes    History Kamree has a past medical history of Anxiety (1997), Bulimia (1997), Depression (1997), Gastric ulcer (2012), Gastric ulcer due to Helicobacter pylori (9485), History of cardiac catheterization (2012), HLD (hyperlipidemia), IBS (irritable bowel syndrome) (06/17/2015), Irritable bowel, Migraines, and Multiple gastric ulcers.   She has a past  surgical history that includes Abdominal hysterectomy; Esophagogastroduodenoscopy; and Colonoscopy.   Her family history includes Celiac disease in her brother; Esophageal cancer in her maternal grandmother; Heart  disease (age of onset: 76) in her brother; Heart disease (age of onset: 84) in her father; Irritable bowel syndrome in her mother; Stomach cancer in her maternal grandmother; Ulcerative colitis in her mother.She reports that she has never smoked. She has never used smokeless tobacco. She reports that she drinks about 1.2 oz of alcohol per week. She reports that she does not use drugs.  Outpatient Medications Prior to Visit  Medication Sig Dispense Refill  . ALPRAZolam (XANAX) 0.5 MG tablet Take 1 tablet (0.5 mg total) by mouth 2 (two) times daily as needed for anxiety. TAKE 1 TABLET BY MOUTH EVERY NIGHT AT BEDTIME AS NEEDED FOR ANXIETY 60 tablet 5  . cyanocobalamin (,VITAMIN B-12,) 1000 MCG/ML injection Inject 1 mL (1,000 mcg total) into the muscle once a week. 10 mL 2  . promethazine (PHENERGAN) 25 MG tablet Take 1 tablet (25 mg total) by mouth every 8 (eight) hours as needed for nausea or vomiting. 30 tablet 0  . Syringe/Needle, Disp, (SYRINGE 3CC/25GX1") 25G X 1" 3 ML MISC Use for b12 injections 50 each 0  . Syringe/Needle, Disp, (SYRINGE 3CC/25GX1") 25G X 1" 3 ML MISC Use for b12 injections 50 each 0  . tiZANidine (ZANAFLEX) 2 MG tablet Take 1 tablet (2 mg total) every 6 (six) hours as needed by mouth for muscle spasms. 90 tablet 5  . trimethoprim-polymyxin b (POLYTRIM) ophthalmic solution   0  . venlafaxine XR (EFFEXOR-XR) 37.5 MG 24 hr capsule TAKE 1 CAPSULE EVERY DAY WITH BREAKFAST INCREASE TO 2 CAPS(75MG ) AFTER2 WEEKS 60 capsule 3  . zolmitriptan (ZOMIG-ZMT) 5 MG disintegrating tablet Take by mouth.    . diazepam (VALIUM) 10 MG tablet Take 1 tablet (10 mg total) by mouth once as needed for anxiety. May repeat in 30 minutes if needed (for MRI) (Patient not taking: Reported on 02/22/2018) 2 tablet 0  . ergocalciferol (DRISDOL) 50000 units capsule Take 1 capsule (50,000 Units total) by mouth once a week. (Patient not taking: Reported on 02/22/2018) 4 capsule 2  . estradiol (ESTRACE VAGINAL) 0.1  MG/GM vaginal cream Place 1 Applicatorful vaginally at bedtime. FOR 2 WEEKS,  Then reduce use to twice weekly (Patient not taking: Reported on 02/22/2018) 42.5 g 12  . pravastatin (PRAVACHOL) 20 MG tablet Take 1 tablet (20 mg total) by mouth daily. (Patient not taking: Reported on 02/22/2018) 90 tablet 3  . predniSONE (DELTASONE) 10 MG tablet 6 tablets on Day 1 , then reduce by 1 tablet daily until gone (Patient not taking: Reported on 02/22/2018) 21 tablet 0  . rizatriptan (MAXALT-MLT) 10 MG disintegrating tablet DISSOLVE 1 TABLET ON THE TONGUE AS NEEDED FOR MIGRAINE, MAY REPEAT IN 2 HOURS IF NEEDED (Patient not taking: Reported on 02/22/2018) 10 tablet 5   No facility-administered medications prior to visit.     Review of Systems   Patient denies headache, fevers, malaise, unintentional weight loss, skin rash, eye pain, sinus congestion and sinus pain, sore throat, dysphagia,  hemoptysis , cough, dyspnea, wheezing, chest pain, palpitations, orthopnea, edema, abdominal pain, nausea, melena, diarrhea, constipation, flank pain, dysuria, hematuria, urinary  Frequency, nocturia, numbness, tingling, seizures,  Focal weakness, Loss of consciousness,  Tremor, insomnia, depression, anxiety, and suicidal ideation.      Objective:  BP 122/86 (BP Location: Left Arm, Patient Position: Sitting, Cuff Size: Normal)   Pulse 80  Temp 98.1 F (36.7 C) (Oral)   Resp 14   Ht 5\' 3"  (1.6 m)   Wt 166 lb 1.9 oz (75.4 kg)   SpO2 98%   BMI 29.43 kg/m   Physical Exam   General Appearance:    Alert, cooperative, no distress, appears stated age  Head:    Normocephalic, without obvious abnormality, atraumatic  Eyes:    PERRL, conjunctiva/corneas clear, EOM's intact, fundi    benign, both eyes  Ears:    Normal TM's and external ear canals, both ears  Nose:   Nares normal, septum midline, mucosa normal, no drainage    or sinus tenderness  Throat:   Lips, mucosa, and tongue normal; teeth and gums normal  Neck:    Supple, symmetrical, trachea midline, no adenopathy;    thyroid:  no enlargement/tenderness/nodules; no carotid   bruit or JVD  Back:     Symmetric, no curvature, ROM normal, no CVA tenderness  Lungs:     Clear to auscultation bilaterally, respirations unlabored  Chest Wall:    No tenderness or deformity   Heart:    Regular rate and rhythm, S1 and S2 normal, no murmur, rub   or gallop  Breast Exam:    No tenderness, masses, or nipple abnormality  Abdomen:     Soft, non-tender, bowel sounds active all four quadrants,    no masses, no organomegaly  Genitalia:    Pelvic: cervix normal in appearance, external genitalia normal, no adnexal masses or tenderness, no cervical motion tenderness, rectovaginal septum normal, uterus normal size, shape, and consistency and vagina normal without discharge  Extremities:   Extremities normal, atraumatic, no cyanosis or edema.   Pulses:   2+ and symmetric all extremities  Skin:   Palmar surface of left foot with strips of skin denuded , no lesions or papules . Skin color, texture, turgor normal, no rashes or lesions  Lymph nodes:   Cervical, supraclavicular, and axillary nodes normal  Neurologic:   CNII-XII intact, normal strength, sensation and reflexes    throughout     MSK:  Left shoulder full ROM but has pain with abduction > 180 over scapula and AC joint     Assessment & Plan:   Problem List Items Addressed This Visit    H/O abdominal supracervical subtotal hysterectomy    LAST PAP NORMAL DEC 2015  I      Vitamin D deficiency - Primary    Drisdol prescribed for 3 more months.       Relevant Orders   VITAMIN D 25 Hydroxy (Vit-D Deficiency, Fractures) (Completed)   Visit for preventive health examination    Annual comprehensive preventive exam was done as well as an evaluation and management of chronic conditions .  During the course of the visit the patient was educated and counseled about appropriate screening and preventive services including  :  diabetes screening, lipid analysis with projected  10 year  risk for CAD , nutrition counseling, breast, cervical and colorectal cancer screening, and recommended immunizations.  Printed recommendations for health maintenance screenings was given      Myalgia due to statin    She has tried to tolerate pravastatin on 2 attempts.  She has no risk factors for CAD.  Will consider trial of zetia   Lab Results  Component Value Date   CHOL 353 (H) 02/22/2018   HDL 58.50 02/22/2018   LDLCALC 260 (H) 02/22/2018   LDLDIRECT 265.0 03/08/2016   TRIG 173.0 (H) 02/22/2018  CHOLHDL 6 02/22/2018        Hyperlipidemia with target LDL less than 130    She was intolerant of fravastatin for a second trime due to myalgias   Lab Results  Component Value Date   CHOL 353 (H) 02/22/2018   HDL 58.50 02/22/2018   LDLCALC 260 (H) 02/22/2018   LDLDIRECT 265.0 03/08/2016   TRIG 173.0 (H) 02/22/2018   CHOLHDL 6 02/22/2018         Chronic paronychia of toe, left    Involving multiple toes.  Cephalexin prescribed.       Relevant Medications   Cephalexin 500 MG tablet   Chronic left shoulder pain    Her ROM is limited due to pain,  Which started after a fall in February . Sports medicine referral      Relevant Orders   Ambulatory referral to Sports Medicine   Cervicalgia of occipito-atlanto-axial region    She continues to have neck pain with evidence of DDD in the C3 area by palin films        Other Visit Diagnoses    Pure hypercholesterolemia       Relevant Orders   Lipid panel (Completed)   Fatigue, unspecified type       Relevant Orders   Comprehensive metabolic panel (Completed)   Cervical cancer screening       Relevant Orders   Cytology - PAP      I have discontinued Wonder Alonzo's pravastatin, ergocalciferol, estradiol, rizatriptan, diazepam, and predniSONE. I am also having her start on Zoster Vaccine Adjuvanted, Cephalexin, and ergocalciferol. Additionally, I am having her  maintain her promethazine, SYRINGE 3CC/25GX1", venlafaxine XR, ALPRAZolam, cyanocobalamin, SYRINGE 3CC/25GX1", tiZANidine, zolmitriptan, and trimethoprim-polymyxin b.  Meds ordered this encounter  Medications  . Zoster Vaccine Adjuvanted [facility]) injection    Sig: Inject 0.5 mLs into the muscle once for 1 dose.    Dispense:  1 each    Refill:  1  . Cephalexin 500 MG tablet    Sig: Take 1 tablet (500 mg total) by mouth 4 (four) times daily.    Dispense:  28 tablet    Refill:  0  . ergocalciferol (DRISDOL) 50000 units capsule    Sig: Take 1 capsule (50,000 Units total) by mouth once a week.    Dispense:  4 capsule    Refill:  2    Medications Discontinued During This Encounter  Medication Reason  . diazepam (VALIUM) 10 MG tablet Patient has not taken in last 30 days  . ergocalciferol (DRISDOL) 50000 units capsule Completed Course  . estradiol (ESTRACE VAGINAL) 0.1 MG/GM vaginal cream Patient has not taken in last 30 days  . pravastatin (PRAVACHOL) 20 MG tablet Patient has not taken in last 30 days  . predniSONE (DELTASONE) 10 MG tablet Completed Course  . rizatriptan (MAXALT-MLT) 10 MG disintegrating tablet Patient has not taken in last 30 days    Follow-up: No follow-ups on file.   Crecencio Mc, MD

## 2018-02-22 NOTE — Patient Instructions (Addendum)
The ShingRx vaccine is now available in local pharmacies and is much more protective thant Zostavaxs,  It is therefore ADVISED for all interested adults over 50 to prevent shingles   TRY USING SHEA Butter on the soles of your foot   The redness around the cuticles may be paronychia.  This is  A bacterial infection that is treated with cephalexin , which I will give you to use prn    Health Maintenance for Postmenopausal Women Menopause is a normal process in which your reproductive ability comes to an end. This process happens gradually over a span of months to years, usually between the ages of 19 and 61. Menopause is complete when you have missed 12 consecutive menstrual periods. It is important to talk with your health care provider about some of the most common conditions that affect postmenopausal women, such as heart disease, cancer, and bone loss (osteoporosis). Adopting a healthy lifestyle and getting preventive care can help to promote your health and wellness. Those actions can also lower your chances of developing some of these common conditions. What should I know about menopause? During menopause, you may experience a number of symptoms, such as:  Moderate-to-severe hot flashes.  Night sweats.  Decrease in sex drive.  Mood swings.  Headaches.  Tiredness.  Irritability.  Memory problems.  Insomnia.  Choosing to treat or not to treat menopausal changes is an individual decision that you make with your health care provider. What should I know about hormone replacement therapy and supplements? Hormone therapy products are effective for treating symptoms that are associated with menopause, such as hot flashes and night sweats. Hormone replacement carries certain risks, especially as you become older. If you are thinking about using estrogen or estrogen with progestin treatments, discuss the benefits and risks with your health care provider. What should I know about heart  disease and stroke? Heart disease, heart attack, and stroke become more likely as you age. This may be due, in part, to the hormonal changes that your body experiences during menopause. These can affect how your body processes dietary fats, triglycerides, and cholesterol. Heart attack and stroke are both medical emergencies. There are many things that you can do to help prevent heart disease and stroke:  Have your blood pressure checked at least every 1-2 years. High blood pressure causes heart disease and increases the risk of stroke.  If you are 39-39 years old, ask your health care provider if you should take aspirin to prevent a heart attack or a stroke.  Do not use any tobacco products, including cigarettes, chewing tobacco, or electronic cigarettes. If you need help quitting, ask your health care provider.  It is important to eat a healthy diet and maintain a healthy weight. ? Be sure to include plenty of vegetables, fruits, low-fat dairy products, and lean protein. ? Avoid eating foods that are high in solid fats, added sugars, or salt (sodium).  Get regular exercise. This is one of the most important things that you can do for your health. ? Try to exercise for at least 150 minutes each week. The type of exercise that you do should increase your heart rate and make you sweat. This is known as moderate-intensity exercise. ? Try to do strengthening exercises at least twice each week. Do these in addition to the moderate-intensity exercise.  Know your numbers.Ask your health care provider to check your cholesterol and your blood glucose. Continue to have your blood tested as directed by your health care  provider.  What should I know about cancer screening? There are several types of cancer. Take the following steps to reduce your risk and to catch any cancer development as early as possible. Breast Cancer  Practice breast self-awareness. ? This means understanding how your breasts  normally appear and feel. ? It also means doing regular breast self-exams. Let your health care provider know about any changes, no matter how small.  If you are 29 or older, have a clinician do a breast exam (clinical breast exam or CBE) every year. Depending on your age, family history, and medical history, it may be recommended that you also have a yearly breast X-ray (mammogram).  If you have a family history of breast cancer, talk with your health care provider about genetic screening.  If you are at high risk for breast cancer, talk with your health care provider about having an MRI and a mammogram every year.  Breast cancer (BRCA) gene test is recommended for women who have family members with BRCA-related cancers. Results of the assessment will determine the need for genetic counseling and BRCA1 and for BRCA2 testing. BRCA-related cancers include these types: ? Breast. This occurs in males or females. ? Ovarian. ? Tubal. This may also be called fallopian tube cancer. ? Cancer of the abdominal or pelvic lining (peritoneal cancer). ? Prostate. ? Pancreatic.  Cervical, Uterine, and Ovarian Cancer Your health care provider may recommend that you be screened regularly for cancer of the pelvic organs. These include your ovaries, uterus, and vagina. This screening involves a pelvic exam, which includes checking for microscopic changes to the surface of your cervix (Pap test).  For women ages 21-65, health care providers may recommend a pelvic exam and a Pap test every three years. For women ages 7-65, they may recommend the Pap test and pelvic exam, combined with testing for human papilloma virus (HPV), every five years. Some types of HPV increase your risk of cervical cancer. Testing for HPV may also be done on women of any age who have unclear Pap test results.  Other health care providers may not recommend any screening for nonpregnant women who are considered low risk for pelvic cancer  and have no symptoms. Ask your health care provider if a screening pelvic exam is right for you.  If you have had past treatment for cervical cancer or a condition that could lead to cancer, you need Pap tests and screening for cancer for at least 20 years after your treatment. If Pap tests have been discontinued for you, your risk factors (such as having a new sexual partner) need to be reassessed to determine if you should start having screenings again. Some women have medical problems that increase the chance of getting cervical cancer. In these cases, your health care provider may recommend that you have screening and Pap tests more often.  If you have a family history of uterine cancer or ovarian cancer, talk with your health care provider about genetic screening.  If you have vaginal bleeding after reaching menopause, tell your health care provider.  There are currently no reliable tests available to screen for ovarian cancer.  Lung Cancer Lung cancer screening is recommended for adults 55-32 years old who are at high risk for lung cancer because of a history of smoking. A yearly low-dose CT scan of the lungs is recommended if you:  Currently smoke.  Have a history of at least 30 pack-years of smoking and you currently smoke or have quit within  the past 15 years. A pack-year is smoking an average of one pack of cigarettes per day for one year.  Yearly screening should:  Continue until it has been 15 years since you quit.  Stop if you develop a health problem that would prevent you from having lung cancer treatment.  Colorectal Cancer  This type of cancer can be detected and can often be prevented.  Routine colorectal cancer screening usually begins at age 71 and continues through age 76.  If you have risk factors for colon cancer, your health care provider may recommend that you be screened at an earlier age.  If you have a family history of colorectal cancer, talk with your  health care provider about genetic screening.  Your health care provider may also recommend using home test kits to check for hidden blood in your stool.  A small camera at the end of a tube can be used to examine your colon directly (sigmoidoscopy or colonoscopy). This is done to check for the earliest forms of colorectal cancer.  Direct examination of the colon should be repeated every 5-10 years until age 36. However, if early forms of precancerous polyps or small growths are found or if you have a family history or genetic risk for colorectal cancer, you may need to be screened more often.  Skin Cancer  Check your skin from head to toe regularly.  Monitor any moles. Be sure to tell your health care provider: ? About any new moles or changes in moles, especially if there is a change in a mole's shape or color. ? If you have a mole that is larger than the size of a pencil eraser.  If any of your family members has a history of skin cancer, especially at a young age, talk with your health care provider about genetic screening.  Always use sunscreen. Apply sunscreen liberally and repeatedly throughout the day.  Whenever you are outside, protect yourself by wearing long sleeves, pants, a wide-brimmed hat, and sunglasses.  What should I know about osteoporosis? Osteoporosis is a condition in which bone destruction happens more quickly than new bone creation. After menopause, you may be at an increased risk for osteoporosis. To help prevent osteoporosis or the bone fractures that can happen because of osteoporosis, the following is recommended:  If you are 36-23 years old, get at least 1,000 mg of calcium and at least 600 mg of vitamin D per day.  If you are older than age 23 but younger than age 61, get at least 1,200 mg of calcium and at least 600 mg of vitamin D per day.  If you are older than age 3, get at least 1,200 mg of calcium and at least 800 mg of vitamin D per day.  Smoking  and excessive alcohol intake increase the risk of osteoporosis. Eat foods that are rich in calcium and vitamin D, and do weight-bearing exercises several times each week as directed by your health care provider. What should I know about how menopause affects my mental health? Depression may occur at any age, but it is more common as you become older. Common symptoms of depression include:  Low or sad mood.  Changes in sleep patterns.  Changes in appetite or eating patterns.  Feeling an overall lack of motivation or enjoyment of activities that you previously enjoyed.  Frequent crying spells.  Talk with your health care provider if you think that you are experiencing depression. What should I know about immunizations? It  is important that you get and maintain your immunizations. These include:  Tetanus, diphtheria, and pertussis (Tdap) booster vaccine.  Influenza every year before the flu season begins.  Pneumonia vaccine.  Shingles vaccine.  Your health care provider may also recommend other immunizations. This information is not intended to replace advice given to you by your health care provider. Make sure you discuss any questions you have with your health care provider. Document Released: 01/07/2006 Document Revised: 06/04/2016 Document Reviewed: 08/19/2015 Elsevier Interactive Patient Education  2018 [employer].

## 2018-02-23 DIAGNOSIS — L03032 Cellulitis of left toe: Secondary | ICD-10-CM | POA: Insufficient documentation

## 2018-02-23 DIAGNOSIS — E559 Vitamin D deficiency, unspecified: Secondary | ICD-10-CM | POA: Insufficient documentation

## 2018-02-23 DIAGNOSIS — M25512 Pain in left shoulder: Secondary | ICD-10-CM

## 2018-02-23 DIAGNOSIS — M791 Myalgia, unspecified site: Secondary | ICD-10-CM | POA: Insufficient documentation

## 2018-02-23 DIAGNOSIS — G8929 Other chronic pain: Secondary | ICD-10-CM | POA: Insufficient documentation

## 2018-02-23 DIAGNOSIS — T466X5A Adverse effect of antihyperlipidemic and antiarteriosclerotic drugs, initial encounter: Secondary | ICD-10-CM | POA: Insufficient documentation

## 2018-02-23 MED ORDER — ERGOCALCIFEROL 1.25 MG (50000 UT) PO CAPS: 50000.0000 [IU] | ORAL_CAPSULE | ORAL | 2 refills | Status: DC

## 2018-02-23 NOTE — Assessment & Plan Note (Signed)
Annual comprehensive preventive exam was done as well as an evaluation and management of chronic conditions .  During the course of the visit the patient was educated and counseled about appropriate screening and preventive services including :  diabetes screening, lipid analysis with projected  10 year  risk for CAD , nutrition counseling, breast, cervical and colorectal cancer screening, and recommended immunizations.  Printed recommendations for health maintenance screenings was given 

## 2018-02-23 NOTE — Assessment & Plan Note (Signed)
She was intolerant of fravastatin for a second trime due to myalgias   Lab Results  Component Value Date   CHOL 353 (H) 02/22/2018   HDL 58.50 02/22/2018   LDLCALC 260 (H) 02/22/2018   LDLDIRECT 265.0 03/08/2016   TRIG 173.0 (H) 02/22/2018   CHOLHDL 6 02/22/2018

## 2018-02-23 NOTE — Assessment & Plan Note (Signed)
Drisdol prescribed for 3 more months.

## 2018-02-23 NOTE — Assessment & Plan Note (Signed)
She has tried to tolerate pravastatin on 2 attempts.  She has no risk factors for CAD.  Will consider trial of zetia   Lab Results  Component Value Date   CHOL 353 (H) 02/22/2018   HDL 58.50 02/22/2018   LDLCALC 260 (H) 02/22/2018   LDLDIRECT 265.0 03/08/2016   TRIG 173.0 (H) 02/22/2018   CHOLHDL 6 02/22/2018

## 2018-02-23 NOTE — Assessment & Plan Note (Signed)
She continues to have neck pain with evidence of DDD in the C3 area by palin films

## 2018-02-23 NOTE — Assessment & Plan Note (Signed)
Involving multiple toes.  Cephalexin prescribed.

## 2018-02-23 NOTE — Assessment & Plan Note (Addendum)
Her ROM is limited due to pain,  Which started after a fall in February . Sports medicine referral

## 2018-02-24 ENCOUNTER — Telehealth: Payer: Self-pay | Admitting: Internal Medicine

## 2018-02-24 LAB — CYTOLOGY - PAP
Diagnosis: NEGATIVE
HPV: NOT DETECTED

## 2018-02-24 MED ORDER — PREDNISONE 10 MG PO TABS: ORAL_TABLET | ORAL | 0 refills | Status: DC

## 2018-02-24 NOTE — Telephone Encounter (Signed)
Please advise 

## 2018-02-24 NOTE — Telephone Encounter (Signed)
I will Nichole Riddle to see if she can be seen earlier by any one else  In the meantime I can call in a prednisone taper to help with the inflammation     [name] ,  The patient is having increased left shoulder pain ,  Her sports medicine appt is not until April 18,  Can she put on a wait list for a more urgent appointment ?

## 2018-02-24 NOTE — Telephone Encounter (Signed)
Copied from [place] [id number]. Topic: Quick Communication - See Telephone Encounter >> Feb 24, 2018  3:12 PM Conception Chancy, NT wrote: CRM for notification. See Telephone encounter for: 02/24/18.  Patient is calling and states when she was seen for her physical she informed Dr. Derrel Nip that her left shoulder was hurting her. She states the pain has become worse and states that she is having to take 3 tylenol every 3 hours and it is not working anymore. She states is there anything that Dr. Derrel Nip can call her in to get her to her appt with the sports medicine doctor on 03/16/18. Please advise.  [facility] - [place], [place] - [address]  [place] [address]  Phone: [phone] Fax: [phone]

## 2018-02-24 NOTE — Telephone Encounter (Signed)
Patient aware about prednisone and referral

## 2018-02-27 ENCOUNTER — Other Ambulatory Visit: Payer: Self-pay | Admitting: Internal Medicine

## 2018-02-27 ENCOUNTER — Encounter: Payer: Self-pay | Admitting: Internal Medicine

## 2018-02-28 ENCOUNTER — Ambulatory Visit (INDEPENDENT_AMBULATORY_CARE_PROVIDER_SITE_OTHER): Payer: BLUE CROSS/BLUE SHIELD

## 2018-02-28 DIAGNOSIS — Z111 Encounter for screening for respiratory tuberculosis: Secondary | ICD-10-CM

## 2018-02-28 MED ORDER — SIMVASTATIN 40 MG PO TABS
40.0000 mg | ORAL_TABLET | Freq: Every evening | ORAL | 2 refills | Status: DC
Start: 2018-02-28 — End: 2018-03-09

## 2018-02-28 MED ORDER — EZETIMIBE 10 MG PO TABS: 10.0000 mg | ORAL_TABLET | Freq: Every day | ORAL | 5 refills | Status: AC

## 2018-02-28 NOTE — Progress Notes (Signed)
PPD placed in right forearm. Patient tolerated well notified to return in 48-72 hours.

## 2018-02-28 NOTE — Telephone Encounter (Addendum)
Patient came in for nurse visit and she states she is unable to tolerate simvastatin due to myalgias.   Looks like last office visit on 02/22/18 you were going to call in [place] .

## 2018-02-28 NOTE — Telephone Encounter (Signed)
Generic Zetia has been sent.

## 2018-03-02 ENCOUNTER — Ambulatory Visit: Payer: BLUE CROSS/BLUE SHIELD | Admitting: *Deleted

## 2018-03-02 DIAGNOSIS — Z111 Encounter for screening for respiratory tuberculosis: Secondary | ICD-10-CM

## 2018-03-02 LAB — TB SKIN TEST
Induration: 0 mm
TB Skin Test: NEGATIVE

## 2018-03-02 NOTE — Progress Notes (Signed)
Came into office for PPD read, copy given to patient.

## 2018-03-07 ENCOUNTER — Encounter: Payer: Self-pay | Admitting: Internal Medicine

## 2018-03-16 ENCOUNTER — Ambulatory Visit: Payer: BLUE CROSS/BLUE SHIELD | Admitting: Family Medicine

## 2018-03-28 ENCOUNTER — Ambulatory Visit: Payer: BLUE CROSS/BLUE SHIELD | Attending: Neurology | Admitting: Physical Therapy

## 2018-03-30 ENCOUNTER — Ambulatory Visit: Payer: BLUE CROSS/BLUE SHIELD | Admitting: Physical Therapy

## 2018-04-03 ENCOUNTER — Other Ambulatory Visit: Payer: Self-pay | Admitting: Internal Medicine

## 2018-04-03 ENCOUNTER — Encounter: Payer: Self-pay | Admitting: Internal Medicine

## 2018-04-03 MED ORDER — PREDNISONE 10 MG PO TABS: ORAL_TABLET | ORAL | 0 refills | Status: DC

## 2018-04-03 MED ORDER — ALBUTEROL SULFATE HFA 108 (90 BASE) MCG/ACT IN AERS: 2.0000 | INHALATION_SPRAY | Freq: Four times a day (QID) | RESPIRATORY_TRACT | 2 refills | Status: AC | PRN

## 2018-04-04 ENCOUNTER — Ambulatory Visit: Payer: Self-pay | Admitting: *Deleted

## 2018-04-04 ENCOUNTER — Encounter: Payer: BLUE CROSS/BLUE SHIELD | Admitting: Physical Therapy

## 2018-04-04 NOTE — Telephone Encounter (Signed)
Called in c/o being short of breath with bronchitis.   Any activity makes me cough and have shortness of breath.   Was seen at the urgent care last Tuesday and given an antibiotic and cough medicine but I'm not getting any better.    See triage notes.  There were no providers available at [facility] as everyone was booked all week.     She is going to go back to the urgent care.   I let her know she needed to be seen within 4 hours per the protocol however she does not have anyone to sit with this pt this evening.  Being she only has to sit with the pt she feels she will be ok until this evening.  She is a [employer] and has a job today from 10:30-2:30 that she has to do but it only requires she sit with the pt.     She is going to the urgent care center as soon as she gets off of work at 2:30.  I instructed her to go to the closest ED/urgent care center if her shortness of breath becomes worse and/or her albuterol inhaler does not help.   She verbalized understanding and assured me she would go to the urgent care this evening after work.   Reason for Disposition . [1] MILD difficulty breathing (e.g., minimal/no SOB at rest, SOB with walking, pulse <100) AND [2] NEW-onset or WORSE than normal  Answer Assessment - Initial Assessment Questions 1. RESPIRATORY STATUS: "Describe your breathing?" (e.g., wheezing, shortness of breath, unable to speak, severe coughing)      Sitting still breathing fine but any activity makes me short of breath. 2. ONSET: "When did this breathing problem begin?"      Started Saturday with a fever and didn't feel well.   Went to urgent care last Tuesday.  Got Doxycycline and cough medicine.    3. PATTERN "Does the difficult breathing come and go, or has it been constant since it started?"      Activity makes it worse.   4. SEVERITY: "How bad is your breathing?" (e.g., mild, moderate, severe)    - MILD: No SOB at rest, mild SOB with walking, speaks normally in sentences,  can lay down, no retractions, pulse < 100.    - MODERATE: SOB at rest, SOB with minimal exertion and prefers to sit, cannot lie down flat, speaks in phrases, mild retractions, audible wheezing, pulse 100-120.    - SEVERE: Very SOB at rest, speaks in single words, struggling to breathe, sitting hunched forward, retractions, pulse > 120      Short of breath with minimal activity.   I'm had lightheaded spells. 5. RECURRENT SYMPTOM: "Have you had difficulty breathing before?" If so, ask: "When was the last time?" and "What happened that time?"      Had bronchitis.    They think I had the flu even though the test was negative.    I had a tick on me 7 days ago was reason for the Doxycyline.   I feel like crap. 6. CARDIAC HISTORY: "Do you have any history of heart disease?" (e.g., heart attack, angina, bypass surgery, angioplasty)      No history 7. LUNG HISTORY: "Do you have any history of lung disease?"  (e.g., pulmonary embolus, asthma, emphysema)     No lung history.    8. CAUSE: "What do you think is causing the breathing problem?"      Bronchitis 9. OTHER SYMPTOMS: "  Do you have any other symptoms? (e.g., dizziness, runny nose, cough, chest pain, fever)     Light headed at times.   This time I feel different than bronchitis spells in the past. 10. PREGNANCY: "Is there any chance you are pregnant?" "When was your last menstrual period?"       No 11. 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

## 2018-04-15 ENCOUNTER — Other Ambulatory Visit: Payer: Self-pay | Admitting: Internal Medicine

## 2018-04-17 ENCOUNTER — Encounter: Payer: Self-pay | Admitting: Internal Medicine

## 2018-04-18 ENCOUNTER — Other Ambulatory Visit: Payer: Self-pay | Admitting: Internal Medicine

## 2018-04-18 NOTE — Telephone Encounter (Unsigned)
Copied from [place] [id number]. Topic: Quick Communication - Rx Refill/Question >> Apr 18, 2018 12:43 PM Yvette Rack wrote: Medication: ALPRAZolam Duanne Moron) 0.5 MG tablet  Preferred Pharmacy (with phone number or street name): [place], [place] - [place] [phone] (Phone) [phone] (Fax)   Agent: Please be advised that RX refills may take up to 3 business days. We ask that you follow-up with your pharmacy.

## 2018-04-18 NOTE — Telephone Encounter (Signed)
Refill request for Xanax, last filled on 09/19/17 #60 with 5 refills.   LOV: 02/22/18 Dr. Derrel Nip  [facility]

## 2018-04-19 ENCOUNTER — Emergency Department
Admission: EM | Admit: 2018-04-19 | Discharge: 2018-04-19 | Disposition: A | Discharge status: Home / Self Care | Payer: BLUE CROSS/BLUE SHIELD | Attending: Emergency Medicine | Admitting: Emergency Medicine

## 2018-04-19 ENCOUNTER — Other Ambulatory Visit: Payer: Self-pay

## 2018-04-19 ENCOUNTER — Emergency Department: Payer: BLUE CROSS/BLUE SHIELD

## 2018-04-19 ENCOUNTER — Telehealth: Payer: Self-pay

## 2018-04-19 ENCOUNTER — Encounter: Payer: Self-pay | Admitting: Emergency Medicine

## 2018-04-19 DIAGNOSIS — R079 Chest pain, unspecified: Secondary | ICD-10-CM

## 2018-04-19 DIAGNOSIS — F419 Anxiety disorder, unspecified: Secondary | ICD-10-CM | POA: Insufficient documentation

## 2018-04-19 DIAGNOSIS — F329 Major depressive disorder, single episode, unspecified: Secondary | ICD-10-CM | POA: Insufficient documentation

## 2018-04-19 DIAGNOSIS — K209 Esophagitis, unspecified without bleeding: Secondary | ICD-10-CM

## 2018-04-19 DIAGNOSIS — Z79899 Other long term (current) drug therapy: Secondary | ICD-10-CM | POA: Diagnosis not present

## 2018-04-19 LAB — CBC
HCT: 37.9 % (ref 35.0–47.0)
HEMOGLOBIN: 12.9 g/dL (ref 12.0–16.0)
MCH: 28.8 pg (ref 26.0–34.0)
MCHC: 34.1 g/dL (ref 32.0–36.0)
MCV: 84.4 fL (ref 80.0–100.0)
Platelets: 280 10*3/uL (ref 150–440)
RBC: 4.49 MIL/uL (ref 3.80–5.20)
RDW: 14.5 % (ref 11.5–14.5)
WBC: 5.1 10*3/uL (ref 3.6–11.0)

## 2018-04-19 LAB — BASIC METABOLIC PANEL
ANION GAP: 8 (ref 5–15)
BUN: 7 mg/dL (ref 6–20)
CHLORIDE: 110 mmol/L (ref 101–111)
CO2: 24 mmol/L (ref 22–32)
CREATININE: 0.86 mg/dL (ref 0.44–1.00)
Calcium: 9.2 mg/dL (ref 8.9–10.3)
GFR calc non Af Amer: >60 mL/min (ref >60)
GLUCOSE: 103 mg/dL — AB (ref 65–99)
Potassium: 3.9 mmol/L (ref 3.5–5.1)
Sodium: 142 mmol/L (ref 135–145)

## 2018-04-19 LAB — TROPONIN I: Troponin I: <0.03 ng/mL (ref <0.03)

## 2018-04-19 MED ORDER — LIDOCAINE VISCOUS HCL 2 % MT SOLN
15.0000 mL | Freq: Once | OROMUCOSAL | Status: AC
  Administered 2018-04-19: 15 mL via OROMUCOSAL
  Filled 2018-04-19: qty 15

## 2018-04-19 MED ORDER — FAMOTIDINE 40 MG PO TABS: 40.0000 mg | ORAL_TABLET | Freq: Every evening | ORAL | 1 refills | Status: AC

## 2018-04-19 MED ORDER — SUCRALFATE 1 G PO TABS: 1.0000 g | ORAL_TABLET | Freq: Four times a day (QID) | ORAL | 0 refills | Status: DC

## 2018-04-19 MED ORDER — ALPRAZOLAM 0.5 MG PO TABS: 0.5000 mg | ORAL_TABLET | Freq: Two times a day (BID) | ORAL | 5 refills | Status: DC | PRN

## 2018-04-19 NOTE — Telephone Encounter (Signed)
Please advise 

## 2018-04-19 NOTE — Telephone Encounter (Signed)
Pt came in and was very rude demanding a refill on her Xanax stating that she had been waiting way to long for this rx and that it is normally done in a couple hours. Pt then sat on my desk and stated that she wasn't moving till she received the rx. I asked her to please go sit in the lobby and I will walk back there to see what was going on.. Pt was talking rudely about our office to the other lady she was with saying that we can't do anything right and we should be paying for her ER visit.[name]  Pt  Also stated to the lady she was with that she had been out for four days and waited because it didn't take [place] long to fill it. Also tried to explain to her our rx policy and she didn't want to hear it

## 2018-04-19 NOTE — Telephone Encounter (Signed)
Need dosage clarification. [facility], call back [phone], patient is waiting now.

## 2018-04-19 NOTE — ED Provider Notes (Signed)
[facility] Emergency Department Provider Note   ____________________________________________   I have reviewed the triage vital signs and the nursing notes.   HISTORY  Chief Complaint Chest Pain   History limited by: Not Limited   HPI Nichole Riddle is a 57 y.o. female who presents to the emergency department today because of concern for chest pain. She describes it as tightness. It is located in her central and left chest. It started this morning. Gradually got worse until she felt she needed to call 911. Patient states that for the past four nights she has not gotten restful sleep. She has been out of her xanax. Additionally yesterday evening she had a migraine. When she woke up this morning she did feel tired. Describes it as feeling like a deflated balloon. Denies any fevers.    Per medical record review patient has a history of HLD, anxiety.  Past Medical History:  Diagnosis Date  . Anxiety 1997  . Bulimia 1997   per psychology note in chart  . Depression 1997  . Gastric ulcer 2012   NSAID induced   . Gastric ulcer due to Helicobacter pylori 4098   and NSAID use  . History of cardiac catheterization 2012   normal   . HLD (hyperlipidemia)   . IBS (irritable bowel syndrome) 06/17/2015  . Irritable bowel    with alternating constipation/bloating and diarrhea  . Migraines   . Multiple gastric ulcers    esophagus    Patient Active Problem List   Diagnosis Date Noted  . Chronic paronychia of toe, left 02/23/2018  . Myalgia due to statin 02/23/2018  . Vitamin D deficiency 02/23/2018  . Chronic left shoulder pain 02/23/2018  . H/O abdominal supracervical subtotal hysterectomy 02/22/2018  . Cervicalgia of occipito-atlanto-axial region 09/20/2017  . Chronic bilateral low back pain with right-sided sciatica 06/15/2017  . Fracture of thumb, right, closed 05/10/2016  . Visit for preventive health examination 12/27/2015  . IBS (irritable bowel  syndrome) 06/17/2015  . Insomnia 06/12/2015  . Rectal bleeding 02/23/2015  . Adaptive colitis 02/19/2015  . Cluster headache, episodic 10/09/2014  . Elevation of level of transaminase or lactic acid dehydrogenase (LDH) 05/15/2014  . Statin intolerance 12/02/2013  . Allergy to substance 12/02/2013  . Encounter for preventive health examination 08/05/2013  . Arthralgia of multiple joints 03/11/2013  . Anxiety and depression 10/27/2012  . Hyperlipidemia with target LDL less than 130 02/20/2012  . Menopause 10/02/2011  . Obesity 10/02/2011  . Headache, migraine 09/29/2011  . History of colonoscopy 09/29/2011  . History of gastric ulcer   . History of cardiac catheterization     Past Surgical History:  Procedure Laterality Date  . ABDOMINAL HYSTERECTOMY    . COLONOSCOPY    . ESOPHAGOGASTRODUODENOSCOPY      Prior to Admission medications   Medication Sig Start Date End Date Taking? Authorizing Provider  albuterol (PROVENTIL HFA;VENTOLIN HFA) 108 (90 Base) MCG/ACT inhaler Inhale 2 puffs into the lungs every 6 (six) hours as needed for wheezing or shortness of breath. 04/03/18   Crecencio Mc, MD  ALPRAZolam Duanne Moron) 0.5 MG tablet Take 1 tablet (0.5 mg total) by mouth 2 (two) times daily as needed for anxiety. TAKE 1 TABLET BY MOUTH EVERY NIGHT AT BEDTIME AS NEEDED FOR ANXIETY 09/19/17   Crecencio Mc, MD  Cephalexin 500 MG tablet Take 1 tablet (500 mg total) by mouth 4 (four) times daily. 02/22/18   Crecencio Mc, MD  cyanocobalamin (,VITAMIN B-12,) 1000  MCG/ML injection Inject 1 mL (1,000 mcg total) into the muscle once a week. 09/19/17   Crecencio Mc, MD  ergocalciferol (DRISDOL) 50000 units capsule Take 1 capsule (50,000 Units total) by mouth once a week. 02/23/18   Crecencio Mc, MD  ezetimibe (ZETIA) 10 MG tablet Take 1 tablet (10 mg total) by mouth daily. 02/28/18   Crecencio Mc, MD  predniSONE (DELTASONE) 10 MG tablet 6 tablets on Day 1 , then reduce by 1 tablet daily until  gone 02/24/18   Crecencio Mc, MD  predniSONE (DELTASONE) 10 MG tablet 6 tablets on Day 1 , then reduce by 1 tablet daily until gone 04/03/18   Crecencio Mc, MD  promethazine (PHENERGAN) 25 MG tablet Take 1 tablet (25 mg total) by mouth every 8 (eight) hours as needed for nausea or vomiting. 05/10/16   Crecencio Mc, MD  Syringe/Needle, Disp, (SYRINGE 3CC/25GX1") 25G X 1" 3 ML MISC Use for b12 injections 01/27/17   Crecencio Mc, MD  Syringe/Needle, Disp, (SYRINGE 3CC/25GX1") 25G X 1" 3 ML MISC Use for b12 injections 09/19/17   Crecencio Mc, MD  tiZANidine (ZANAFLEX) 2 MG tablet Take 1 tablet (2 mg total) every 6 (six) hours as needed by mouth for muscle spasms. 10/12/17   Crecencio Mc, MD  trimethoprim-polymyxin b Mayra Neer) ophthalmic solution  02/18/18   [provider]  venlafaxine XR (EFFEXOR-XR) 37.5 MG 24 hr capsule TAKE 2 CAPSULES EVERY DAY WITH BREAKFAST 02/27/18   Crecencio Mc, MD  zolmitriptan (ZOMIG-ZMT) 5 MG disintegrating tablet Take by mouth. 02/20/18   [provider]  estradiol (ESTRACE) 0.5 MG tablet Take 1 tablet (0.5 mg total) by mouth daily. 09/29/11 02/17/12  Crecencio Mc, MD    Allergies Pravastatin; Bactrim [sulfamethoxazole-trimethoprim]; Codeine sulfate; Lipitor [atorvastatin]; Nitroglycerin; Prednisone; Topamax [topiramate]; Vicodin [hydrocodone-acetaminophen]; and Nortriptyline  Family History  Problem Relation Age of Onset  . Heart disease Father 35       died of massive mi  . Heart disease Brother 41       has had 4 MIs, cardiomyopathy  . Celiac disease Brother   . Esophageal cancer Maternal Grandmother   . Stomach cancer Maternal Grandmother   . Irritable bowel syndrome Mother   . Ulcerative colitis Mother   . Breast cancer Neg Hx     Social History Social History   Tobacco Use  . Smoking status: Never Smoker  . Smokeless tobacco: Never Used  Substance Use Topics  . Alcohol use: Yes    Alcohol/week: 1.2 oz    Types: 2  Cans of beer per week    Comment: occasional-beer  . Drug use: No    Review of Systems Constitutional: Positive for fatigue. Eyes: No visual changes. ENT: No sore throat. Cardiovascular: Positive for chest pain. Respiratory: Denies shortness of breath. Gastrointestinal: No abdominal pain.  No nausea, no vomiting.  No diarrhea.   Genitourinary: Negative for dysuria. Musculoskeletal: Negative for back pain. Skin: Negative for rash. Neurological: Migraine yesterday.  ____________________________________________   PHYSICAL EXAM:  VITAL SIGNS: ED Triage Vitals  Enc Vitals Group     BP 04/19/18 1033 (!) 134/98     Pulse Rate 04/19/18 1033 87     Resp 04/19/18 1033 19     Temp --      Temp Source 04/19/18 1033 Oral     SpO2 04/19/18 1033 100 %     Weight 04/19/18 1034 170 lb (77.1 kg)  Height --      Head Circumference --      Peak Flow --      Pain Score 04/19/18 1034 4    Constitutional: Alert and oriented. Well appearing and in no distress. Eyes: Conjunctivae are normal.  ENT   Head: Normocephalic and atraumatic.   Nose: No congestion/rhinnorhea.   Mouth/Throat: Mucous membranes are moist.   Neck: No stridor. Hematological/Lymphatic/Immunilogical: No cervical lymphadenopathy. Cardiovascular: Normal rate, regular rhythm.  No murmurs, rubs, or gallops.  Respiratory: Normal respiratory effort without tachypnea nor retractions. Breath sounds are clear and equal bilaterally. No wheezes/rales/rhonchi. Gastrointestinal: Soft and non tender. No rebound. No guarding.  Genitourinary: Deferred Musculoskeletal: Normal range of motion in all extremities. No lower extremity edema. Neurologic:  Normal speech and language. No gross focal neurologic deficits are appreciated.  Skin:  Skin is warm, dry and intact. No rash noted. Psychiatric: Mood and affect are normal. Speech and behavior are normal. Patient exhibits appropriate insight and  judgment.  ____________________________________________    LABS (pertinent positives/negatives)  Trop <0.01 CBC wnl BMP wnl except glu 103  ____________________________________________   EKG  I, Nance Pear, attending physician, personally viewed and interpreted this EKG  EKG Time: 1029 Rate: 104 Rhythm: sinus tachycardia Axis: normal Intervals: qtc 458 QRS: narrow ST changes: no st elevation Impression: abnormal ekg   ____________________________________________    RADIOLOGY  CXR No active disease  ____________________________________________   PROCEDURES  Procedures  ____________________________________________   INITIAL IMPRESSION / ASSESSMENT AND PLAN / ED COURSE  Pertinent labs & imaging results that were available during my care of the patient were reviewed by me and considered in my medical decision making (see chart for details).  Patient presented to the emergency department today because of concerns for chest pain.  Differential be broad.  It would include esophagitis, pneumonia, pneumothorax, aortic dissection, PE amongst other etiologies. Work up without concerning findings. Low risk HEART score. Patient did feel better after GI cocktail. At this time doubt PE or aortic dissection given clinic history and exam. Given history of anxiety I do think GERD highly likely. Will give patient prescription for antacids and sucralfate. Discussed findings and plan with patient.   ____________________________________________   FINAL CLINICAL IMPRESSION(S) / ED DIAGNOSES  Final diagnoses:  Nonspecific chest pain  Esophagitis     Note: This dictation was prepared with Dragon dictation. Any transcriptional errors that result from this process are unintentional     Nance Pear, MD 04/19/18 1341

## 2018-04-19 NOTE — Telephone Encounter (Signed)
Pt states that she is on her way to the office and that she expects to have a printed copy of the rx ready and that she better not have to wait when she gets there.  She went to the pharmacy and they states they did not have the rx, it was faxed today.  She states that she has been nice about this up to this point, but she was no longer being nice anymore.

## 2018-04-19 NOTE — Telephone Encounter (Signed)
Copied from [place]. Topic: General - Other >> Apr 19, 2018  2:19 PM Yvette Rack wrote: Reason for CRM: [facility] calling stating that they never received the ALPRAZolam Duanne Moron) 0.5 MG tablet  patient keep calling them about this RX please fax

## 2018-04-19 NOTE — Discharge Instructions (Addendum)
Please seek medical attention for any high fevers, chest pain, shortness of breath, change in behavior, persistent vomiting, bloody stool or any other new or concerning symptoms.  

## 2018-04-19 NOTE — Telephone Encounter (Signed)
Caryl Pina called back to the pod to let us know that pt was out in the lobby waiting on her alprazolam prescription. The prescription had been printed off but not signed by doctor at that time. Pulled the prescription our of quick sign folder and placed on Dr. Lupita Dawn podium so she would see it as soon as she came out of the room with a pt. I walked up front to let Caryl Pina know that Dr. Derrel Nip was in the room with a pt and she would sign as soon as she came out. The pt was leaned up against the desk with the other lady that was with her and pt looked very upset. I went to get next pt for Dr. Derrel Nip and pt and friend were sitting out in lobby. As soon as Dr. Derrel Nip came out of the room I got her to sign the prescription and took it straight up to the pt. When prescription was handed to pt friend asked why did it take so long, I tried to explain it to them and they did not want to hear it. The pt stated "just forget let's get out of here". Pt and friend stormed out of office.

## 2018-04-19 NOTE — ED Triage Notes (Signed)
Pt to ED via [facility] from home for Sudden onset chest pain that radiates into left shoulder and back. Pt c/o periods of shortness of breath. Pt has dry cough that she states has been present for about 3 weeks. Pt recently treated for bronchitis. Pt is in NAD at this time. Pt is tearful on triage. States that she is under a considerable amount of stress at this time.

## 2018-04-19 NOTE — ED Notes (Signed)
Pt ambulatory to POV without difficulty. VSS. NAD. Discharge instructions, RX, and follow up discussed. All questions addressed.

## 2018-04-19 NOTE — Telephone Encounter (Signed)
Refilled: 09/19/2017 Last OV: 02/22/2018 Next OV: not scheduled

## 2018-04-19 NOTE — Telephone Encounter (Signed)
FYI

## 2018-04-20 MED ORDER — ALPRAZOLAM 0.5 MG PO TABS: 0.5000 mg | ORAL_TABLET | Freq: Two times a day (BID) | ORAL | 5 refills | Status: AC | PRN

## 2018-04-20 NOTE — Telephone Encounter (Signed)
Medication corrected in chart so this does not keep happening.

## 2018-04-20 NOTE — Telephone Encounter (Signed)
Patient called office back and stated that her friend will be coming by to pick up the new prescription.

## 2018-04-20 NOTE — Telephone Encounter (Signed)
Pt picked up from office.

## 2018-04-20 NOTE — Addendum Note (Signed)
Addended by: Crecencio Mc on: 04/20/2018 12:53 PM   Modules accepted: Orders

## 2018-04-20 NOTE — Telephone Encounter (Signed)
Pharmacy wasn't sure about the directions. They want to know if you want her to take just 2 times daily as needed is she to take it at bedtime as needed or daily. Please clarify. Thank you  Take 1 tablet (0.5 mg total) by mouth 2 (two) times daily as needed for anxiety. TAKE 1 TABLET BY MOUTH EVERY NIGHT AT BEDTIME AS NEEDED FOR ANXIETY

## 2018-04-20 NOTE — Telephone Encounter (Signed)
Pt calling and states that the pharmacy will not fill her xanax because the rx states "Not to exceed 4 additional fills before 03/14/2016" and legally they cannot fill the medication due to the wrong date on the rx. Pt states the instructions of how she should take the medication is not clear for the pharmacy and the pharmacy needs clarification on the instructions. Pts anxiety is very extreme today due to life events right now and that she has not been able to have this medication filled. Pt was very upset on the phone. Please advise.

## 2018-04-20 NOTE — Telephone Encounter (Signed)
Called in new script for patient to [facility] for patient . Patient notified.

## 2018-06-11 ENCOUNTER — Emergency Department
Admission: EM | Admit: 2018-06-11 | Discharge: 2018-06-11 | Disposition: A | Discharge status: Home / Self Care | Payer: BLUE CROSS/BLUE SHIELD | Attending: Emergency Medicine | Admitting: Emergency Medicine

## 2018-06-11 ENCOUNTER — Emergency Department: Payer: BLUE CROSS/BLUE SHIELD

## 2018-06-11 ENCOUNTER — Other Ambulatory Visit: Payer: Self-pay

## 2018-06-11 DIAGNOSIS — G43901 Migraine, unspecified, not intractable, with status migrainosus: Secondary | ICD-10-CM | POA: Diagnosis not present

## 2018-06-11 DIAGNOSIS — Z79899 Other long term (current) drug therapy: Secondary | ICD-10-CM | POA: Diagnosis not present

## 2018-06-11 DIAGNOSIS — Z3202 Encounter for pregnancy test, result negative: Secondary | ICD-10-CM | POA: Insufficient documentation

## 2018-06-11 DIAGNOSIS — R51 Headache: Secondary | ICD-10-CM | POA: Diagnosis present

## 2018-06-11 LAB — COMPREHENSIVE METABOLIC PANEL
ALBUMIN: 4.2 g/dL (ref 3.5–5.0)
ALT: 37 U/L (ref 0–44)
AST: 42 U/L — ABNORMAL HIGH (ref 15–41)
Alkaline Phosphatase: 85 U/L (ref 38–126)
Anion gap: 9 (ref 5–15)
BUN: 8 mg/dL (ref 6–20)
CHLORIDE: 108 mmol/L (ref 98–111)
CO2: 24 mmol/L (ref 22–32)
Calcium: 9.4 mg/dL (ref 8.9–10.3)
Creatinine, Ser: 0.82 mg/dL (ref 0.44–1.00)
GFR calc Af Amer: >60 mL/min (ref >60)
GLUCOSE: 108 mg/dL — AB (ref 70–99)
POTASSIUM: 4 mmol/L (ref 3.5–5.1)
SODIUM: 141 mmol/L (ref 135–145)
Total Bilirubin: 0.4 mg/dL (ref 0.3–1.2)
Total Protein: 7.9 g/dL (ref 6.5–8.1)

## 2018-06-11 LAB — DIFFERENTIAL
BASOS PCT: 1 %
Basophils Absolute: 0.1 10*3/uL (ref 0–0.1)
EOS PCT: 0 %
Eosinophils Absolute: 0 10*3/uL (ref 0–0.7)
LYMPHS PCT: 41 %
Lymphs Abs: 3 10*3/uL (ref 1.0–3.6)
MONO ABS: 0.4 10*3/uL (ref 0.2–0.9)
MONOS PCT: 6 %
NEUTROS ABS: 3.7 10*3/uL (ref 1.4–6.5)
NEUTROS PCT: 52 %

## 2018-06-11 LAB — CBC
HCT: 39.9 % (ref 35.0–47.0)
Hemoglobin: 13.5 g/dL (ref 12.0–16.0)
MCH: 28.4 pg (ref 26.0–34.0)
MCHC: 33.7 g/dL (ref 32.0–36.0)
MCV: 84.4 fL (ref 80.0–100.0)
PLATELETS: 282 10*3/uL (ref 150–440)
RBC: 4.73 MIL/uL (ref 3.80–5.20)
RDW: 13.9 % (ref 11.5–14.5)
WBC: 7.2 10*3/uL (ref 3.6–11.0)

## 2018-06-11 LAB — PROTIME-INR
INR: 0.92
PROTHROMBIN TIME: 12.3 s (ref 11.4–15.2)

## 2018-06-11 LAB — POCT PREGNANCY, URINE: PREG TEST UR: NEGATIVE

## 2018-06-11 LAB — APTT: aPTT: 33 seconds (ref 24–36)

## 2018-06-11 LAB — GLUCOSE, CAPILLARY: Glucose-Capillary: 98 mg/dL (ref 70–99)

## 2018-06-11 LAB — TROPONIN I

## 2018-06-11 MED ORDER — KETOROLAC TROMETHAMINE 30 MG/ML IJ SOLN
15.0000 mg | Freq: Once | INTRAMUSCULAR | Status: AC
  Administered 2018-06-11: 15 mg via INTRAVENOUS
  Filled 2018-06-11: qty 1

## 2018-06-11 MED ORDER — LIDOCAINE HCL (PF) 2 % IJ SOLN
10.0000 mL | Freq: Once | INTRAMUSCULAR | Status: DC
  Filled 2018-06-11: qty 10

## 2018-06-11 MED ORDER — HALOPERIDOL LACTATE 5 MG/ML IJ SOLN
2.0000 mg | Freq: Once | INTRAMUSCULAR | Status: AC
  Administered 2018-06-11: 2 mg via INTRAVENOUS
  Filled 2018-06-11: qty 1

## 2018-06-11 MED ORDER — BUTALBITAL-APAP-CAFFEINE 50-325-40 MG PO TABS: 1.0000 | ORAL_TABLET | Freq: Four times a day (QID) | ORAL | 0 refills | Status: AC | PRN

## 2018-06-11 MED ORDER — SODIUM CHLORIDE 0.9 % IV BOLUS
1000.0000 mL | Freq: Once | INTRAVENOUS | Status: AC
  Administered 2018-06-11: 1000 mL via INTRAVENOUS

## 2018-06-11 MED ORDER — DIPHENHYDRAMINE HCL 50 MG/ML IJ SOLN
25.0000 mg | Freq: Once | INTRAMUSCULAR | Status: AC
  Administered 2018-06-11: 25 mg via INTRAVENOUS
  Filled 2018-06-11: qty 1

## 2018-06-11 MED ORDER — MAGNESIUM SULFATE 2 GM/50ML IV SOLN
2.0000 g | Freq: Once | INTRAVENOUS | Status: AC
  Administered 2018-06-11: 2 g via INTRAVENOUS
  Filled 2018-06-11: qty 50

## 2018-06-11 MED ORDER — LIDOCAINE HCL (PF) 1 % IJ SOLN
INTRAMUSCULAR | Status: AC
  Administered 2018-06-11: 10 mL
  Filled 2018-06-11: qty 10

## 2018-06-11 MED ORDER — METOCLOPRAMIDE HCL 5 MG/ML IJ SOLN
10.0000 mg | Freq: Once | INTRAMUSCULAR | Status: AC
  Administered 2018-06-11: 10 mg via INTRAVENOUS
  Filled 2018-06-11: qty 2

## 2018-06-11 NOTE — ED Notes (Signed)
Pt in MRI.

## 2018-06-11 NOTE — ED Provider Notes (Signed)
[facility] Emergency Department Provider Note  ____________________________________________  Time seen: Approximately 6:36 PM  I have reviewed the triage vital signs and the nursing notes.   HISTORY  Chief Complaint Headache; Weakness; and Facial Droop   HPI Nichole Riddle is a 57 y.o. female with history of severe migraine headaches who presents for evaluation of a migraine headache.  Patient reports that for the last 3 days she has had a back-to-back migraine headaches.  The pain is located in the left side of her face, sharp, constant and nonradiating.  Patient is on zolmitriptan which usually helps but is not helping this time.  She reports that the headaches are identical to her prior migraines however she has never had so many in such a short period of time.  Patient has been on several medications before, she is followed by neurology, she had lidocaine injections intranasally for her migraines.  She denies fever neck stiffness.  She has had photophobia and phonophobia, blurry vision in the left eye which are all consistent with her chronic migraine headaches.  Her friend went to see her today and noticed the patient's speech was slurred, she was confused, and noticed a right-sided facial droop which prompted her visit to the emergency room.  Past Medical History:  Diagnosis Date  . Anxiety 1997  . Bulimia 1997   per psychology note in chart  . Depression 1997  . Gastric ulcer 2012   NSAID induced   . Gastric ulcer due to Helicobacter pylori 0272   and NSAID use  . History of cardiac catheterization 2012   normal   . HLD (hyperlipidemia)   . IBS (irritable bowel syndrome) 06/17/2015  . Irritable bowel    with alternating constipation/bloating and diarrhea  . Migraines   . Multiple gastric ulcers    esophagus    Patient Active Problem List   Diagnosis Date Noted  . Chronic paronychia of toe, left 02/23/2018  . Myalgia due to statin 02/23/2018    . Vitamin D deficiency 02/23/2018  . Chronic left shoulder pain 02/23/2018  . H/O abdominal supracervical subtotal hysterectomy 02/22/2018  . Cervicalgia of occipito-atlanto-axial region 09/20/2017  . Chronic bilateral low back pain with right-sided sciatica 06/15/2017  . Fracture of thumb, right, closed 05/10/2016  . Visit for preventive health examination 12/27/2015  . IBS (irritable bowel syndrome) 06/17/2015  . Insomnia 06/12/2015  . Rectal bleeding 02/23/2015  . Adaptive colitis 02/19/2015  . Cluster headache, episodic 10/09/2014  . Elevation of level of transaminase or lactic acid dehydrogenase (LDH) 05/15/2014  . Statin intolerance 12/02/2013  . Allergy to substance 12/02/2013  . Encounter for preventive health examination 08/05/2013  . Arthralgia of multiple joints 03/11/2013  . Anxiety and depression 10/27/2012  . Hyperlipidemia with target LDL less than 130 02/20/2012  . Menopause 10/02/2011  . Obesity 10/02/2011  . Headache, migraine 09/29/2011  . History of colonoscopy 09/29/2011  . History of gastric ulcer   . History of cardiac catheterization     Past Surgical History:  Procedure Laterality Date  . ABDOMINAL HYSTERECTOMY    . COLONOSCOPY    . ESOPHAGOGASTRODUODENOSCOPY      Prior to Admission medications   Medication Sig Start Date End Date Taking? Authorizing Provider  albuterol (PROVENTIL HFA;VENTOLIN HFA) 108 (90 Base) MCG/ACT inhaler Inhale 2 puffs into the lungs every 6 (six) hours as needed for wheezing or shortness of breath. 04/03/18  Yes Crecencio Mc, MD  ALPRAZolam Duanne Moron) 0.5 MG tablet Take  1 tablet (0.5 mg total) by mouth 2 (two) times daily as needed for anxiety. 04/20/18  Yes Crecencio Mc, MD  ciprofloxacin (CIPRO) 250 MG tablet Take 250 mg by mouth 2 (two) times daily. 06/10/18 06/17/18 Yes [provider]  cyanocobalamin (,VITAMIN B-12,) 1000 MCG/ML injection Inject 1 mL (1,000 mcg total) into the muscle once a week. 09/19/17  Yes  Crecencio Mc, MD  Polyvinyl Alcohol-Povidone (REFRESH OP) Apply 1 drop to eye as needed (Dry eyes).   Yes [provider]  Probiotic Product (ALIGN) 4 MG CAPS Take 4 mg by mouth daily.   Yes [provider]  promethazine (PHENERGAN) 25 MG tablet Take 1 tablet (25 mg total) by mouth every 8 (eight) hours as needed for nausea or vomiting. 05/10/16  Yes Crecencio Mc, MD  venlafaxine XR (EFFEXOR-XR) 150 MG 24 hr capsule Take 150 mg by mouth daily with breakfast.   Yes [provider]  zolmitriptan (ZOMIG-ZMT) 5 MG disintegrating tablet Take 5 mg by mouth as needed for migraine.  02/20/18  Yes [provider]  butalbital-acetaminophen-caffeine (FIORICET, ESGIC) 50-325-40 MG tablet Take 1-2 tablets by mouth every 6 (six) hours as needed for headache. 06/11/18 06/11/19  Alfred Levins, [place], MD  ergocalciferol (DRISDOL) 50000 units capsule Take 1 capsule (50,000 Units total) by mouth once a week. Patient not taking: Reported on 06/11/2018 02/23/18   Crecencio Mc, MD  ezetimibe (ZETIA) 10 MG tablet Take 1 tablet (10 mg total) by mouth daily. Patient not taking: Reported on 06/11/2018 02/28/18   Crecencio Mc, MD  famotidine (PEPCID) 40 MG tablet Take 1 tablet (40 mg total) by mouth every evening. Patient not taking: Reported on 06/11/2018 04/19/18 04/19/19  Nance Pear, MD  predniSONE (DELTASONE) 10 MG tablet 6 tablets on Day 1 , then reduce by 1 tablet daily until gone Patient not taking: Reported on 06/11/2018 02/24/18   Crecencio Mc, MD  predniSONE (DELTASONE) 10 MG tablet 6 tablets on Day 1 , then reduce by 1 tablet daily until gone Patient not taking: Reported on 06/11/2018 04/03/18   Crecencio Mc, MD  sucralfate (CARAFATE) 1 g tablet Take 1 tablet (1 g total) by mouth 4 (four) times daily. Patient not taking: Reported on 06/11/2018 04/19/18   Nance Pear, MD  Syringe/Needle, Disp, (SYRINGE 3CC/25GX1") 25G X 1" 3 ML MISC Use for b12 injections 01/27/17   Crecencio Mc, MD  Syringe/Needle, Disp, (SYRINGE 3CC/25GX1") 25G X 1" 3 ML MISC Use for b12 injections 09/19/17   Crecencio Mc, MD  tiZANidine (ZANAFLEX) 2 MG tablet Take 1 tablet (2 mg total) every 6 (six) hours as needed by mouth for muscle spasms. Patient not taking: Reported on 06/11/2018 10/12/17   Crecencio Mc, MD  venlafaxine XR (EFFEXOR-XR) 37.5 MG 24 hr capsule TAKE 2 CAPSULES EVERY DAY WITH BREAKFAST Patient not taking: Reported on 06/11/2018 02/27/18   Crecencio Mc, MD  estradiol (ESTRACE) 0.5 MG tablet Take 1 tablet (0.5 mg total) by mouth daily. 09/29/11 02/17/12  Crecencio Mc, MD    Allergies Nitroglycerin; Pravastatin; Bactrim [sulfamethoxazole-trimethoprim]; Codeine sulfate; Lipitor [atorvastatin]; Prednisone; Topamax [topiramate]; Vicodin [hydrocodone-acetaminophen]; and Nortriptyline  Family History  Problem Relation Age of Onset  . Heart disease Father 24       died of massive mi  . Heart disease Brother 21       has had 4 MIs, cardiomyopathy  . Celiac disease Brother   . Esophageal cancer Maternal Grandmother   .  Stomach cancer Maternal Grandmother   . Irritable bowel syndrome Mother   . Ulcerative colitis Mother   . Breast cancer Neg Hx     Social History Social History   Tobacco Use  . Smoking status: Never Smoker  . Smokeless tobacco: Never Used  Substance Use Topics  . Alcohol use: Yes    Alcohol/week: 1.2 oz    Types: 2 Cans of beer per week    Comment: occasional-beer  . Drug use: No    Review of Systems  Constitutional: Negative for fever. Eyes: + L sided blurry vision ENT: Negative for sore throat. Neck: No neck pain  Cardiovascular: Negative for chest pain. Respiratory: Negative for shortness of breath. Gastrointestinal: Negative for abdominal pain, vomiting or diarrhea. Genitourinary: Negative for dysuria. Musculoskeletal: Negative for back pain. Skin: Negative for rash. Neurological: + headaches, photophobia and phonophobia, R  sided facial droop. No weakness or numbness. Psych: No SI or HI  ____________________________________________   PHYSICAL EXAM:  VITAL SIGNS: ED Triage Vitals  Enc Vitals Group     BP 06/11/18 1447 132/76     Pulse Rate 06/11/18 1447 99     Resp 06/11/18 1447 18     Temp 06/11/18 1447 99.1 F (37.3 C)     Temp Source 06/11/18 1447 Oral     SpO2 06/11/18 1447 99 %     Weight 06/11/18 1441 169 lb (76.7 kg)     Height 06/11/18 1441 5\' 3"  (1.6 m)     Head Circumference --      Peak Flow --      Pain Score 06/11/18 1440 10     Pain Loc --      Pain Edu? --      Excl. in [place]? --     Constitutional: Alert and oriented, laying in the dark room  HEENT:      Head: Normocephalic and atraumatic.         Eyes: Conjunctivae are normal. Sclera is non-icteric.       Mouth/Throat: Mucous membranes are moist.       Neck: Supple with no signs of meningismus. Cardiovascular: Regular rate and rhythm. No murmurs, gallops, or rubs. 2+ symmetrical distal pulses are present in all extremities. No JVD. Respiratory: Normal respiratory effort. Lungs are clear to auscultation bilaterally. No wheezes, crackles, or rhonchi.  Gastrointestinal: Soft, non tender, and non distended with positive bowel sounds. No rebound or guarding. Musculoskeletal: Nontender with normal range of motion in all extremities. No edema, cyanosis, or erythema of extremities. Neurologic: Normal speech and language. A & O x3, PERRL, EOMI, no nystagmus, CN II-XII intact, motor testing reveals good tone and bulk throughout. There is no evidence of pronator drift or dysmetria. Muscle strength is 5/5 throughout. Sensory examination is intact. Gait is normal. Skin: Skin is warm, dry and intact. No rash noted. Psychiatric: Mood and affect are normal. Speech and behavior are normal.  ____________________________________________   LABS (all labs ordered are listed, but only abnormal results are displayed)  Labs Reviewed  COMPREHENSIVE  METABOLIC PANEL - Abnormal; Notable for the following components:      Result Value   Glucose, Bld 108 (*)    AST 42 (*)    All other components within normal limits  PROTIME-INR  APTT  CBC  DIFFERENTIAL  TROPONIN I  GLUCOSE, CAPILLARY  POC URINE PREG, ED   ____________________________________________  EKG  ED ECG REPORT I, Rudene Re, the attending physician, personally viewed and interpreted this ECG.  Normal sinus rhythm, rate of 77, normal intervals, normal axis, no ST elevations or depressions. Normal EKG ____________________________________________  RADIOLOGY  I have personally reviewed the images performed during this visit and I agree with the Radiologist's read.   Interpretation by Radiologist:  Ct Head Wo Contrast  Result Date: 06/11/2018 CLINICAL DATA:  Worst headache of life EXAM: CT HEAD WITHOUT CONTRAST TECHNIQUE: Contiguous axial images were obtained from the base of the skull through the vertex without intravenous contrast. COMPARISON:  MRI head 10/03/2017 FINDINGS: Brain: No evidence of acute infarction, hemorrhage, hydrocephalus, extra-axial collection or mass lesion/mass effect. Vascular: Negative for hyperdense vessel Skull: Negative Sinuses/Orbits: Negative Other: None IMPRESSION: Negative CT head Electronically Signed   By: Franchot Gallo M.D.   On: 06/11/2018 15:16   Mr Brain Wo Contrast  Result Date: 06/11/2018 CLINICAL DATA:  Persistent headache over the last 4 days. Facial droop today. Right arm tingling. EXAM: MRI HEAD WITHOUT CONTRAST TECHNIQUE: Multiplanar, multiecho pulse sequences of the brain and surrounding structures were obtained without intravenous contrast. COMPARISON:  CT same day.  MRI 10/03/2017. FINDINGS: Brain: Brain has normal appearance without evidence of malformation, atrophy, old or acute small or large vessel infarction, mass lesion, hemorrhage, hydrocephalus or extra-axial collection. Vascular: Major vessels at the base of the  brain show flow. Venous sinuses appear patent. Skull and upper cervical spine: Normal. Sinuses/Orbits: Clear/normal. Other: None significant. Electronically Signed   By: Nelson Chimes M.D.   On: 06/11/2018 19:54     ____________________________________________   PROCEDURES  Procedure(s) performed: None Procedures Critical Care performed:  None ____________________________________________   INITIAL IMPRESSION / ASSESSMENT AND PLAN / ED COURSE   57 y.o. female with history of severe migraine headaches who presents for evaluation of a migraine headache.  Patient reports that the headache is similar to her prior migraines however she has had them more often over the last 3 days and according to her friend patient seems to have some neurological deficits earlier today including slurred speech and right-sided facial droop.  At this time patient is completely neurologically intact, there is no clinical signs or symptoms of meningitis or acute angle-closure glaucoma.  Patient denies any family history of subarachnoid hemorrhage or aneurysm.  She denies thunderclap headache.  Head CT is negative however we will do a MRI to rule out stroke.  Will treat with migraine cocktail.  If patient's MRI is negative and her symptoms are improved we will plan to discharge patient to follow-up with her neurologist.    _________________________ 8:03 PM on 06/11/2018 -----------------------------------------  She has headache improved with a migraine cocktail however now she is complained of the pain is back up at a 7.  MRI is negative for stroke.  Will give nebulized lidocaine, 2 mg of IV Haldol and a second dose of IV Toradol.  Anticipate discharge with Fioricet and follow-up with her neurologist.   As part of my medical decision making, I reviewed the following data within the [facility] notes reviewed and incorporated, Labs reviewed , Old chart reviewed, Radiograph reviewed , Notes from  prior ED visits and [place] Controlled Substance Database    Pertinent labs & imaging results that were available during my care of the patient were reviewed by me and considered in my medical decision making (see chart for details).    ____________________________________________   FINAL CLINICAL IMPRESSION(S) / ED DIAGNOSES  Final diagnoses:  Migraine with status migrainosus, not intractable, unspecified migraine type  NEW MEDICATIONS STARTED DURING THIS VISIT:  ED Discharge Orders        Ordered    butalbital-acetaminophen-caffeine (FIORICET, ESGIC) 50-325-40 MG tablet  Every 6 hours PRN     06/11/18 2003       Note:  This document was prepared using Dragon voice recognition software and may include unintentional dictation errors.    Alfred Levins, [place], MD 06/11/18 2004

## 2018-06-11 NOTE — ED Triage Notes (Addendum)
Pt to ED reporting continued headache for the for the past four days without relief. Pts friend noted facial droop this afternoon but last known well is yesterday. Pt reports her right arm is tingling but no weakness noted. No weakness or changes in sensation in left leg. Pt is alert and oriented x 4 and no neuro deficits noted. Pt tearful and reports photosensitivity with a hx of migraines. Pt reports having seen a specialist in the past.

## 2018-06-11 NOTE — ED Triage Notes (Signed)
Pt states intermittent migraines since Thursday. States takes a dissolving tablet for migraines. States pain in neck and posterior head. Pt crying in triage. When talking pt doesn't appear to have facial droop. Moving all extremities on own. Sensitivity to light. States blurred vision when migraine first starts. Denies taking any blood thinners.

## 2018-06-15 DIAGNOSIS — N952 Postmenopausal atrophic vaginitis: Secondary | ICD-10-CM | POA: Insufficient documentation

## 2018-06-22 ENCOUNTER — Other Ambulatory Visit: Payer: Self-pay | Admitting: Nurse Practitioner

## 2018-06-22 DIAGNOSIS — Z1231 Encounter for screening mammogram for malignant neoplasm of breast: Secondary | ICD-10-CM

## 2018-08-28 ENCOUNTER — Other Ambulatory Visit: Payer: Self-pay | Admitting: Cardiovascular Disease

## 2018-08-28 DIAGNOSIS — R160 Hepatomegaly, not elsewhere classified: Secondary | ICD-10-CM

## 2018-09-10 ENCOUNTER — Ambulatory Visit
Admission: RE | Admit: 2018-09-10 | Discharge: 2018-09-10 | Disposition: A | Discharge status: Home / Self Care | Payer: BLUE CROSS/BLUE SHIELD | Source: Ambulatory Visit | Attending: Cardiovascular Disease | Admitting: Cardiovascular Disease

## 2018-09-10 DIAGNOSIS — R160 Hepatomegaly, not elsewhere classified: Secondary | ICD-10-CM | POA: Insufficient documentation

## 2018-09-10 DIAGNOSIS — I700 Atherosclerosis of aorta: Secondary | ICD-10-CM | POA: Diagnosis not present

## 2018-09-10 DIAGNOSIS — C228 Malignant neoplasm of liver, primary, unspecified as to type: Secondary | ICD-10-CM | POA: Insufficient documentation

## 2018-09-10 MED ORDER — GADOBUTROL 1 MMOL/ML IV SOLN
8.0000 mL | Freq: Once | INTRAVENOUS | Status: AC | PRN
  Administered 2018-09-10: 8 mL via INTRAVENOUS

## 2018-11-27 ENCOUNTER — Encounter: Payer: Self-pay | Admitting: *Deleted

## 2018-11-27 ENCOUNTER — Ambulatory Visit: Payer: BLUE CROSS/BLUE SHIELD | Admitting: Gastroenterology

## 2019-01-04 ENCOUNTER — Ambulatory Visit: Payer: BLUE CROSS/BLUE SHIELD | Admitting: Gastroenterology

## 2019-01-21 ENCOUNTER — Other Ambulatory Visit: Payer: Self-pay

## 2019-01-21 ENCOUNTER — Emergency Department
Admission: EM | Admit: 2019-01-21 | Discharge: 2019-01-21 | Disposition: A | Discharge status: Home / Self Care | Payer: BLUE CROSS/BLUE SHIELD | Attending: Emergency Medicine | Admitting: Emergency Medicine

## 2019-01-21 DIAGNOSIS — H9202 Otalgia, left ear: Secondary | ICD-10-CM | POA: Diagnosis present

## 2019-01-21 DIAGNOSIS — Z79899 Other long term (current) drug therapy: Secondary | ICD-10-CM | POA: Insufficient documentation

## 2019-01-21 DIAGNOSIS — M26622 Arthralgia of left temporomandibular joint: Secondary | ICD-10-CM | POA: Insufficient documentation

## 2019-01-21 MED ORDER — PREDNISONE 10 MG PO TABS: ORAL_TABLET | ORAL | 0 refills | Status: AC

## 2019-01-21 MED ORDER — DEXAMETHASONE SODIUM PHOSPHATE 4 MG/ML IJ SOLN
10.0000 mg | Freq: Once | INTRAMUSCULAR | Status: AC
  Administered 2019-01-21: 10 mg via INTRAMUSCULAR
  Filled 2019-01-21: qty 3

## 2019-01-21 MED ORDER — KETOROLAC TROMETHAMINE 30 MG/ML IJ SOLN
30.0000 mg | Freq: Once | INTRAMUSCULAR | Status: AC
  Administered 2019-01-21: 30 mg via INTRAMUSCULAR
  Filled 2019-01-21: qty 1

## 2019-01-21 MED ORDER — OXYCODONE HCL 5 MG PO TABS: 5.0000 mg | ORAL_TABLET | Freq: Three times a day (TID) | ORAL | 0 refills | Status: AC | PRN

## 2019-01-21 NOTE — ED Triage Notes (Signed)
Pt states left ear pain that runs down her neck and up her head. Pt states this started about a week ago.  Pt denies fever, chills, sore throat or congestion.  Pt denies any drainage from ear but states itching.

## 2019-01-21 NOTE — ED Notes (Signed)
See triage note   presents with sinus pressure and ear pain  States pain moves from ear into neck

## 2019-01-21 NOTE — ED Provider Notes (Signed)
[facility] Provider Note   CSN: [id number] Arrival date & time: 01/21/19  0825    History   Chief Complaint Chief Complaint  Patient presents with  . Otalgia    HPI Nichole Riddle is a 58 y.o. female.  Presents to the emergency department for evaluation of 1 week history of left-sided ear pain.  She points to the left TMJ and describes a constant aching pain along the left TMJ.  She has pain with chewing and eating.  She believes she is also grinding her teeth at night.  Pain radiates down the left jaw but she denies any sharp shooting pains, burning numbness or tingling.  She denies any headache, fevers or neck pain.  No trauma or injury.  She is taken occasional half of a oxycodone 5 mg tablet from leftover prescriptions with mild relief.  She denies any dental pain.  She states her TMJ pops when she chews and opens her jaw.  No rashes or vision changes.     HPI  Past Medical History:  Diagnosis Date  . Anxiety 1997  . Bulimia 1997   per psychology note in chart  . Depression 1997  . Gastric ulcer 2012   NSAID induced   . Gastric ulcer due to Helicobacter pylori 2119   and NSAID use  . History of cardiac catheterization 2012   normal   . HLD (hyperlipidemia)   . IBS (irritable bowel syndrome) 06/17/2015  . Irritable bowel    with alternating constipation/bloating and diarrhea  . Migraines   . Multiple gastric ulcers    esophagus    Patient Active Problem List   Diagnosis Date Noted  . Chronic paronychia of toe, left 02/23/2018  . Myalgia due to statin 02/23/2018  . Vitamin D deficiency 02/23/2018  . Chronic left shoulder pain 02/23/2018  . H/O abdominal supracervical subtotal hysterectomy 02/22/2018  . Cervicalgia of occipito-atlanto-axial region 09/20/2017  . Chronic bilateral low back pain with right-sided sciatica 06/15/2017  . Fracture of thumb, right, closed 05/10/2016  . Visit for preventive health examination  12/27/2015  . IBS (irritable bowel syndrome) 06/17/2015  . Insomnia 06/12/2015  . Rectal bleeding 02/23/2015  . Adaptive colitis 02/19/2015  . Cluster headache, episodic 10/09/2014  . Elevation of level of transaminase or lactic acid dehydrogenase (LDH) 05/15/2014  . Statin intolerance 12/02/2013  . Allergy to substance 12/02/2013  . Encounter for preventive health examination 08/05/2013  . Arthralgia of multiple joints 03/11/2013  . Anxiety and depression 10/27/2012  . Hyperlipidemia with target LDL less than 130 02/20/2012  . Menopause 10/02/2011  . Obesity 10/02/2011  . Headache, migraine 09/29/2011  . History of colonoscopy 09/29/2011  . History of gastric ulcer   . History of cardiac catheterization     Past Surgical History:  Procedure Laterality Date  . ABDOMINAL HYSTERECTOMY    . COLONOSCOPY    . ESOPHAGOGASTRODUODENOSCOPY       OB History   No obstetric history on file.      Home Medications    Prior to Admission medications   Medication Sig Start Date End Date Taking? Authorizing Provider  albuterol (PROVENTIL HFA;VENTOLIN HFA) 108 (90 Base) MCG/ACT inhaler Inhale 2 puffs into the lungs every 6 (six) hours as needed for wheezing or shortness of breath. 04/03/18   Crecencio Mc, MD  ALPRAZolam Duanne Moron) 0.5 MG tablet Take 1 tablet (0.5 mg total) by mouth 2 (two) times daily as needed for anxiety. 04/20/18   Derrel Nip,  Aris Everts, MD  butalbital-acetaminophen-caffeine (FIORICET, ESGIC) [phone] MG tablet Take 1-2 tablets by mouth every 6 (six) hours as needed for headache. 06/11/18 06/11/19  Alfred Levins, [place], MD  cyanocobalamin (,VITAMIN B-12,) 1000 MCG/ML injection Inject 1 mL (1,000 mcg total) into the muscle once a week. 09/19/17   Crecencio Mc, MD  ezetimibe (ZETIA) 10 MG tablet Take 1 tablet (10 mg total) by mouth daily. Patient not taking: Reported on 06/11/2018 02/28/18   Crecencio Mc, MD  famotidine (PEPCID) 40 MG tablet Take 1 tablet (40 mg total) by mouth  every evening. Patient not taking: Reported on 06/11/2018 04/19/18 04/19/19  Nance Pear, MD  oxyCODONE (ROXICODONE) 5 MG immediate release tablet Take 1 tablet (5 mg total) by mouth every 8 (eight) hours as needed. 01/21/19 01/21/20  Duanne Guess, PA-C  Polyvinyl Alcohol-Povidone (REFRESH OP) Apply 1 drop to eye as needed (Dry eyes).    [provider]  predniSONE (DELTASONE) 10 MG tablet 10 day taper. 5,5,4,4,3,3,2,2,1,1 01/21/19   Duanne Guess, PA-C  Probiotic Product (ALIGN) 4 MG CAPS Take 4 mg by mouth daily.    [provider]  promethazine (PHENERGAN) 25 MG tablet Take 1 tablet (25 mg total) by mouth every 8 (eight) hours as needed for nausea or vomiting. 05/10/16   Crecencio Mc, MD  Syringe/Needle, Disp, (SYRINGE 3CC/25GX1") 25G X 1" 3 ML MISC Use for b12 injections 01/27/17   Crecencio Mc, MD  Syringe/Needle, Disp, (SYRINGE 3CC/25GX1") 25G X 1" 3 ML MISC Use for b12 injections 09/19/17   Crecencio Mc, MD  venlafaxine XR (EFFEXOR-XR) 150 MG 24 hr capsule Take 150 mg by mouth daily with breakfast.    [provider]  venlafaxine XR (EFFEXOR-XR) 37.5 MG 24 hr capsule TAKE 2 CAPSULES EVERY DAY WITH BREAKFAST Patient not taking: Reported on 06/11/2018 02/27/18   Crecencio Mc, MD  zolmitriptan (ZOMIG-ZMT) 5 MG disintegrating tablet Take 5 mg by mouth as needed for migraine.  02/20/18   [provider]  estradiol (ESTRACE) 0.5 MG tablet Take 1 tablet (0.5 mg total) by mouth daily. 09/29/11 02/17/12  Crecencio Mc, MD    Family History Family History  Problem Relation Age of Onset  . Heart disease Father 62       died of massive mi  . Heart disease Brother 34       has had 4 MIs, cardiomyopathy  . Celiac disease Brother   . Esophageal cancer Maternal Grandmother   . Stomach cancer Maternal Grandmother   . Irritable bowel syndrome Mother   . Ulcerative colitis Mother   . Breast cancer Neg Hx     Social History Social History   Tobacco  Use  . Smoking status: Never Smoker  . Smokeless tobacco: Never Used  Substance Use Topics  . Alcohol use: Yes    Alcohol/week: 2.0 standard drinks    Types: 2 Cans of beer per week    Comment: occasional-beer  . Drug use: No     Allergies   Nitroglycerin; Pravastatin; Bactrim [sulfamethoxazole-trimethoprim]; Codeine sulfate; Lipitor [atorvastatin]; Prednisone; Topamax [topiramate]; Vicodin [hydrocodone-acetaminophen]; and Nortriptyline   Review of Systems Review of Systems  Constitutional: Negative for chills and fever.  Eyes: Negative for photophobia, pain and visual disturbance.  Respiratory: Negative for shortness of breath.   Cardiovascular: Negative for chest pain.  Gastrointestinal: Negative for abdominal pain.  Musculoskeletal: Positive for arthralgias. Negative for back pain and myalgias.  Skin: Negative for rash.  Neurological: Negative  for dizziness, numbness and headaches.     Physical Exam Updated Vital Signs BP 140/88 (BP Location: Left Arm)   Pulse 88   Temp 98.6 F (37 C) (Oral)   Resp 16   Ht 5\' 3"  (1.6 m)   Wt 74.8 kg   SpO2 96%   BMI 29.23 kg/m   Physical Exam Constitutional:      Appearance: She is well-developed.  HENT:     Head: Normocephalic and atraumatic.     Comments: Left TMJ extremely tender to palpation with no swelling warmth or redness.  No rashes noted.  Internal and external ear exam on the left side normal.  No sensation loss noted along the trigeminal nerve distribution.  No neurological deficits, cranial nerves II through XII intact.    Left Ear: Tympanic membrane, ear canal and external ear normal. There is no impacted cerumen.     Nose: Nose normal.  Eyes:     Conjunctiva/sclera: Conjunctivae normal.  Neck:     Musculoskeletal: Normal range of motion.  Cardiovascular:     Rate and Rhythm: Normal rate.  Pulmonary:     Effort: Pulmonary effort is normal. No respiratory distress.  Musculoskeletal: Normal range of motion.    Skin:    General: Skin is warm.     Findings: No rash.  Neurological:     General: No focal deficit present.     Mental Status: She is alert and oriented to person, place, and time.     Cranial Nerves: No cranial nerve deficit.  Psychiatric:        Behavior: Behavior normal.        Thought Content: Thought content normal.        Judgment: Judgment normal.      ED Treatments / Results  Labs (all labs ordered are listed, but only abnormal results are displayed) Labs Reviewed - No data to display  EKG None  Radiology No results found.  Procedures Procedures (including critical care time)  Medications Ordered in ED Medications  ketorolac (TORADOL) 30 MG/ML injection 30 mg (has no administration in time range)  dexamethasone (DECADRON) injection 10 mg (has no administration in time range)     Initial Impression / Assessment and Plan / ED Course  I have reviewed the triage vital signs and the nursing notes.  Pertinent labs & imaging results that were available during my care of the patient were reviewed by me and considered in my medical decision making (see chart for details).        58 year old female with left TMJ arthralgia.  She is given intramuscular injection of Toradol and dexamethasone.  She will start a 10-day steroid taper and use oxycodone as needed for severe pain.  She is encouraged to eat a soft diet and apply warm compresses to the left TMJ.  Patient also encouraged to use a nightguard to help with dental grinding.  She understands signs and symptoms return to the ED for.  She will follow-up with PCP or ENT physician in 1 week if no improvement.  Final Clinical Impressions(s) / ED Diagnoses   Final diagnoses:  Arthralgia of left temporomandibular joint    ED Discharge Orders         Ordered    predniSONE (DELTASONE) 10 MG tablet     01/21/19 0912    oxyCODONE (ROXICODONE) 5 MG immediate release tablet  Every 8 hours PRN     01/21/19 0912  Duanne Guess, PA-C 01/21/19 [id number]    Lavonia Drafts, MD 01/21/19 [phone]

## 2019-01-21 NOTE — Discharge Instructions (Addendum)
Please take medications as prescribed.  Take oxycodone as needed for severe pain.  Please eat a soft diet and apply warm compresses to the left TMJ.  Follow-up with primary care provider or ENT physician in 7 to 10 days if no improvement.  Return to the ER for any worsening symptoms or urgent changes in health.

## 2019-02-14 ENCOUNTER — Other Ambulatory Visit: Payer: Self-pay | Admitting: *Deleted

## 2019-02-14 DIAGNOSIS — R6889 Other general symptoms and signs: Secondary | ICD-10-CM

## 2019-02-15 ENCOUNTER — Ambulatory Visit: Payer: BLUE CROSS/BLUE SHIELD | Admitting: Neurology

## 2019-02-20 LAB — NOVEL CORONAVIRUS, NAA: SARS-CoV-2, NAA: NOT DETECTED

## 2019-03-26 ENCOUNTER — Emergency Department: Payer: BLUE CROSS/BLUE SHIELD

## 2019-03-26 ENCOUNTER — Other Ambulatory Visit: Payer: Self-pay

## 2019-03-26 ENCOUNTER — Emergency Department
Admission: EM | Admit: 2019-03-26 | Discharge: 2019-03-26 | Disposition: A | Discharge status: Home / Self Care | Payer: BLUE CROSS/BLUE SHIELD | Attending: Student in an Organized Health Care Education/Training Program | Admitting: Student in an Organized Health Care Education/Training Program

## 2019-03-26 ENCOUNTER — Encounter: Payer: Self-pay | Admitting: Emergency Medicine

## 2019-03-26 DIAGNOSIS — L509 Urticaria, unspecified: Secondary | ICD-10-CM | POA: Diagnosis not present

## 2019-03-26 DIAGNOSIS — Z79899 Other long term (current) drug therapy: Secondary | ICD-10-CM | POA: Diagnosis not present

## 2019-03-26 DIAGNOSIS — R079 Chest pain, unspecified: Secondary | ICD-10-CM | POA: Diagnosis not present

## 2019-03-26 DIAGNOSIS — R911 Solitary pulmonary nodule: Secondary | ICD-10-CM

## 2019-03-26 LAB — COMPREHENSIVE METABOLIC PANEL
ALT: 26 U/L (ref 0–44)
AST: 25 U/L (ref 15–41)
Albumin: 3.5 g/dL (ref 3.5–5.0)
Alkaline Phosphatase: 71 U/L (ref 38–126)
Anion gap: 11 (ref 5–15)
BUN: 10 mg/dL (ref 6–20)
CO2: 21 mmol/L — ABNORMAL LOW (ref 22–32)
Calcium: 8.6 mg/dL — ABNORMAL LOW (ref 8.9–10.3)
Chloride: 105 mmol/L (ref 98–111)
Creatinine, Ser: 0.93 mg/dL (ref 0.44–1.00)
GFR calc Af Amer: >60 mL/min (ref >60)
GFR calc non Af Amer: >60 mL/min (ref >60)
Glucose, Bld: 127 mg/dL — ABNORMAL HIGH (ref 70–99)
Potassium: 3.5 mmol/L (ref 3.5–5.1)
Sodium: 137 mmol/L (ref 135–145)
Total Bilirubin: 0.8 mg/dL (ref 0.3–1.2)
Total Protein: 6.7 g/dL (ref 6.5–8.1)

## 2019-03-26 LAB — CBC WITH DIFFERENTIAL/PLATELET
Abs Immature Granulocytes: 0.13 10*3/uL — ABNORMAL HIGH (ref 0.00–0.07)
Basophils Absolute: 0 10*3/uL (ref 0.0–0.1)
Basophils Relative: 0 %
Eosinophils Absolute: 0 10*3/uL (ref 0.0–0.5)
Eosinophils Relative: 0 %
HCT: 41.2 % (ref 36.0–46.0)
Hemoglobin: 13.5 g/dL (ref 12.0–15.0)
Immature Granulocytes: 1 %
Lymphocytes Relative: 26 %
Lymphs Abs: 4.5 10*3/uL — ABNORMAL HIGH (ref 0.7–4.0)
MCH: 27.8 pg (ref 26.0–34.0)
MCHC: 32.8 g/dL (ref 30.0–36.0)
MCV: 84.9 fL (ref 80.0–100.0)
Monocytes Absolute: 0.4 10*3/uL (ref 0.1–1.0)
Monocytes Relative: 3 %
Neutro Abs: 11.9 10*3/uL — ABNORMAL HIGH (ref 1.7–7.7)
Neutrophils Relative %: 70 %
Platelets: 383 10*3/uL (ref 150–400)
RBC: 4.85 MIL/uL (ref 3.87–5.11)
RDW: 13.7 % (ref 11.5–15.5)
WBC: 17 10*3/uL — ABNORMAL HIGH (ref 4.0–10.5)
nRBC: 0 % (ref 0.0–0.2)

## 2019-03-26 LAB — FIBRIN DERIVATIVES D-DIMER (ARMC ONLY): Fibrin derivatives D-dimer (ARMC): 5980.86 ng/mL (FEU) — ABNORMAL HIGH (ref 0.00–499.00)

## 2019-03-26 LAB — TROPONIN I
Troponin I: <0.03 ng/mL (ref <0.03)
Troponin I: 0.03 ng/mL (ref <0.03)

## 2019-03-26 MED ORDER — DIPHENHYDRAMINE HCL 50 MG/ML IJ SOLN
12.5000 mg | Freq: Once | INTRAMUSCULAR | Status: AC
  Administered 2019-03-26: 12.5 mg via INTRAVENOUS
  Filled 2019-03-26: qty 1

## 2019-03-26 MED ORDER — ASPIRIN 81 MG PO CHEW
324.0000 mg | CHEWABLE_TABLET | Freq: Once | ORAL | Status: AC
  Administered 2019-03-26: 324 mg via ORAL
  Filled 2019-03-26: qty 4

## 2019-03-26 MED ORDER — SODIUM CHLORIDE 0.9 % IV BOLUS
500.0000 mL | Freq: Once | INTRAVENOUS | Status: AC
  Administered 2019-03-26: 500 mL via INTRAVENOUS

## 2019-03-26 MED ORDER — PROCHLORPERAZINE EDISYLATE 10 MG/2ML IJ SOLN
10.0000 mg | Freq: Once | INTRAMUSCULAR | Status: AC
  Administered 2019-03-26: 10 mg via INTRAVENOUS
  Filled 2019-03-26: qty 2

## 2019-03-26 MED ORDER — METHYLPREDNISOLONE SODIUM SUCC 125 MG IJ SOLR
80.0000 mg | Freq: Once | INTRAMUSCULAR | Status: AC
  Administered 2019-03-26: 80 mg via INTRAVENOUS
  Filled 2019-03-26: qty 2

## 2019-03-26 MED ORDER — IOHEXOL 350 MG/ML SOLN
75.0000 mL | Freq: Once | INTRAVENOUS | Status: AC | PRN
  Administered 2019-03-26: 75 mL via INTRAVENOUS

## 2019-03-26 MED ORDER — ONDANSETRON HCL 4 MG/2ML IJ SOLN
INTRAMUSCULAR | Status: AC
  Filled 2019-03-26: qty 2

## 2019-03-26 NOTE — ED Notes (Signed)
Dr Quentin Cornwall aware of troponin 0.03

## 2019-03-26 NOTE — ED Triage Notes (Addendum)
Has been having hives for week. Thought was from prednisone so stopped it 5 days ago. No improvement in rash and today was having SHOB, chest tightness, and feeling like throat closing. On for bronchitis. Has dry cough. Recently had oral surgery.  Pt does report she started with cough yesterday and was not dx with bronchitis by MD but has had before and feels same.  25 mg benadryl PTA 20 mg pepcid PTA 0.5 mg EPI given PTA 1:1000

## 2019-03-26 NOTE — ED Notes (Signed)
Patient transported to Ultrasound 

## 2019-03-26 NOTE — ED Provider Notes (Signed)
[facility] Emergency Department Provider Note    First MD Initiated Contact with Patient 03/26/19 [phone]     (approximate)  I have reviewed the triage vital signs and the nursing notes.   HISTORY  Chief Complaint Allergic Reaction    HPI Nichole Riddle is a 58 y.o. female plosive past medical history presents the ER for 1 week of hives and urticaria with 24 hours of shortness of breath and chest palpitations or discomfort.  States this morning she felt her throat was closing up.  Thinks it is related to either prednisone or erythromycin that she was given on this past week after oral surgery.  During her interview patient also started complaining of severe headache.  Not the worst headache of her life.  States it is primarily the back of her head.  States that she was having shortness of breath with it.  Started having sinus tachycardia on the monitor which resolved.    Past Medical History:  Diagnosis Date   Anxiety 1997   Bulimia 1997   per psychology note in chart   Depression 1997   Gastric ulcer 2012   NSAID induced    Gastric ulcer due to Helicobacter pylori 6767   and NSAID use   History of cardiac catheterization 2012   normal    HLD (hyperlipidemia)    IBS (irritable bowel syndrome) 06/17/2015   Irritable bowel    with alternating constipation/bloating and diarrhea   Migraines    Multiple gastric ulcers    esophagus   Family History  Problem Relation Age of Onset   Heart disease Father 81       died of massive mi   Heart disease Brother 59       has had 4 MIs, cardiomyopathy   Celiac disease Brother    Esophageal cancer Maternal Grandmother    Stomach cancer Maternal Grandmother    Irritable bowel syndrome Mother    Ulcerative colitis Mother    Breast cancer Neg Hx    Past Surgical History:  Procedure Laterality Date   ABDOMINAL HYSTERECTOMY     COLONOSCOPY     ESOPHAGOGASTRODUODENOSCOPY     Patient  Active Problem List   Diagnosis Date Noted   Chronic paronychia of toe, left 02/23/2018   Myalgia due to statin 02/23/2018   Vitamin D deficiency 02/23/2018   Chronic left shoulder pain 02/23/2018   H/O abdominal supracervical subtotal hysterectomy 02/22/2018   Cervicalgia of occipito-atlanto-axial region 09/20/2017   Chronic bilateral low back pain with right-sided sciatica 06/15/2017   Fracture of thumb, right, closed 05/10/2016   Visit for preventive health examination 12/27/2015   IBS (irritable bowel syndrome) 06/17/2015   Insomnia 06/12/2015   Rectal bleeding 02/23/2015   Adaptive colitis 02/19/2015   Cluster headache, episodic 10/09/2014   Elevation of level of transaminase or lactic acid dehydrogenase (LDH) 05/15/2014   Statin intolerance 12/02/2013   Allergy to substance 12/02/2013   Encounter for preventive health examination 08/05/2013   Arthralgia of multiple joints 03/11/2013   Anxiety and depression 10/27/2012   Hyperlipidemia with target LDL less than 130 02/20/2012   Menopause 10/02/2011   Obesity 10/02/2011   Headache, migraine 09/29/2011   History of colonoscopy 09/29/2011   History of gastric ulcer    History of cardiac catheterization       Prior to Admission medications   Medication Sig Start Date End Date Taking? Authorizing Provider  albuterol (PROVENTIL HFA;VENTOLIN HFA) 108 (90 Base) MCG/ACT inhaler Inhale  2 puffs into the lungs every 6 (six) hours as needed for wheezing or shortness of breath. 04/03/18   Crecencio Mc, MD  ALPRAZolam Duanne Moron) 0.5 MG tablet Take 1 tablet (0.5 mg total) by mouth 2 (two) times daily as needed for anxiety. 04/20/18   Crecencio Mc, MD  butalbital-acetaminophen-caffeine (FIORICET, ESGIC) [phone] MG tablet Take 1-2 tablets by mouth every 6 (six) hours as needed for headache. 06/11/18 06/11/19  Alfred Levins, [place], MD  cyanocobalamin (,VITAMIN B-12,) 1000 MCG/ML injection Inject 1 mL (1,000 mcg total)  into the muscle once a week. 09/19/17   Crecencio Mc, MD  ezetimibe (ZETIA) 10 MG tablet Take 1 tablet (10 mg total) by mouth daily. Patient not taking: Reported on 06/11/2018 02/28/18   Crecencio Mc, MD  famotidine (PEPCID) 40 MG tablet Take 1 tablet (40 mg total) by mouth every evening. Patient not taking: Reported on 06/11/2018 04/19/18 04/19/19  Nance Pear, MD  oxyCODONE (ROXICODONE) 5 MG immediate release tablet Take 1 tablet (5 mg total) by mouth every 8 (eight) hours as needed. 01/21/19 01/21/20  Duanne Guess, PA-C  Polyvinyl Alcohol-Povidone (REFRESH OP) Apply 1 drop to eye as needed (Dry eyes).    [provider]  predniSONE (DELTASONE) 10 MG tablet 10 day taper. 5,5,4,4,3,3,2,2,1,1 01/21/19   Duanne Guess, PA-C  Probiotic Product (ALIGN) 4 MG CAPS Take 4 mg by mouth daily.    [provider]  promethazine (PHENERGAN) 25 MG tablet Take 1 tablet (25 mg total) by mouth every 8 (eight) hours as needed for nausea or vomiting. 05/10/16   Crecencio Mc, MD  Syringe/Needle, Disp, (SYRINGE 3CC/25GX1") 25G X 1" 3 ML MISC Use for b12 injections 01/27/17   Crecencio Mc, MD  Syringe/Needle, Disp, (SYRINGE 3CC/25GX1") 25G X 1" 3 ML MISC Use for b12 injections 09/19/17   Crecencio Mc, MD  venlafaxine XR (EFFEXOR-XR) 150 MG 24 hr capsule Take 150 mg by mouth daily with breakfast.    [provider]  venlafaxine XR (EFFEXOR-XR) 37.5 MG 24 hr capsule TAKE 2 CAPSULES EVERY DAY WITH BREAKFAST Patient not taking: Reported on 06/11/2018 02/27/18   Crecencio Mc, MD  zolmitriptan (ZOMIG-ZMT) 5 MG disintegrating tablet Take 5 mg by mouth as needed for migraine.  02/20/18   [provider]  estradiol (ESTRACE) 0.5 MG tablet Take 1 tablet (0.5 mg total) by mouth daily. 09/29/11 02/17/12  Crecencio Mc, MD    Allergies Nitroglycerin; Pravastatin; Bactrim [sulfamethoxazole-trimethoprim]; Codeine sulfate; Lipitor [atorvastatin]; Prednisone; Topamax [topiramate];  Vicodin [hydrocodone-acetaminophen]; and Nortriptyline    Social History Social History   Tobacco Use   Smoking status: Never Smoker   Smokeless tobacco: Never Used  Substance Use Topics   Alcohol use: Yes    Alcohol/week: 2.0 standard drinks    Types: 2 Cans of beer per week    Comment: occasional-beer   Drug use: No    Review of Systems Patient denies headaches, rhinorrhea, blurry vision, numbness, shortness of breath, chest pain, edema, cough, abdominal pain, nausea, vomiting, diarrhea, dysuria, fevers, rashes or hallucinations unless otherwise stated above in HPI. ____________________________________________   PHYSICAL EXAM:  VITAL SIGNS: Vitals:   03/26/19 1100 03/26/19 1130  BP: 98/67 119/73  Pulse: 89 (!) 115  Resp: 17 (!) 22  Temp:    SpO2: 94% 95%    Constitutional: Alert and oriented.  Eyes: Conjunctivae are normal.  Head: Atraumatic. Nose: No congestion/rhinnorhea. Mouth/Throat: Mucous membranes are moist.   Neck: No stridor. Painless  ROM.  Cardiovascular: Normal rate, regular rhythm. Grossly normal heart sounds.  Good peripheral circulation. Respiratory: Normal respiratory effort.  No retractions. No wheeze or rhonchi. Gastrointestinal: Soft and nontender. No distention. No abdominal bruits. No CVA tenderness. Genitourinary: deferred Musculoskeletal: No lower extremity tenderness nor edema.  No joint effusions. Neurologic:  Normal speech and language. No gross focal neurologic deficits are appreciated. No facial droop Skin:  Skin is warm, dry and intact. Scattered urticaria to trunk and BLE Psychiatric: Mood and affect are anxious. Speech and behavior are normal.  ____________________________________________   LABS (all labs ordered are listed, but only abnormal results are displayed)  Results for orders placed or performed during the hospital encounter of 03/26/19 (from the past 24 hour(s))  CBC with Differential/Platelet     Status: Abnormal     Collection Time: 03/26/19  7:33 AM  Result Value Ref Range   WBC 17.0 (H) 4.0 - 10.5 K/uL   RBC 4.85 3.87 - 5.11 MIL/uL   Hemoglobin 13.5 12.0 - 15.0 g/dL   HCT 41.2 36.0 - 46.0 %   MCV 84.9 80.0 - 100.0 fL   MCH 27.8 26.0 - 34.0 pg   MCHC 32.8 30.0 - 36.0 g/dL   RDW 13.7 11.5 - 15.5 %   Platelets 383 150 - 400 K/uL   nRBC 0.0 0.0 - 0.2 %   Neutrophils Relative % 70 %   Neutro Abs 11.9 (H) 1.7 - 7.7 K/uL   Lymphocytes Relative 26 %   Lymphs Abs 4.5 (H) 0.7 - 4.0 K/uL   Monocytes Relative 3 %   Monocytes Absolute 0.4 0.1 - 1.0 K/uL   Eosinophils Relative 0 %   Eosinophils Absolute 0.0 0.0 - 0.5 K/uL   Basophils Relative 0 %   Basophils Absolute 0.0 0.0 - 0.1 K/uL   Immature Granulocytes 1 %   Abs Immature Granulocytes 0.13 (H) 0.00 - 0.07 K/uL  Comprehensive metabolic panel     Status: Abnormal   Collection Time: 03/26/19  7:33 AM  Result Value Ref Range   Sodium 137 135 - 145 mmol/L   Potassium 3.5 3.5 - 5.1 mmol/L   Chloride 105 98 - 111 mmol/L   CO2 21 (L) 22 - 32 mmol/L   Glucose, Bld 127 (H) 70 - 99 mg/dL   BUN 10 6 - 20 mg/dL   Creatinine, Ser 0.93 0.44 - 1.00 mg/dL   Calcium 8.6 (L) 8.9 - 10.3 mg/dL   Total Protein 6.7 6.5 - 8.1 g/dL   Albumin 3.5 3.5 - 5.0 g/dL   AST 25 15 - 41 U/L   ALT 26 0 - 44 U/L   Alkaline Phosphatase 71 38 - 126 U/L   Total Bilirubin 0.8 0.3 - 1.2 mg/dL   GFR calc non Af Amer >60 >60 mL/min   GFR calc Af Amer >60 >60 mL/min   Anion gap 11 5 - 15  Troponin I - ONCE - STAT     Status: None   Collection Time: 03/26/19  7:33 AM  Result Value Ref Range   Troponin I <0.03 <0.03 ng/mL  Fibrin derivatives D-Dimer ([facility] only)     Status: Abnormal   Collection Time: 03/26/19  7:50 AM  Result Value Ref Range   Fibrin derivatives D-dimer ([facility]) 5,980.86 (H) 0.00 - 499.00 ng/mL (FEU)  Troponin I - Once-Timed     Status: Abnormal   Collection Time: 03/26/19 10:28 AM  Result Value Ref Range   Troponin I 0.03 (HH) <0.03 ng/mL  ____________________________________________  EKG My review and personal interpretation at Time: 7:12   Indication:  sob  Rate: 95  Rhythm: sinus Axis: normal Other: normal intervals, no stemi, nonspeicifc st abn ____________________________________________  RADIOLOGY  I personally reviewed all radiographic images ordered to evaluate for the above acute complaints and reviewed radiology reports and findings.  These findings were personally discussed with the patient.  Please see medical record for radiology report.  ____________________________________________   PROCEDURES  Procedure(s) performed:  Procedures    Critical Care performed: no ____________________________________________   INITIAL IMPRESSION / ASSESSMENT AND PLAN / ED COURSE  Pertinent labs & imaging results that were available during my care of the patient were reviewed by me and considered in my medical decision making (see chart for details).   DDX: ACS, pericarditis, esophagitis, boerhaaves, pe, dissection, pna, bronchitis, costochondritis   Nichole Riddle is a 58 y.o. who presents to the ED with symptoms as described above.  Patient arrives for symptoms concerning for allergic reaction.  Also complaint of shortness of breath and some chest discomfort.  Initial EKG shows no evidence of ischemic changes as compared to previous.  Will send blood work for the blood differential.  Will order d-dimer she is low risk by Wells criteria.  Patient also started developing what appeared to be muscle spasm during my evaluation.  Did have sinus tachycardia on the monitor which resolved after she took some slow deep breaths.  She remained without any respiratory distress or hypoxia.  Abdominal exam soft and benign.  Clinical Course as of Mar 26 1155  Mon Mar 26, 2019  0741 Patient reassessed.  No longer having a headache.  States that she was having back pain shooting up from the bottom up into her neck lasted quite  briefly.  Similar to muscle spasm.  No worsening respiratory status.  Remains hemodynamically stable.  No tachycardia.   [PR]  0800 Patient reassessed.  Feels much improved after antiemetic and Benadryl.  Not consistent with anaphylaxis.  Will observe in the ER for serial enzymes.  Still awaiting result of d-dimer for possible need of CT angiogram.   [PR]  3559 Vital signs stable.  Documentation shows respiratory rate of 29 however went to check on patient she has normal respirations clear lung sounds and no hypoxia.   [PR]  [id number] D-dimer is elevated.  Given her constellation of symptoms will order CT angiogram to further evaluate for PE.   [PR]  1035 US Venous Img Lower Bilateral [AW]  1125 Imaging is reassuring.  We discussed the pulmonary nodule which will need follow-up with there is no evidence of PE or pneumonia.  Elevated d-dimer likely secondary to recent surgery.  Repeat troponin is and upper limit of normal at 0.03.  She is pain-free and states that she "feels great ".  She does follow with Dr. Yancey Flemings of cardiology.  I doubt ACS but will touch base with him regarding further recommendations.   [PR]  7416 Gust case in consultation with Dr. Chancy Milroy of cardiology.  Reassessment patient states that she feels well and would like to be discharged home.  Plan will be for follow-up with cardiology first thing in the morning at 9 AM.  Dr. Yancey Flemings as requested aspirin.  Would consider nitro but the patient does have an allergy to this and as she is pain-free I think that close outpatient follow-up is reasonable.  We discussed alternatives including admission the hospital for further medical management versus further observation in the ER for  serial enzymes but patient states that she feels well.  Have discussed with the patient and available family all diagnostics and treatments performed thus far and all questions were answered to the best of my ability. The patient demonstrates understanding and agreement with  plan.    [PR]    Clinical Course User Index [AW] Laban Emperor, PA-C [PR] Merlyn Lot, MD    The patient was evaluated in Emergency Department today for the symptoms described in the history of present illness. He/she was evaluated in the context of the global COVID-19 pandemic, which necessitated consideration that the patient might be at risk for infection with the SARS-CoV-2 virus that causes COVID-19. Institutional protocols and algorithms that pertain to the evaluation of patients at risk for COVID-19 are in a state of rapid change based on information released by regulatory bodies including the CDC and federal and state organizations. These policies and algorithms were followed during the patient's care in the ED.  As part of my medical decision making, I reviewed the following data within the [facility] notes reviewed and incorporated, Labs reviewed, notes from prior ED visits and [place] Controlled Substance Database   ____________________________________________   FINAL CLINICAL IMPRESSION(S) / ED DIAGNOSES  Final diagnoses:  Lung nodule seen on imaging study  Urticaria  Chest pain, unspecified type      NEW MEDICATIONS STARTED DURING THIS VISIT:  New Prescriptions   No medications on file     Note:  This document was prepared using Dragon voice recognition software and may include unintentional dictation errors.    Merlyn Lot, MD 03/26/19 1157

## 2019-03-26 NOTE — ED Notes (Signed)
ambulated to restroom

## 2019-07-27 NOTE — Telephone Encounter (Signed)
err

## 2020-01-21 DIAGNOSIS — D8940 Mast cell activation, unspecified: Secondary | ICD-10-CM | POA: Insufficient documentation

## 2021-01-19 ENCOUNTER — Encounter (INDEPENDENT_AMBULATORY_CARE_PROVIDER_SITE_OTHER): Payer: BLUE CROSS/BLUE SHIELD | Admitting: Vascular Surgery

## 2021-02-04 ENCOUNTER — Telehealth (INDEPENDENT_AMBULATORY_CARE_PROVIDER_SITE_OTHER): Payer: Self-pay | Admitting: Vascular Surgery

## 2021-02-04 NOTE — Telephone Encounter (Signed)
Patient left VM to reschedule missed appointment with Dr. Delana Meyer.  Left VM.  AP

## 2021-02-18 DIAGNOSIS — I890 Lymphedema, not elsewhere classified: Secondary | ICD-10-CM | POA: Insufficient documentation

## 2021-02-18 DIAGNOSIS — I872 Venous insufficiency (chronic) (peripheral): Secondary | ICD-10-CM | POA: Insufficient documentation

## 2021-02-18 NOTE — Progress Notes (Deleted)
MRN : [id number]  Nichole Riddle is a 60 y.o. ([birth year]) female who presents with chief complaint of No chief complaint on file. [name]  History of Present Illness:   Patient is seen for evaluation of leg pain and leg swelling. The patient first noticed the swelling remotely. The swelling is associated with pain and discoloration. The pain and swelling worsens with prolonged dependency and improves with elevation. The pain is unrelated to activity.  The patient notes that in the morning the legs are significantly improved but they steadily worsened throughout the course of the day. The patient also notes a steady worsening of the discoloration in the ankle and shin area.   The patient denies claudication symptoms.  The patient denies symptoms consistent with rest pain.  The patient denies and extensive history of DJD and LS spine disease.  The patient has no had any past angiography, interventions or vascular surgery.  Elevation makes the leg symptoms better, dependency makes them much worse. There is no history of ulcerations. The patient denies any recent changes in medications.  The patient has not been wearing graduated compression.  The patient denies a history of DVT or PE. There is no prior history of phlebitis. There is no history of primary lymphedema.  No history of malignancies. No history of trauma or groin or pelvic surgery. There is no history of radiation treatment to the groin or pelvis  The patient denies amaurosis fugax or recent TIA symptoms. There are no recent neurological changes noted. The patient denies recent episodes of angina or shortness of breath  No outpatient medications have been marked as taking for the 02/19/21 encounter (Appointment) with Delana Meyer, Dolores Lory, MD.    Past Medical History:  Diagnosis Date  . Anxiety 1997  . Bulimia 1997   per psychology note in chart  . Depression 1997  . Gastric ulcer 2012   NSAID induced   . Gastric ulcer due to  Helicobacter pylori 5400   and NSAID use  . History of cardiac catheterization 2012   normal   . HLD (hyperlipidemia)   . IBS (irritable bowel syndrome) 06/17/2015  . Irritable bowel    with alternating constipation/bloating and diarrhea  . Migraines   . Multiple gastric ulcers    esophagus    Past Surgical History:  Procedure Laterality Date  . ABDOMINAL HYSTERECTOMY    . COLONOSCOPY    . ESOPHAGOGASTRODUODENOSCOPY      Social History Social History   Tobacco Use  . Smoking status: Never Smoker  . Smokeless tobacco: Never Used  Substance Use Topics  . Alcohol use: Yes    Alcohol/week: 2.0 standard drinks    Types: 2 Cans of beer per week    Comment: occasional-beer  . Drug use: No    Family History Family History  Problem Relation Age of Onset  . Heart disease Father 25       died of massive mi  . Heart disease Brother 47       has had 4 MIs, cardiomyopathy  . Celiac disease Brother   . Esophageal cancer Maternal Grandmother   . Stomach cancer Maternal Grandmother   . Irritable bowel syndrome Mother   . Ulcerative colitis Mother   . Breast cancer Neg Hx   No family history of bleeding/clotting disorders, porphyria or autoimmune disease   Allergies  Allergen Reactions  . Nitroglycerin Other (See Comments)    Patient reported having seizure with second dose.  . Pravastatin Other (See  Comments)    myalgias  . Bactrim [Sulfamethoxazole-Trimethoprim] Nausea Only  . Codeine Sulfate     Other reaction(s): Unknown  . Lipitor [Atorvastatin] Other (See Comments)    Myalgias and flu like symptoms  . Prednisone Nausea And Vomiting    Abdominal cramping  . Topamax [Topiramate]   . Vicodin [Hydrocodone-Acetaminophen] Nausea And Vomiting  . Nortriptyline Palpitations     REVIEW OF SYSTEMS (Negative unless checked)  Constitutional: [] Weight loss  [] Fever  [] Chills Cardiac: [] Chest pain   [] Chest pressure   [] Palpitations   [] Shortness of breath when laying flat    [] Shortness of breath with exertion. Vascular:  [] Pain in legs with walking   [] Pain in legs at rest  [] History of DVT   [] Phlebitis   [x] Swelling in legs   [] Varicose veins   [] Non-healing ulcers Pulmonary:   [] Uses home oxygen   [] Productive cough   [] Hemoptysis   [] Wheeze  [] COPD   [] Asthma Neurologic:  [] Dizziness   [] Seizures   [] History of stroke   [] History of TIA  [] Aphasia   [] Vissual changes   [] Weakness or numbness in arm   [] Weakness or numbness in leg Musculoskeletal:   [] Joint swelling   [] Joint pain   [] Low back pain Hematologic:  [] Easy bruising  [] Easy bleeding   [] Hypercoagulable state   [] Anemic Gastrointestinal:  [] Diarrhea   [] Vomiting  [] Gastroesophageal reflux/heartburn   [] Difficulty swallowing. Genitourinary:  [] Chronic kidney disease   [] Difficult urination  [] Frequent urination   [] Blood in urine Skin:  [] Rashes   [] Ulcers  Psychological:  [] History of anxiety   []  History of major depression.  Physical Examination  There were no vitals filed for this visit. There is no height or weight on file to calculate BMI. Gen: WD/WN, NAD Head: [place]/AT, No temporalis wasting.  Ear/Nose/Throat: Hearing grossly intact, nares w/o erythema or drainage, poor dentition Eyes: PER, EOMI, sclera nonicteric.  Neck: Supple, no masses.  No bruit or JVD.  Pulmonary:  Good air movement, clear to auscultation bilaterally, no use of accessory muscles.  Cardiac: RRR, normal S1, S2, no Murmurs. Vascular: scattered varicosities present bilaterally.  Mild venous stasis changes to the legs bilaterally.  2+ soft pitting edema Vessel Right Left  Radial Palpable Palpable  PT Palpable Palpable  DP Palpable Palpable  Gastrointestinal: soft, non-distended. No guarding/no peritoneal signs.  Musculoskeletal: M/S 5/5 throughout.  No deformity or atrophy.  Neurologic: CN 2-12 intact. Pain and light touch intact in extremities.  Symmetrical.  Speech is fluent. Motor exam as listed above. Psychiatric:  Judgment intact, Mood & affect appropriate for pt's clinical situation. Dermatologic: No rashes or ulcers noted.  No changes consistent with cellulitis. Lymph : No Cervical lymphadenopathy, no lichenification or skin changes of chronic lymphedema.  CBC Lab Results  Component Value Date   WBC 17.0 (H) 03/26/2019   HGB 13.5 03/26/2019   HCT 41.2 03/26/2019   MCV 84.9 03/26/2019   PLT 383 03/26/2019    BMET    Component Value Date/Time   NA 137 03/26/2019 0733   NA 139 12/06/2012 1249   K 3.5 03/26/2019 0733   K 3.7 12/06/2012 1249   CL 105 03/26/2019 0733   CL 105 12/06/2012 1249   CO2 21 (L) 03/26/2019 0733   CO2 20 (L) 12/06/2012 1249   GLUCOSE 127 (H) 03/26/2019 0733   GLUCOSE 118 (H) 12/06/2012 1249   BUN 10 03/26/2019 0733   BUN 10 12/06/2012 1249   CREATININE 0.93 03/26/2019 0733   CREATININE 0.87 03/09/2013  1526   CALCIUM 8.6 (L) 03/26/2019 0733   CALCIUM 9.3 12/06/2012 1249   GFRNONAA >60 03/26/2019 0733   GFRNONAA >60 12/06/2012 1249   GFRAA >60 03/26/2019 0733   GFRAA >60 12/06/2012 1249   CrCl cannot be calculated (Patient's most recent lab result is older than the maximum 21 days allowed.).  COAG Lab Results  Component Value Date   INR 0.92 06/11/2018   INR 1.0 11/08/2014    Radiology No results found.   Assessment/Plan There are no diagnoses linked to this encounter.   Hortencia Pilar, MD  02/18/2021 1:42 PM

## 2021-02-19 ENCOUNTER — Encounter (INDEPENDENT_AMBULATORY_CARE_PROVIDER_SITE_OTHER): Payer: BLUE CROSS/BLUE SHIELD | Admitting: Vascular Surgery

## 2021-02-19 DIAGNOSIS — I890 Lymphedema, not elsewhere classified: Secondary | ICD-10-CM

## 2021-02-19 DIAGNOSIS — I872 Venous insufficiency (chronic) (peripheral): Secondary | ICD-10-CM

## 2021-02-19 DIAGNOSIS — E785 Hyperlipidemia, unspecified: Secondary | ICD-10-CM

## 2021-03-09 ENCOUNTER — Encounter (INDEPENDENT_AMBULATORY_CARE_PROVIDER_SITE_OTHER): Payer: BLUE CROSS/BLUE SHIELD | Admitting: Vascular Surgery

## 2021-03-19 ENCOUNTER — Other Ambulatory Visit: Payer: Self-pay

## 2021-03-19 ENCOUNTER — Ambulatory Visit (INDEPENDENT_AMBULATORY_CARE_PROVIDER_SITE_OTHER): Payer: BLUE CROSS/BLUE SHIELD | Admitting: Nurse Practitioner

## 2021-03-19 VITALS — BP 120/80 | HR 75 | Ht 63.0 in | Wt 172.0 lb

## 2021-03-19 DIAGNOSIS — M7989 Other specified soft tissue disorders: Secondary | ICD-10-CM | POA: Diagnosis not present

## 2021-03-19 DIAGNOSIS — D8940 Mast cell activation, unspecified: Secondary | ICD-10-CM | POA: Diagnosis not present

## 2021-03-22 ENCOUNTER — Encounter (INDEPENDENT_AMBULATORY_CARE_PROVIDER_SITE_OTHER): Payer: Self-pay | Admitting: Nurse Practitioner

## 2021-03-22 NOTE — Progress Notes (Signed)
Subjective:    Patient ID: Nichole Riddle, female    DOB: 07-12-1961, 60 y.o.   MRN: [id number] Chief Complaint  Patient presents with  . New Patient (Initial Visit)    [place] ble edema     Patient is seen for evaluation of leg swelling. The patient first noticed the swelling remotely but is now concerned because of a significant increase in the overall edema. The swelling is associated with pain and discoloration. The patient notes that in the morning the legs are significantly improved but they steadily worsened throughout the course of the day. Elevation makes the legs better, dependency makes them much worse.   There is no history of ulcerations associated with the swelling.   The patient denies any recent changes in their medications.  The patient has not been wearing graduated compression.  The patient has no had any past angiography, interventions or vascular surgery.  The patient denies a history of DVT or PE. There is no prior history of phlebitis. There is no history of primary lymphedema.  There is no history of radiation treatment to the groin or pelvis No history of malignancies. No history of trauma or groin or pelvic surgery. No history of foreign travel or parasitic infections area    Review of Systems  Cardiovascular: Positive for leg swelling.  All other systems reviewed and are negative.      Objective:   Physical Exam Vitals reviewed.  HENT:     Head: Normocephalic.  Cardiovascular:     Rate and Rhythm: Normal rate.     Pulses: Normal pulses.  Pulmonary:     Effort: Pulmonary effort is normal.  Musculoskeletal:     Right lower leg: Edema present.     Left lower leg: Edema present.  Neurological:     Mental Status: She is alert and oriented to person, place, and time.  Psychiatric:        Mood and Affect: Mood normal.        Behavior: Behavior normal.        Thought Content: Thought content normal.        Judgment: Judgment normal.     BP  120/80   Pulse 75   Ht 5\' 3"  (1.6 m)   Wt 172 lb (78 kg)   BMI 30.47 kg/m   Past Medical History:  Diagnosis Date  . Anxiety 1997  . Bulimia 1997   per psychology note in chart  . Depression 1997  . Gastric ulcer 2012   NSAID induced   . Gastric ulcer due to Helicobacter pylori 4008   and NSAID use  . History of cardiac catheterization 2012   normal   . HLD (hyperlipidemia)   . IBS (irritable bowel syndrome) 06/17/2015  . Irritable bowel    with alternating constipation/bloating and diarrhea  . Migraines   . Multiple gastric ulcers    esophagus    Social History   Socioeconomic History  . Marital status: Divorced    Spouse name: Not on file  . Number of children: 0  . Years of education: Not on file  . Highest education level: Not on file  Occupational History  . 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 Smoking status: Never Smoker  . Smokeless tobacco: Never Used  Substance and Sexual Activity  . Alcohol use: Yes    Alcohol/week: 2.0 standard drinks    Types: 2 Cans of beer per  week    Comment: occasional-beer  . Drug use: No  . Sexual activity: Yes  Other Topics Concern  . Not on file  Social History Narrative   She is engaged to be married,   Her fiancee has a 50 yr old son and a a bitter ex wife.     She resigned from [employer] due to the stress level, and has been  self employed as a Art therapist.  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    Social Determinants of Health   Financial Resource Strain: Not on file  Food Insecurity: Not on file  Transportation Needs: Not on file  Physical Activity: Not on file  Stress: Not on file  Social Connections: Not on file  Intimate Partner Violence: Not on file    Past Surgical History:  Procedure Laterality Date  . ABDOMINAL HYSTERECTOMY    . COLONOSCOPY    . ESOPHAGOGASTRODUODENOSCOPY      Family History  Problem Relation  Age of Onset  . Heart disease Father 30       died of massive mi  . Heart disease Brother 46       has had 4 MIs, cardiomyopathy  . Celiac disease Brother   . Esophageal cancer Maternal Grandmother   . Stomach cancer Maternal Grandmother   . Irritable bowel syndrome Mother   . Ulcerative colitis Mother   . Breast cancer Neg Hx     Allergies  Allergen Reactions  . Nitroglycerin Other (See Comments)    Patient reported having seizure with second dose.  . Pravastatin Other (See Comments)    myalgias  . Bactrim [Sulfamethoxazole-Trimethoprim] Nausea Only  . Codeine Sulfate     Other reaction(s): Unknown  . Lipitor [Atorvastatin] Other (See Comments)    Myalgias and flu like symptoms  . Prednisone Nausea And Vomiting    Abdominal cramping  . Topamax [Topiramate]   . Vicodin [Hydrocodone-Acetaminophen] Nausea And Vomiting  . Nortriptyline Palpitations    CBC Latest Ref Rng & Units 03/26/2019 06/11/2018 04/19/2018  WBC 4.0 - 10.5 K/uL 17.0(H) 7.2 5.1  Hemoglobin 12.0 - 15.0 g/dL 13.5 13.5 12.9  Hematocrit 36.0 - 46.0 % 41.2 39.9 37.9  Platelets 150 - 400 K/uL 383 282 280      CMP     Component Value Date/Time   NA 137 03/26/2019 0733   NA 139 12/06/2012 1249   K 3.5 03/26/2019 0733   K 3.7 12/06/2012 1249   CL 105 03/26/2019 0733   CL 105 12/06/2012 1249   CO2 21 (L) 03/26/2019 0733   CO2 20 (L) 12/06/2012 1249   GLUCOSE 127 (H) 03/26/2019 0733   GLUCOSE 118 (H) 12/06/2012 1249   BUN 10 03/26/2019 0733   BUN 10 12/06/2012 1249   CREATININE 0.93 03/26/2019 0733   CREATININE 0.87 03/09/2013 1526   CALCIUM 8.6 (L) 03/26/2019 0733   CALCIUM 9.3 12/06/2012 1249   PROT 6.7 03/26/2019 0733   PROT 8.1 12/06/2012 1249   ALBUMIN 3.5 03/26/2019 0733   ALBUMIN 4.3 12/06/2012 1249   AST 25 03/26/2019 0733   AST 25 12/06/2012 1249   ALT 26 03/26/2019 0733   ALT 25 12/06/2012 1249   ALKPHOS 71 03/26/2019 0733   ALKPHOS 97 12/06/2012 1249   BILITOT 0.8 03/26/2019 0733    BILITOT 0.3 12/06/2012 1249   GFRNONAA >60 03/26/2019 0733   GFRNONAA >60 12/06/2012 1249   GFRAA >60 03/26/2019  [place] >60 12/06/2012 1249     No results found.     Assessment & Plan:   1. 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  2. Leg swelling I have had a long discussion with the patient regarding swelling and why it  causes symptoms.  Patient will begin wearing graduated compression stockings class 1 (20-30 mmHg) on a daily basis a prescription was given. The patient will  beginning wearing the stockings first thing in the morning and removing them in the evening. The patient is instructed specifically not to sleep in the stockings.   In addition, behavioral modification will be initiated.  This will include frequent elevation, use of over the counter pain medications and exercise such as walking.  I have reviewed systemic causes for chronic edema such as liver, kidney and cardiac etiologies.  The patient denies problems with these organ systems.    Patient should undergo duplex ultrasound of the venous system to ensure that DVT or reflux is not present.  The patient will follow-up with me after the ultrasound.     Current Outpatient Medications on File Prior to Visit  Medication Sig Dispense Refill  . albuterol (PROVENTIL HFA;VENTOLIN HFA) 108 (90 Base) MCG/ACT inhaler Inhale 2 puffs into the lungs every 6 (six) hours as needed for wheezing or shortness of breath. 1 Inhaler 2  . ALPRAZolam (XANAX) 0.5 MG tablet Take 1 tablet (0.5 mg total) by mouth 2 (two) times daily as needed for anxiety. 60 tablet 5  . azelastine (ASTELIN) 0.1 % nasal spray 2 sprays by Each Nare route Two (2) times a day.    . budesonide-formoterol (SYMBICORT) 80-4.5 MCG/ACT inhaler Inhale into the lungs.    . cetirizine (ZYRTEC) 10 MG tablet Take by mouth.    . citalopram (CELEXA) 20 MG tablet Take 20 mg by  mouth at bedtime.    . cromolyn (GASTROCROM) 100 MG/5ML solution Take by mouth.    . cyanocobalamin (,VITAMIN B-12,) 1000 MCG/ML injection Inject 1 mL (1,000 mcg total) into the muscle once a week. 10 mL 2  . EPINEPHrine 0.3 mg/0.3 mL IJ SOAJ injection Inject into the muscle.    . ezetimibe (ZETIA) 10 MG tablet Take 1 tablet (10 mg total) by mouth daily. 30 tablet 5  . famotidine (PEPCID) 20 MG tablet Take 20 mg by mouth 2 (two) times daily as needed.    . gabapentin (NEURONTIN) 100 MG capsule Take by mouth.    . Galcanezumab-gnlm 120 MG/ML SOAJ Inject into the skin.    . Polyvinyl Alcohol-Povidone (REFRESH OP) Apply 1 drop to eye as needed (Dry eyes).    . predniSONE (DELTASONE) 10 MG tablet 10 day taper. 5,5,4,4,3,3,2,2,1,1 30 tablet 0  . Probiotic Product (ALIGN) 4 MG CAPS Take 4 mg by mouth daily.    . promethazine (PHENERGAN) 25 MG tablet Take 1 tablet (25 mg total) by mouth every 8 (eight) hours as needed for nausea or vomiting. 30 tablet 0  . Syringe/Needle, Disp, (SYRINGE 3CC/25GX1") 25G X 1" 3 ML MISC Use for b12 injections 50 each 0  . Syringe/Needle, Disp, (SYRINGE 3CC/25GX1") 25G X 1" 3 ML MISC Use for b12 injections 50 each 0  . valACYclovir (VALTREX) 500 MG tablet     . venlafaxine XR (EFFEXOR-XR) 150 MG 24 hr capsule Take 150 mg by mouth daily with breakfast.    . venlafaxine XR (EFFEXOR-XR) 37.5 MG 24  hr capsule TAKE 2 CAPSULES EVERY DAY WITH BREAKFAST 60 capsule 3  . zolmitriptan (ZOMIG-ZMT) 5 MG disintegrating tablet Take 5 mg by mouth as needed for migraine.     . famotidine (PEPCID) 40 MG tablet Take 1 tablet (40 mg total) by mouth every evening. (Patient not taking: Reported on 06/11/2018) 30 tablet 1  . [DISCONTINUED] estradiol (ESTRACE) 0.5 MG tablet Take 1 tablet (0.5 mg total) by mouth daily. 30 tablet 11   No current facility-administered medications on file prior to visit.    There are no Patient Instructions on file for this visit. No follow-ups on  file.   Kris Hartmann, NP

## 2021-03-27 ENCOUNTER — Other Ambulatory Visit (INDEPENDENT_AMBULATORY_CARE_PROVIDER_SITE_OTHER): Payer: Self-pay | Admitting: Nurse Practitioner

## 2021-03-27 DIAGNOSIS — M7989 Other specified soft tissue disorders: Secondary | ICD-10-CM

## 2021-03-30 ENCOUNTER — Ambulatory Visit (INDEPENDENT_AMBULATORY_CARE_PROVIDER_SITE_OTHER): Payer: BLUE CROSS/BLUE SHIELD | Admitting: Nurse Practitioner

## 2021-03-30 ENCOUNTER — Other Ambulatory Visit: Payer: Self-pay

## 2021-03-30 ENCOUNTER — Ambulatory Visit (INDEPENDENT_AMBULATORY_CARE_PROVIDER_SITE_OTHER): Payer: BLUE CROSS/BLUE SHIELD

## 2021-03-30 DIAGNOSIS — R600 Localized edema: Secondary | ICD-10-CM | POA: Diagnosis not present

## 2021-03-30 DIAGNOSIS — M7989 Other specified soft tissue disorders: Secondary | ICD-10-CM

## 2021-04-08 ENCOUNTER — Encounter (INDEPENDENT_AMBULATORY_CARE_PROVIDER_SITE_OTHER): Payer: Self-pay

## 2021-04-13 ENCOUNTER — Telehealth (INDEPENDENT_AMBULATORY_CARE_PROVIDER_SITE_OTHER): Payer: Self-pay | Admitting: Nurse Practitioner

## 2021-04-13 NOTE — Telephone Encounter (Signed)
Contacted the patient to discuss her ultrasound results.  Left voicemail for her to contact the office at her convenience otherwise I will try to reach out to her later time.

## 2022-09-27 ENCOUNTER — Encounter (INDEPENDENT_AMBULATORY_CARE_PROVIDER_SITE_OTHER): Payer: Self-pay
